# Patient Record
Sex: Female | Born: 1955 | Race: White | Hispanic: No | Marital: Married | Smoking: Never smoker
Health system: Southern US, Community
[De-identification: ages and names within clinical notes are randomized; demographics above are authoritative.]

## PROBLEM LIST (undated history)

## (undated) DIAGNOSIS — Z8619 Personal history of other infectious and parasitic diseases: Secondary | ICD-10-CM

## (undated) DIAGNOSIS — E7800 Pure hypercholesterolemia, unspecified: Secondary | ICD-10-CM

## (undated) DIAGNOSIS — C719 Malignant neoplasm of brain, unspecified: Secondary | ICD-10-CM

## (undated) DIAGNOSIS — J45909 Unspecified asthma, uncomplicated: Secondary | ICD-10-CM

## (undated) HISTORY — PX: LAPAROSCOPY: SHX197

## (undated) HISTORY — PX: FOOT SURGERY: SHX648

## (undated) HISTORY — DX: Personal history of other infectious and parasitic diseases: Z86.19

---

## 1982-02-02 HISTORY — PX: TUBAL LIGATION: SHX77

## 2005-08-20 ENCOUNTER — Ambulatory Visit: Payer: Self-pay

## 2006-09-15 ENCOUNTER — Ambulatory Visit: Payer: Self-pay

## 2007-02-03 HISTORY — PX: OTHER SURGICAL HISTORY: SHX169

## 2007-03-19 DIAGNOSIS — E7800 Pure hypercholesterolemia, unspecified: Secondary | ICD-10-CM

## 2007-03-19 DIAGNOSIS — G4700 Insomnia, unspecified: Secondary | ICD-10-CM | POA: Insufficient documentation

## 2007-03-19 DIAGNOSIS — J309 Allergic rhinitis, unspecified: Secondary | ICD-10-CM

## 2007-03-19 DIAGNOSIS — N951 Menopausal and female climacteric states: Secondary | ICD-10-CM

## 2007-03-19 DIAGNOSIS — Z803 Family history of malignant neoplasm of breast: Secondary | ICD-10-CM

## 2007-03-19 DIAGNOSIS — K219 Gastro-esophageal reflux disease without esophagitis: Secondary | ICD-10-CM

## 2007-03-23 ENCOUNTER — Ambulatory Visit: Payer: Self-pay | Admitting: Family Medicine

## 2007-11-30 ENCOUNTER — Ambulatory Visit: Payer: Self-pay | Admitting: Family Medicine

## 2008-10-15 DIAGNOSIS — E559 Vitamin D deficiency, unspecified: Secondary | ICD-10-CM

## 2008-12-11 ENCOUNTER — Ambulatory Visit: Payer: Self-pay | Admitting: Family Medicine

## 2009-04-23 LAB — HM PAP SMEAR: HM PAP: NEGATIVE

## 2009-07-31 ENCOUNTER — Ambulatory Visit: Payer: Self-pay | Admitting: Family Medicine

## 2009-08-02 ENCOUNTER — Ambulatory Visit: Payer: Self-pay

## 2010-01-14 ENCOUNTER — Ambulatory Visit: Payer: Self-pay | Admitting: Family Medicine

## 2011-03-25 ENCOUNTER — Ambulatory Visit: Payer: Self-pay | Admitting: Family Medicine

## 2012-04-05 ENCOUNTER — Ambulatory Visit: Payer: Self-pay | Admitting: Family Medicine

## 2012-08-22 ENCOUNTER — Ambulatory Visit: Payer: Self-pay | Admitting: Gastroenterology

## 2012-09-05 ENCOUNTER — Ambulatory Visit: Payer: Self-pay | Admitting: Gastroenterology

## 2012-09-05 LAB — HM COLONOSCOPY

## 2012-09-07 ENCOUNTER — Other Ambulatory Visit: Payer: Self-pay | Admitting: Gastroenterology

## 2012-09-07 LAB — PATHOLOGY REPORT

## 2013-07-27 ENCOUNTER — Ambulatory Visit: Payer: Self-pay | Admitting: Family Medicine

## 2013-07-27 LAB — HM MAMMOGRAPHY

## 2013-10-06 LAB — LIPID PANEL
Cholesterol: 201 mg/dL — AB (ref 0–200)
HDL: 67 mg/dL (ref 35–70)
LDL Cholesterol: 115 mg/dL
Triglycerides: 95 mg/dL (ref 40–160)

## 2013-10-06 LAB — HEPATIC FUNCTION PANEL
ALT: 27 U/L (ref 7–35)
AST: 24 U/L (ref 13–35)

## 2013-10-06 LAB — TSH: TSH: 1.66 u[IU]/mL (ref 0.41–5.90)

## 2014-05-12 ENCOUNTER — Emergency Department
Admit: 2014-05-12 | Disposition: A | Discharge status: Home / Self Care | Payer: Self-pay | Admitting: Physician Assistant

## 2014-06-21 ENCOUNTER — Other Ambulatory Visit: Payer: Self-pay | Admitting: Orthopedic Surgery

## 2014-06-21 DIAGNOSIS — G8929 Other chronic pain: Secondary | ICD-10-CM

## 2014-06-21 DIAGNOSIS — M2391 Unspecified internal derangement of right knee: Secondary | ICD-10-CM

## 2014-06-21 DIAGNOSIS — M25561 Pain in right knee: Principal | ICD-10-CM

## 2014-07-03 ENCOUNTER — Ambulatory Visit
Admission: RE | Admit: 2014-07-03 | Discharge: 2014-07-03 | Disposition: A | Discharge status: Home / Self Care | Payer: No Typology Code available for payment source | Source: Ambulatory Visit | Attending: Orthopedic Surgery | Admitting: Orthopedic Surgery

## 2014-07-03 DIAGNOSIS — M19071 Primary osteoarthritis, right ankle and foot: Secondary | ICD-10-CM | POA: Diagnosis not present

## 2014-07-03 DIAGNOSIS — M25561 Pain in right knee: Secondary | ICD-10-CM

## 2014-07-03 DIAGNOSIS — S83271A Complex tear of lateral meniscus, current injury, right knee, initial encounter: Secondary | ICD-10-CM | POA: Insufficient documentation

## 2014-07-03 DIAGNOSIS — M2391 Unspecified internal derangement of right knee: Secondary | ICD-10-CM

## 2014-07-03 DIAGNOSIS — M6751 Plica syndrome, right knee: Secondary | ICD-10-CM | POA: Insufficient documentation

## 2014-07-03 DIAGNOSIS — X58XXXA Exposure to other specified factors, initial encounter: Secondary | ICD-10-CM | POA: Insufficient documentation

## 2014-07-03 DIAGNOSIS — G8929 Other chronic pain: Secondary | ICD-10-CM

## 2014-07-06 ENCOUNTER — Other Ambulatory Visit: Payer: Self-pay | Admitting: *Deleted

## 2014-07-06 NOTE — Telephone Encounter (Signed)
Refill request for bupropion 300mg  Last filled by MD on- 03/09/2013 #90 x3 refills Last Appt: 03/22/2014 Next Appt: none Please advise refill? Bupropion is in pt's med list.

## 2014-07-08 MED ORDER — OMEPRAZOLE 20 MG PO CPDR: 20.0000 mg | DELAYED_RELEASE_CAPSULE | Freq: Every day | ORAL | Status: AC

## 2014-07-08 MED ORDER — ROSUVASTATIN CALCIUM 20 MG PO TABS: 20.0000 mg | ORAL_TABLET | Freq: Every day | ORAL | Status: DC

## 2014-07-08 MED ORDER — BUPROPION HCL ER (XL) 300 MG PO TB24: 300.0000 mg | ORAL_TABLET | Freq: Every day | ORAL | Status: AC

## 2014-07-25 ENCOUNTER — Telehealth: Payer: Self-pay | Admitting: Family Medicine

## 2014-07-25 NOTE — Telephone Encounter (Signed)
Recommend she change to atorvastatin (generic Lipitor) 40mg  one daily. If OK with patient can send rx for #90 with 1 refills to [facility].

## 2014-07-25 NOTE — Telephone Encounter (Signed)
Pt states [facility] is sending a refill request for Crestor 20mg .  Pt states the cost is going to $275.00 for 3 months (for name brand or generic) and she can not pay that.  Pt is requesting something different to be refilled that will cost less.  [facility] mail order.  CB#[phone]/MJ

## 2014-07-26 NOTE — Telephone Encounter (Signed)
Left message for pt to return call.

## 2014-07-27 MED ORDER — ATORVASTATIN CALCIUM 40 MG PO TABS: 40.0000 mg | ORAL_TABLET | Freq: Every day | ORAL | Status: DC

## 2014-07-27 NOTE — Telephone Encounter (Signed)
New rx sent to pharmacy

## 2014-07-27 NOTE — Telephone Encounter (Signed)
Pt stated we called her husband and she was calling back. I corrected pt's number and advised her of below and she would like the new RX sent to mail order and will call back if there is a problem. Thanks TNP

## 2014-08-02 ENCOUNTER — Other Ambulatory Visit: Payer: Self-pay | Admitting: Family Medicine

## 2014-08-02 DIAGNOSIS — J309 Allergic rhinitis, unspecified: Secondary | ICD-10-CM

## 2014-10-09 ENCOUNTER — Other Ambulatory Visit: Payer: Self-pay | Admitting: Family Medicine

## 2014-10-09 NOTE — Telephone Encounter (Signed)
Please call in alprazolam.  

## 2014-10-09 NOTE — Telephone Encounter (Signed)
Rx called in to pharmacy. 

## 2014-10-11 ENCOUNTER — Other Ambulatory Visit: Payer: Self-pay | Admitting: Family Medicine

## 2014-10-11 DIAGNOSIS — J329 Chronic sinusitis, unspecified: Secondary | ICD-10-CM

## 2014-10-12 ENCOUNTER — Other Ambulatory Visit: Payer: Self-pay | Admitting: *Deleted

## 2014-10-12 MED ORDER — BUSPIRONE HCL 5 MG PO TABS: 5.0000 mg | ORAL_TABLET | Freq: Three times a day (TID) | ORAL | Status: DC

## 2014-10-12 NOTE — Telephone Encounter (Signed)
Refill request for Buspirone 15 mg Last filled by MD on- 12/05/2013 #90 x3 Last Appt: 03/22/2014 Next Appt: none Please advise refill?

## 2014-10-19 ENCOUNTER — Other Ambulatory Visit: Payer: Self-pay | Admitting: *Deleted

## 2014-10-19 MED ORDER — BUSPIRONE HCL 5 MG PO TABS: 5.0000 mg | ORAL_TABLET | Freq: Three times a day (TID) | ORAL | Status: DC

## 2014-10-19 NOTE — Telephone Encounter (Signed)
Received fax requesting 90 day supply of buspirone HCL.

## 2014-10-29 ENCOUNTER — Telehealth: Payer: Self-pay | Admitting: Family Medicine

## 2014-10-29 NOTE — Telephone Encounter (Signed)
Pt sent e-mail request for busPIRone (BUSPAR) 5 MG tablet to be refilled at [facility]. It looks like it has been sent to 2 different pharmacy this month. Thanks TNP

## 2014-10-29 NOTE — Telephone Encounter (Signed)
Tried calling patient. No answer. Left message to call back. Looks like we sent in a 90 day supply on 10/19/2014 with an additional refill. Patient is not due for refill yet.

## 2014-10-30 NOTE — Telephone Encounter (Signed)
Pt returned call. Pt stated that she did pick up the busPIRone (BUSPAR) 5 MG tablet from [facility]. Pt stated she kept requesting it because she didn't know where it had been sent. Thanks TNP

## 2014-10-30 NOTE — Telephone Encounter (Signed)
Pt is returning a call back.  CB#[phone]/MW

## 2014-10-30 NOTE — Telephone Encounter (Signed)
Returned call, left message for pt to cb.

## 2014-12-10 ENCOUNTER — Other Ambulatory Visit: Payer: Self-pay | Admitting: Family Medicine

## 2014-12-10 NOTE — Telephone Encounter (Signed)
Please call in lorazepam.  

## 2014-12-10 NOTE — Telephone Encounter (Signed)
Rx called in to pharmacy. 

## 2015-01-16 ENCOUNTER — Other Ambulatory Visit: Payer: Self-pay | Admitting: Family Medicine

## 2015-01-17 NOTE — Telephone Encounter (Signed)
Please advise patient we sent 1 refill for atorvastatin to her mail order, but she is due for follow up o.v. And labs and needs to schedule within the next month or two.

## 2015-01-31 ENCOUNTER — Other Ambulatory Visit: Payer: Self-pay | Admitting: Family Medicine

## 2015-02-21 DIAGNOSIS — M545 Low back pain, unspecified: Secondary | ICD-10-CM | POA: Insufficient documentation

## 2015-02-21 DIAGNOSIS — F32A Depression, unspecified: Secondary | ICD-10-CM | POA: Insufficient documentation

## 2015-02-21 DIAGNOSIS — F329 Major depressive disorder, single episode, unspecified: Secondary | ICD-10-CM | POA: Insufficient documentation

## 2015-02-21 DIAGNOSIS — L219 Seborrheic dermatitis, unspecified: Secondary | ICD-10-CM | POA: Insufficient documentation

## 2015-02-21 DIAGNOSIS — F419 Anxiety disorder, unspecified: Secondary | ICD-10-CM | POA: Insufficient documentation

## 2015-02-22 ENCOUNTER — Ambulatory Visit (INDEPENDENT_AMBULATORY_CARE_PROVIDER_SITE_OTHER): Payer: BLUE CROSS/BLUE SHIELD | Admitting: Family Medicine

## 2015-02-22 ENCOUNTER — Encounter: Payer: Self-pay | Admitting: Family Medicine

## 2015-02-22 VITALS — BP 138/88 | HR 72 | Temp 98.3°F | Resp 18 | Wt 186.0 lb

## 2015-02-22 DIAGNOSIS — J0100 Acute maxillary sinusitis, unspecified: Secondary | ICD-10-CM | POA: Diagnosis not present

## 2015-02-22 MED ORDER — AMOXICILLIN-POT CLAVULANATE 875-125 MG PO TABS: 1.0000 | ORAL_TABLET | Freq: Two times a day (BID) | ORAL | Status: DC

## 2015-02-22 MED ORDER — HYDROCODONE-HOMATROPINE 5-1.5 MG/5ML PO SYRP: ORAL_SOLUTION | ORAL | Status: DC

## 2015-02-22 NOTE — Patient Instructions (Signed)
Continue Tylenol sinus for congestion.

## 2015-02-22 NOTE — Progress Notes (Signed)
Subjective:     Patient ID: Jessica [name], female   DOB: May 22, 1955, 60 y.o.   MRN: [id number]  HPI  Chief Complaint  Patient presents with  . Nasal Congestion    Patient states she has had cold symptoms for 3 weeks off and on. Symptoms include currently post nasal drainage, nasal congestion, sore throat, headache, sinus pressure, dry cough with very small productivity of phlegm, some SOB. No fever or chills. She has taking OTC medications for congestion and Tylenol sinuses.  . Cough  States congestion is locked in and little is draining.   Review of Systems     Objective:   Physical Exam  Constitutional: She appears well-developed and well-nourished. No distress.  Ears: T.M's intact without inflammation Sinuses: mild maxillary sinus tenderness Throat: no tonsillar enlargement or exudate Neck: no cervical adenopathy Lungs: clear     Assessment:    1. Acute maxillary sinusitis, recurrence not specified - HYDROcodone-homatropine (HYCODAN) 5-1.5 MG/5ML syrup; 5 ml 4-6 hours as needed for cough  Dispense: 240 mL; Refill: 0 - amoxicillin-clavulanate (AUGMENTIN) 875-125 MG tablet; Take 1 tablet by mouth 2 (two) times daily.  Dispense: 20 tablet; Refill: 0    Plan:    Continue otc decongestants.

## 2015-02-27 ENCOUNTER — Other Ambulatory Visit: Payer: Self-pay | Admitting: Family Medicine

## 2015-02-27 ENCOUNTER — Telehealth: Payer: Self-pay | Admitting: Family Medicine

## 2015-02-27 DIAGNOSIS — J0100 Acute maxillary sinusitis, unspecified: Secondary | ICD-10-CM

## 2015-02-27 MED ORDER — CEFDINIR 300 MG PO CAPS: 300.0000 mg | ORAL_CAPSULE | Freq: Two times a day (BID) | ORAL | Status: DC

## 2015-02-27 NOTE — Telephone Encounter (Signed)
Advised patient as stated below. KW

## 2015-02-27 NOTE — Telephone Encounter (Signed)
I have sent in cefdinir antibiotic and have noted the reaction to Augmentin in her chart.

## 2015-02-27 NOTE — Telephone Encounter (Signed)
Pt states she was in on Friday and rec'd there Rx amoxicillin-clavulanate (AUGMENTIN) 875-125 MG tablet.  Pt states the Rx has made her sick.  Pt states she took 1 on Friday night and Saturday morning.  Pt was having nausea Saturday morning.  Pt states she started throwing up Saturday afternoon until Monday morning.  Pt says this medication has made her feel like this before.  Pt is requesting a different Rx.  Pt is still having sinus congestion and drainage.  [facility].  [phone]

## 2015-03-01 ENCOUNTER — Encounter: Payer: Self-pay | Admitting: Family Medicine

## 2015-03-01 ENCOUNTER — Ambulatory Visit (INDEPENDENT_AMBULATORY_CARE_PROVIDER_SITE_OTHER): Payer: BLUE CROSS/BLUE SHIELD | Admitting: Family Medicine

## 2015-03-01 VITALS — BP 126/72 | HR 68 | Temp 98.0°F | Resp 18 | Ht 65.0 in | Wt 179.0 lb

## 2015-03-01 DIAGNOSIS — F419 Anxiety disorder, unspecified: Secondary | ICD-10-CM | POA: Diagnosis not present

## 2015-03-01 DIAGNOSIS — R319 Hematuria, unspecified: Secondary | ICD-10-CM | POA: Diagnosis not present

## 2015-03-01 DIAGNOSIS — E7800 Pure hypercholesterolemia, unspecified: Secondary | ICD-10-CM

## 2015-03-01 DIAGNOSIS — G4700 Insomnia, unspecified: Secondary | ICD-10-CM

## 2015-03-01 DIAGNOSIS — Z0000 Encounter for general adult medical examination without abnormal findings: Secondary | ICD-10-CM

## 2015-03-01 DIAGNOSIS — F329 Major depressive disorder, single episode, unspecified: Secondary | ICD-10-CM

## 2015-03-01 DIAGNOSIS — F32A Depression, unspecified: Secondary | ICD-10-CM

## 2015-03-01 LAB — POCT URINALYSIS DIPSTICK
BILIRUBIN UA: NEGATIVE
GLUCOSE UA: NEGATIVE
Ketones, UA: NEGATIVE
LEUKOCYTES UA: NEGATIVE
NITRITE UA: NEGATIVE
Protein, UA: NEGATIVE
Spec Grav, UA: 1.02
Urobilinogen, UA: 0.2
pH, UA: 6

## 2015-03-01 MED ORDER — BUSPIRONE HCL 5 MG PO TABS: 5.0000 mg | ORAL_TABLET | Freq: Three times a day (TID) | ORAL | Status: DC

## 2015-03-01 MED ORDER — ZOLPIDEM TARTRATE 5 MG PO TABS: 5.0000 mg | ORAL_TABLET | Freq: Every evening | ORAL | Status: DC | PRN

## 2015-03-01 MED ORDER — BUSPIRONE HCL 5 MG PO TABS: 5.0000 mg | ORAL_TABLET | Freq: Three times a day (TID) | ORAL | Status: AC

## 2015-03-01 NOTE — Progress Notes (Signed)
Patient ID: Jessica [name], female   DOB: 29-Dec-1955, 60 y.o.   MRN: [id number]       Patient: Jessica [name], Female    DOB: 1955-04-06, 60 y.o.   MRN: [id number] Visit Date: 03/01/2015  Today's Provider: Lelon Huh, MD   Chief Complaint  Patient presents with  . Annual Exam   Subjective:    Annual physical exam Jessica [name] is a 60 y.o. female who presents today for health maintenance and complete physical. She feels fairly well. She reports exercising not regularly. She reports she is sleeping fairly well. She sleeps on average 6 hours a night.    Depression, Follow-up  She  was last seen for this 1 years ago. Changes made at last visit include decreasing Buspar from 20mg  to 15mg , and take 15mg  TID instead of BID.   She reports good compliance with treatment. She is not having side effects.   She reports good tolerance of treatment. Current symptoms include: none She feels she is Unchanged since last visit.   Lipid/Cholesterol, Follow-up:   Last seen for this1 years ago.  Management changes since that visit include no changes. . Last Lipid Panel:    Component Value Date/Time   CHOL 201* 10/06/2013   TRIG 95 10/06/2013   HDL 67 10/06/2013   LDLCALC 115 10/06/2013    She reports good compliance with treatment. She is not having side effects.  Weight trend: stable Prior visit with dietician: no Current diet: well balanced Current exercise: none  Wt Readings from Last 3 Encounters:  03/01/15 179 lb (81.194 kg)  02/22/15 186 lb (84.369 kg)  03/22/14 190 lb (86.183 kg)    ------------------------------------------------------------------- Insomnia, Follow up:  On last visit (12/05/2013) Alprazolam was D/C and patient was started on Lorazepam 1mg  at bedtime. Patient reports that she thinks the medication is keeping her up at night. She has never Ambien, but is interested.   URI symptoms: Patient reports that she is currently on an abx due to having URI  symptoms. She reports that she just stared Cefdinir 2 days ago. She reports that symptoms are better, but she is not 100%. Patient is requesting something to help with the PND she is having.   Review of Systems  Constitutional: Negative.   HENT: Positive for congestion, postnasal drip and sinus pressure.   Eyes: Negative.   Respiratory: Positive for cough.   Cardiovascular: Negative.   Gastrointestinal: Negative.   Endocrine: Negative.   Genitourinary: Negative.   Musculoskeletal: Negative.   Skin: Negative.   Allergic/Immunologic: Negative.   Neurological: Negative.   Hematological: Negative.   Psychiatric/Behavioral: Positive for sleep disturbance.    Social History      She  reports that she has never smoked. She has never used smokeless tobacco. She reports that she drinks alcohol. She reports that she does not use illicit drugs.       Social History   Social History  . Marital Status: Married    Spouse Name: N/A  . Number of Children: 2  . Years of Education: N/A   Occupational History  . Manager     [employer]   Social History Main Topics  . Smoking status: Never Smoker   . Smokeless tobacco: Never Used  . Alcohol Use: 0.0 oz/week    0 Standard drinks or equivalent per week     Comment: occasional use  . Drug Use: No  . Sexual Activity: Not Asked   Other Topics Concern  .  None   Social History Narrative    Past Medical History  Diagnosis Date  . History of chicken pox   . History of measles   . History of mumps   . Hyperlipidemia   . Vitamin D deficiency   . Depression   . Anxiety   . Allergy   . GERD (gastroesophageal reflux disease)      Patient Active Problem List   Diagnosis Date Noted  . Anxiety 02/21/2015  . Depression 02/21/2015  . 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 Vitamin D deficiency 10/15/2008  . Allergic rhinitis 03/19/2007  . Esophageal reflux 03/19/2007  . Family history of breast cancer 03/19/2007  .  Hypercholesteremia 03/19/2007  . Insomnia 03/19/2007  . Menopausal symptom 03/19/2007    Past Surgical History  Procedure Laterality Date  . Tubal ligation  1984  . Laparoscopy      abdominal endometriosis  . Ct scan of head  2009    negative  . Foot surgery      Family History        Family Status  Relation Status Death Age  . Mother Alive   . Father Alive   . Sister Deceased 40    unknown cause. MVA age 71, major fractures in pelvis and legs. OA  . Brother Alive   . Sister Alive         Her family history includes Breast cancer in her mother; Depression in her mother; Diabetes in her father; Hyperlipidemia in her mother; Hypertension in her brother, father, mother, and sister; Hypothyroidism in her mother.    Allergies  Allergen Reactions  . Augmentin [Amoxicillin-Pot Clavulanate] Nausea And Vomiting  . Compazine  [Prochlorperazine]     swelling, SOB.    Previous Medications   ATORVASTATIN (LIPITOR) 40 MG TABLET    TAKE 1 TABLET DAILY   BUPROPION (WELLBUTRIN XL) 300 MG 24 HR TABLET    Take 1 tablet (300 mg total) by mouth daily.   BUSPIRONE (BUSPAR) 5 MG TABLET    Take 1 tablet (5 mg total) by mouth 3 (three) times daily.   CEFDINIR (OMNICEF) 300 MG CAPSULE    Take 1 capsule (300 mg total) by mouth 2 (two) times daily.   FLUTICASONE (FLONASE) 50 MCG/ACT NASAL SPRAY    SHAKE LIQUID AND USE 2 SPRAYS IN EACH NOSTRIL DAILY   HYDROCODONE-HOMATROPINE (HYCODAN) 5-1.5 MG/5ML SYRUP    5 ml 4-6 hours as needed for cough   LORAZEPAM (ATIVAN) 1 MG TABLET    TAKE ONE TABLET AT BEDTIME   OMEPRAZOLE (PRILOSEC) 20 MG CAPSULE    Take 1 capsule (20 mg total) by mouth daily.   VITAMIN D, CHOLECALCIFEROL, 1000 UNITS TABS    Take by mouth.    Patient Care Team: Birdie Sons, MD as PCP - General (Family Medicine) Historical Provider, MD Barrie Dunker, MD (Dermatology)     Objective:   Vitals: Ht 5\' 5"  (1.651 m)  Wt 179 lb (81.194 kg)  BMI 29.79 kg/m2   Physical  Exam  General Appearance:    Alert, cooperative, no distress, appears stated age  Head:    Normocephalic, without obvious abnormality, atraumatic  Eyes:    PERRL, conjunctiva/corneas clear, EOM's intact, fundi    benign, both eyes  Ears:    Normal TM's and external ear canals, both ears  Nose:   Nares normal, septum midline, mucosa normal, no drainage    or sinus tenderness  Throat:   Lips, mucosa, and tongue normal;  teeth and gums normal  Neck:   Supple, symmetrical, trachea midline, no adenopathy;    thyroid:  no enlargement/tenderness/nodules; no carotid   bruit or JVD  Back:     Symmetric, no curvature, ROM normal, no CVA tenderness  Lungs:     Clear to auscultation bilaterally, respirations unlabored  Chest Wall:    No tenderness or deformity   Heart:    Regular rate and rhythm, S1 and S2 normal, no murmur, rub   or gallop  Breast Exam:    normal appearance, no masses or tenderness  Abdomen:     Soft, non-tender, bowel sounds active all four quadrants,    no masses, no organomegaly  Pelvic:    deferred  Extremities:   Extremities normal, atraumatic, no cyanosis or edema  Pulses:   2+ and symmetric all extremities  Skin:   Skin color, texture, turgor normal, no rashes or lesions  Lymph nodes:   Cervical, supraclavicular, and axillary nodes normal  Neurologic:   CNII-XII intact, normal strength, sensation and reflexes    throughout      Assessment & Plan:     Routine Health Maintenance and Physical Exam  Exercise Activities and Dietary recommendations Goals    None      Immunization History  Administered Date(s) Administered  . Influenza-Unspecified 12/04/2014  . Tdap 04/10/2008    Health Maintenance  Topic Date Due  . HIV Screening  08/01/1969  . PAP SMEAR  04/23/2012  . ZOSTAVAX  08/02/2014  . MAMMOGRAM  07/28/2015  . INFLUENZA VACCINE  09/03/2015  . TETANUS/TDAP  04/11/2018  . COLONOSCOPY  09/06/2022  . Hepatitis C Screening  Completed       Discussed health benefits of physical activity, and encouraged her to engage in regular exercise appropriate for her age and condition.    --------------------------------------------------------------------  1. Annual physical exam Counseled on diet and exercise to improve overall health She was given prescription for Zostavax since we have none in stock in the office.   2. Anxiety Doing well with buspirone which is refilled today - CBC with Differential/Platelet - TSH  3. Depression Continue Wellbutrin - CBC with Differential/Platelet  4. Hypercholesteremia She is tolerating atorvastatin well with no adverse effects.   - Lipid panel - CBC with Differential/Platelet - Basic Metabolic Panel (BMET)  5. Insomnia Try change lorazepam to Ambien  - TSH - zolpidem (AMBIEN) 5 MG tablet; Take 1 tablet (5 mg total) by mouth at bedtime as needed (insomnia).  Dispense: 20 tablet; Refill: 1  6. Hematuria She complains of frequent urination and urgency, but u/a shows only RBCs. Check Culture.   - POCT urinalysis dipstick - Urine Culture

## 2015-03-02 LAB — LIPID PANEL
CHOL/HDL RATIO: 3.9 ratio (ref 0.0–4.4)
Cholesterol, Total: 178 mg/dL (ref 100–199)
HDL: 46 mg/dL (ref >39)
LDL CALC: 106 mg/dL — AB (ref 0–99)
Triglycerides: 131 mg/dL (ref 0–149)
VLDL CHOLESTEROL CAL: 26 mg/dL (ref 5–40)

## 2015-03-02 LAB — TSH: TSH: 1.11 u[IU]/mL (ref 0.450–4.500)

## 2015-03-02 LAB — CBC WITH DIFFERENTIAL/PLATELET
BASOS: 0 %
Basophils Absolute: 0 10*3/uL (ref 0.0–0.2)
EOS (ABSOLUTE): 0.1 10*3/uL (ref 0.0–0.4)
EOS: 2 %
HEMATOCRIT: 42.9 % (ref 34.0–46.6)
HEMOGLOBIN: 14.6 g/dL (ref 11.1–15.9)
Immature Grans (Abs): 0 10*3/uL (ref 0.0–0.1)
Immature Granulocytes: 0 %
LYMPHS ABS: 1.8 10*3/uL (ref 0.7–3.1)
Lymphs: 29 %
MCH: 29.9 pg (ref 26.6–33.0)
MCHC: 34 g/dL (ref 31.5–35.7)
MCV: 88 fL (ref 79–97)
MONOCYTES: 7 %
Monocytes Absolute: 0.4 10*3/uL (ref 0.1–0.9)
NEUTROS ABS: 3.8 10*3/uL (ref 1.4–7.0)
Neutrophils: 62 %
Platelets: 292 10*3/uL (ref 150–379)
RBC: 4.89 x10E6/uL (ref 3.77–5.28)
RDW: 13.5 % (ref 12.3–15.4)
WBC: 6.1 10*3/uL (ref 3.4–10.8)

## 2015-03-02 LAB — URINE CULTURE: ORGANISM ID, BACTERIA: NO GROWTH

## 2015-03-02 LAB — BASIC METABOLIC PANEL
BUN / CREAT RATIO: 7 — AB (ref 11–26)
BUN: 6 mg/dL — AB (ref 8–27)
CO2: 25 mmol/L (ref 18–29)
CREATININE: 0.81 mg/dL (ref 0.57–1.00)
Calcium: 9.8 mg/dL (ref 8.7–10.3)
Chloride: 99 mmol/L (ref 96–106)
GFR calc Af Amer: 91 mL/min/{1.73_m2} (ref >59)
GFR calc non Af Amer: 79 mL/min/{1.73_m2} (ref >59)
GLUCOSE: 96 mg/dL (ref 65–99)
Potassium: 4.3 mmol/L (ref 3.5–5.2)
Sodium: 142 mmol/L (ref 134–144)

## 2015-03-13 ENCOUNTER — Other Ambulatory Visit: Payer: Self-pay

## 2015-03-13 DIAGNOSIS — R319 Hematuria, unspecified: Secondary | ICD-10-CM

## 2015-03-14 LAB — URINALYSIS
Bilirubin, UA: NEGATIVE
GLUCOSE, UA: NEGATIVE
KETONES UA: NEGATIVE
Nitrite, UA: NEGATIVE
Protein, UA: NEGATIVE
RBC, UA: NEGATIVE
Specific Gravity, UA: 1.014 (ref 1.005–1.030)
UUROB: 0.2 mg/dL (ref 0.2–1.0)
pH, UA: 6 (ref 5.0–7.5)

## 2015-03-15 LAB — URINALYSIS, MICROSCOPIC ONLY
Bacteria, UA: NONE SEEN
Casts: NONE SEEN /lpf
WBC UA: NONE SEEN /HPF (ref 0–?)

## 2015-03-28 ENCOUNTER — Other Ambulatory Visit: Payer: Self-pay | Admitting: Family Medicine

## 2015-03-28 ENCOUNTER — Other Ambulatory Visit: Payer: Self-pay | Admitting: *Deleted

## 2015-03-28 DIAGNOSIS — G4700 Insomnia, unspecified: Secondary | ICD-10-CM

## 2015-03-28 MED ORDER — ZOLPIDEM TARTRATE 5 MG PO TABS: 5.0000 mg | ORAL_TABLET | Freq: Every evening | ORAL | Status: DC | PRN

## 2015-03-28 MED ORDER — ZOLPIDEM TARTRATE 5 MG PO TABS
5.0000 mg | ORAL_TABLET | Freq: Every evening | ORAL | Status: DC | PRN
Start: 2015-03-28 — End: 2015-04-16

## 2015-04-11 ENCOUNTER — Other Ambulatory Visit: Payer: Self-pay | Admitting: Family Medicine

## 2015-04-11 DIAGNOSIS — G4700 Insomnia, unspecified: Secondary | ICD-10-CM

## 2015-04-11 NOTE — Telephone Encounter (Signed)
Pt states the pharmacy has told her the Rx zolpidem (AMBIEN) 5 MG tablet is not covered and will need a prior auth.  The Rx is with [facility].  Phone [phone].  Pt only has 6/7 pills left.  CB#[phone]/MW

## 2015-04-11 NOTE — Telephone Encounter (Signed)
PA started

## 2015-04-12 ENCOUNTER — Ambulatory Visit (INDEPENDENT_AMBULATORY_CARE_PROVIDER_SITE_OTHER): Payer: BLUE CROSS/BLUE SHIELD | Admitting: Family Medicine

## 2015-04-12 DIAGNOSIS — Z23 Encounter for immunization: Secondary | ICD-10-CM

## 2015-04-15 NOTE — Telephone Encounter (Signed)
Patient called wanting an update on this PA. I advised her that the PA had already been started. Have you heard back from Insurance on whether PA had been approved?

## 2015-04-16 NOTE — Telephone Encounter (Signed)
Pt called to check on PA.  I spoke to [place] and she said Jessica [name] was working on the [place].  Patient said she may run out and would like to know what she should do if she runs out before it gets approved?  Please advise.  Her call back is [phone]   Thanks Con Memos

## 2015-04-16 NOTE — Telephone Encounter (Signed)
Spoke with patient. She states she will run out of medication on Friday night. Sharyn Lull, have you heard if this medication has been approved?

## 2015-04-16 NOTE — Telephone Encounter (Signed)
PA approved for zolpidem. Approval [id number]. [facility] day supply. Pt is requesting an rx also be [facility], if not a months supply, then enough to last her until [facility]. Patient will be out of medication by Friday.

## 2015-04-18 MED ORDER — ZOLPIDEM TARTRATE 5 MG PO TABS
5.0000 mg | ORAL_TABLET | Freq: Every evening | ORAL | Status: DC | PRN
Start: 2015-04-18 — End: 2015-05-17

## 2015-04-18 NOTE — Telephone Encounter (Signed)
Rx called into pharmacy. Patient was notified.   

## 2015-04-18 NOTE — Telephone Encounter (Signed)
Please call in 30 day rx to [facility]

## 2015-05-17 ENCOUNTER — Other Ambulatory Visit: Payer: Self-pay | Admitting: Family Medicine

## 2015-05-17 NOTE — Telephone Encounter (Signed)
Please call in alprazolam.  

## 2015-05-18 NOTE — Telephone Encounter (Signed)
Please call in zolpidem  

## 2015-05-20 NOTE — Telephone Encounter (Signed)
Rx called in to pharmacy. 

## 2015-05-29 ENCOUNTER — Other Ambulatory Visit: Payer: Self-pay | Admitting: Internal Medicine

## 2015-05-29 DIAGNOSIS — Z1231 Encounter for screening mammogram for malignant neoplasm of breast: Secondary | ICD-10-CM

## 2015-06-10 ENCOUNTER — Ambulatory Visit: Payer: No Typology Code available for payment source

## 2015-06-12 ENCOUNTER — Ambulatory Visit
Admission: RE | Admit: 2015-06-12 | Discharge: 2015-06-12 | Disposition: A | Discharge status: Home / Self Care | Payer: BLUE CROSS/BLUE SHIELD | Source: Ambulatory Visit | Attending: Internal Medicine | Admitting: Internal Medicine

## 2015-06-12 DIAGNOSIS — Z1231 Encounter for screening mammogram for malignant neoplasm of breast: Secondary | ICD-10-CM | POA: Diagnosis present

## 2016-06-18 ENCOUNTER — Encounter: Payer: Self-pay | Admitting: Emergency Medicine

## 2016-06-18 ENCOUNTER — Emergency Department: Payer: BLUE CROSS/BLUE SHIELD

## 2016-06-18 ENCOUNTER — Emergency Department
Admission: EM | Admit: 2016-06-18 | Discharge: 2016-06-19 | Disposition: A | Discharge status: Other Acute Inpatient Hospital | Payer: BLUE CROSS/BLUE SHIELD | Attending: Emergency Medicine | Admitting: Emergency Medicine

## 2016-06-18 DIAGNOSIS — J45909 Unspecified asthma, uncomplicated: Secondary | ICD-10-CM | POA: Insufficient documentation

## 2016-06-18 DIAGNOSIS — Z79899 Other long term (current) drug therapy: Secondary | ICD-10-CM | POA: Diagnosis not present

## 2016-06-18 DIAGNOSIS — R110 Nausea: Secondary | ICD-10-CM | POA: Insufficient documentation

## 2016-06-18 DIAGNOSIS — D432 Neoplasm of uncertain behavior of brain, unspecified: Secondary | ICD-10-CM | POA: Insufficient documentation

## 2016-06-18 DIAGNOSIS — Z7902 Long term (current) use of antithrombotics/antiplatelets: Secondary | ICD-10-CM | POA: Insufficient documentation

## 2016-06-18 DIAGNOSIS — R51 Headache: Secondary | ICD-10-CM | POA: Diagnosis present

## 2016-06-18 DIAGNOSIS — C719 Malignant neoplasm of brain, unspecified: Secondary | ICD-10-CM

## 2016-06-18 HISTORY — DX: Pure hypercholesterolemia, unspecified: E78.00

## 2016-06-18 HISTORY — DX: Unspecified asthma, uncomplicated: J45.909

## 2016-06-18 LAB — COMPREHENSIVE METABOLIC PANEL
ALBUMIN: 4.4 g/dL (ref 3.5–5.0)
ALT: 22 U/L (ref 14–54)
AST: 20 U/L (ref 15–41)
Alkaline Phosphatase: 110 U/L (ref 38–126)
Anion gap: 9 (ref 5–15)
BUN: 9 mg/dL (ref 6–20)
CO2: 29 mmol/L (ref 22–32)
Calcium: 9.3 mg/dL (ref 8.9–10.3)
Chloride: 102 mmol/L (ref 101–111)
Creatinine, Ser: 0.95 mg/dL (ref 0.44–1.00)
GFR calc Af Amer: >60 mL/min (ref >60)
GFR calc non Af Amer: >60 mL/min (ref >60)
GLUCOSE: 105 mg/dL — AB (ref 65–99)
POTASSIUM: 3.7 mmol/L (ref 3.5–5.1)
Sodium: 140 mmol/L (ref 135–145)
Total Bilirubin: 1.5 mg/dL — ABNORMAL HIGH (ref 0.3–1.2)
Total Protein: 7.4 g/dL (ref 6.5–8.1)

## 2016-06-18 LAB — CBC WITH DIFFERENTIAL/PLATELET
Basophils Absolute: 0.1 10*3/uL (ref 0–0.1)
Basophils Relative: 1 %
EOS PCT: 0 %
Eosinophils Absolute: 0 10*3/uL (ref 0–0.7)
HEMATOCRIT: 41 % (ref 35.0–47.0)
Hemoglobin: 14 g/dL (ref 12.0–16.0)
LYMPHS ABS: 1.2 10*3/uL (ref 1.0–3.6)
LYMPHS PCT: 10 %
MCH: 29.5 pg (ref 26.0–34.0)
MCHC: 34.1 g/dL (ref 32.0–36.0)
MCV: 86.5 fL (ref 80.0–100.0)
MONO ABS: 0.4 10*3/uL (ref 0.2–0.9)
Monocytes Relative: 4 %
Neutro Abs: 10.9 10*3/uL — ABNORMAL HIGH (ref 1.4–6.5)
Neutrophils Relative %: 85 %
PLATELETS: 258 10*3/uL (ref 150–440)
RBC: 4.74 MIL/uL (ref 3.80–5.20)
RDW: 13.4 % (ref 11.5–14.5)
WBC: 12.6 10*3/uL — AB (ref 3.6–11.0)

## 2016-06-18 LAB — PROTIME-INR
INR: 1.08
Prothrombin Time: 14 seconds (ref 11.4–15.2)

## 2016-06-18 MED ORDER — DEXAMETHASONE SODIUM PHOSPHATE 10 MG/ML IJ SOLN
10.0000 mg | Freq: Once | INTRAMUSCULAR | Status: AC
  Administered 2016-06-18: 10 mg via INTRAVENOUS
  Filled 2016-06-18 (×2): qty 1

## 2016-06-18 MED ORDER — DIPHENHYDRAMINE HCL 50 MG/ML IJ SOLN
50.0000 mg | Freq: Once | INTRAMUSCULAR | Status: AC
  Administered 2016-06-18: 50 mg via INTRAVENOUS
  Filled 2016-06-18: qty 1

## 2016-06-18 MED ORDER — METOCLOPRAMIDE HCL 5 MG/ML IJ SOLN
10.0000 mg | Freq: Once | INTRAMUSCULAR | Status: AC
  Administered 2016-06-18: 10 mg via INTRAVENOUS
  Filled 2016-06-18: qty 2

## 2016-06-18 MED ORDER — SODIUM CHLORIDE 0.9 % IV SOLN
1000.0000 mg | Freq: Once | INTRAVENOUS | Status: AC
  Administered 2016-06-18: 1000 mg via INTRAVENOUS
  Filled 2016-06-18: qty 10

## 2016-06-18 MED ORDER — KETOROLAC TROMETHAMINE 30 MG/ML IJ SOLN
15.0000 mg | Freq: Once | INTRAMUSCULAR | Status: AC
  Administered 2016-06-18: 15 mg via INTRAVENOUS
  Filled 2016-06-18: qty 1

## 2016-06-18 NOTE — ED Provider Notes (Signed)
[facility] Emergency Department Provider Note  ____________________________________________   First MD Initiated Contact with Patient 06/18/16 2000     (approximate)  I have reviewed the triage vital signs and the nursing notes.   HISTORY  Chief Complaint Headache and Nausea    HPI Jessica [name] is a 61 y.o. female who self presents to the emergency Department with roughly 24 hours of gradual onset not maximal onset right sided throbbing headache unlike any headache she's ever had before. She has tried Excedrin Migraine at home with no relief. She's had nausea and vomiting. She has photophobia. She's had no fevers or chills. No numbness or weakness.   Past Medical History:  Diagnosis Date  . Asthma   . Elevated cholesterol   . History of chicken pox   . History of measles   . History of mumps     Patient Active Problem List   Diagnosis Date Noted  . Anxiety 02/21/2015  . Depression 02/21/2015  . 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 Vitamin D deficiency 10/15/2008  . Allergic rhinitis 03/19/2007  . Esophageal reflux 03/19/2007  . Family history of breast cancer 03/19/2007  . Hypercholesteremia 03/19/2007  . Insomnia 03/19/2007  . Menopausal symptom 03/19/2007    Past Surgical History:  Procedure Laterality Date  . CT Scan of head  2009   negative  . FOOT SURGERY    . LAPAROSCOPY     abdominal endometriosis  . TUBAL LIGATION  1984    Prior to Admission medications   Medication Sig Start Date End Date Taking? Authorizing Provider  atorvastatin (LIPITOR) 40 MG tablet TAKE 1 TABLET DAILY 03/28/15   Birdie Sons, MD  buPROPion (WELLBUTRIN XL) 300 MG 24 hr tablet Take 1 tablet (300 mg total) by mouth daily. 07/08/14   Birdie Sons, MD  busPIRone (BUSPAR) 5 MG tablet Take 1 tablet (5 mg total) by mouth 3 (three) times daily. 03/01/15   Birdie Sons, MD  cefdinir (OMNICEF) 300 MG capsule Take 1 capsule (300 mg total) by mouth 2  (two) times daily. 02/27/15   Carmon Ginsberg, PA  fluticasone (FLONASE) 50 MCG/ACT nasal spray SHAKE LIQUID AND USE 2 SPRAYS IN [facility] NOSTRIL DAILY 01/31/15   Birdie Sons, MD  HYDROcodone-homatropine [facility]) 5-1.5 MG/5ML syrup 5 ml 4-6 hours as needed for cough 02/22/15   Carmon Ginsberg, PA  omeprazole (PRILOSEC) 20 MG capsule Take 1 capsule (20 mg total) by mouth daily. 07/08/14   Birdie Sons, MD  Vitamin D, Cholecalciferol, 1000 units TABS Take by mouth.    [provider]  zolpidem (AMBIEN) 5 MG tablet TAKE 1 TABLET BY MOUTH EVERY NIGHT AT BEDTIME AS NEEDED FOR INSOMNIA 05/18/15   Birdie Sons, MD    Allergies Augmentin [amoxicillin-pot clavulanate] and Compazine  [prochlorperazine]  Family History  Problem Relation Age of Onset  . Breast cancer Mother 53  . Depression Mother   . Hyperlipidemia Mother   . Hypothyroidism Mother   . Hypertension Mother   . Diabetes Father   . Hypertension Father   . Hypertension Brother   . Hypertension Sister     Social History Social History  Substance Use Topics  . Smoking status: Never Smoker  . Smokeless tobacco: Never Used  . Alcohol use 0.0 oz/week     Comment: occasional use    Review of Systems Constitutional: No fever/chills Eyes: No visual changes. ENT: No sore throat. Cardiovascular: Denies chest pain. Respiratory: Denies shortness of  breath. Gastrointestinal: No abdominal pain.  No nausea, no vomiting.  No diarrhea.  No constipation. Genitourinary: Negative for dysuria. Musculoskeletal: Negative for back pain. Skin: Negative for rash. Neurological: Positive for headaches   ____________________________________________   PHYSICAL EXAM:  VITAL SIGNS: ED Triage Vitals  Enc Vitals Group     BP 06/18/16 1846 131/73     Pulse Rate 06/18/16 1846 81     Resp 06/18/16 1846 18     Temp 06/18/16 1846 98.3 F (36.8 C)     Temp Source 06/18/16 1846 Oral     SpO2 06/18/16 1846 98 %     Weight 06/18/16  1847 180 lb (81.6 kg)     Height 06/18/16 1847 5\' 5"  (1.651 m)     Head Circumference --      Peak Flow --      Pain Score 06/18/16 1846 10     Pain Loc --      Pain Edu? --      Excl. in [place]? --     Constitutional: Alert and oriented x 4 Appears quite uncomfortable hiding herself under the blanket Eyes: PERRL EOMI. Head: Atraumatic. Nose: No congestion/rhinnorhea. Mouth/Throat: No trismus Neck: No stridor.   Cardiovascular: Normal rate, regular rhythm. Grossly normal heart sounds.  Good peripheral circulation. Respiratory: Normal respiratory effort.  No retractions. Lungs CTAB and moving good air Gastrointestinal: Soft nontender Musculoskeletal: No lower extremity edema   Neurologic:  Normal speech and language. No gross focal neurologic deficits are appreciated. No pronator drift 5 out of 5 grips biceps triceps hip flexion and hip extension plantar flexion dorsiflexion Skin:  Skin is warm, dry and intact. No rash noted. Psychiatric: Mood and affect are normal. Speech and behavior are normal.    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  Critical care was necessary to treat or prevent imminent or life-threatening deterioration.  Critical care was time spent personally by me on the following activities: development of treatment plan with patient and/or surrogate as well as nursing, discussions with consultants, evaluation of patient's response to treatment, examination of patient, obtaining history from patient or surrogate, ordering and performing treatments and interventions, ordering and review of laboratory studies, ordering and review of radiographic studies, pulse oximetry and re-evaluation of patient's condition.   Observation: no ____________________________________________   INITIAL IMPRESSION / ASSESSMENT AND PLAN / ED COURSE  Pertinent labs & imaging results that were available during my care of the patient were reviewed by me and considered in my medical decision making (see chart for details).  While the patient is neuro intact she is in a significant amount of discomfort within the headache. I think she warrants a CT scan of her head to evaluate for large mass versus intracerebral hemorrhage etc. Had an adverse reaction to Compazine in the past so I will treat her  with Reglan instead.     Unfortunately the patient's head CT shows a large right sided brain tumor with midline shift and vasogenic edema. The scan is concerning for glioblastoma. I discussed the case with on-call neurosurgeon Dr. Cari Caraway who recommends dexamethasone as well as Keppra and having the patient transferred to [facility] emergently tonight for further workup and evaluation. I discussed this with the patient and her husband at bedside and they both agree. She remains neuro intact at this time. ____________________________________________   FINAL CLINICAL IMPRESSION(S) / ED DIAGNOSES  Final diagnoses:  Malignant neoplasm of brain, unspecified location [facility])      NEW MEDICATIONS STARTED DURING THIS VISIT:  New  Prescriptions   No medications on file     Note:  This document was prepared using Dragon voice recognition software and may include unintentional dictation errors.     Darel Hong, MD 06/18/16 2114

## 2016-06-18 NOTE — ED Triage Notes (Signed)
Pt reports right sided headache, nausea and sensitivity to light for 24 hours. Pt denies history of migraine headache. Pt alert and oriented in triage. Facial symmetry intact, bilateral strong hand grips, no arm drift noted.

## 2016-06-18 NOTE — ED Notes (Signed)
Patient transported to CT 

## 2016-06-18 NOTE — ED Notes (Signed)
Per MD Rifenbark, let MD Dahlia Client know when trasnfer is within 1 hour of picking up pt to be trasnferred

## 2016-07-07 ENCOUNTER — Emergency Department: Payer: BLUE CROSS/BLUE SHIELD

## 2016-07-07 ENCOUNTER — Encounter: Payer: Self-pay | Admitting: Emergency Medicine

## 2016-07-07 ENCOUNTER — Emergency Department
Admission: EM | Admit: 2016-07-07 | Discharge: 2016-07-07 | Disposition: A | Discharge status: Home / Self Care | Payer: BLUE CROSS/BLUE SHIELD | Attending: Emergency Medicine | Admitting: Emergency Medicine

## 2016-07-07 DIAGNOSIS — Z9889 Other specified postprocedural states: Secondary | ICD-10-CM | POA: Diagnosis not present

## 2016-07-07 DIAGNOSIS — Z79899 Other long term (current) drug therapy: Secondary | ICD-10-CM | POA: Diagnosis not present

## 2016-07-07 DIAGNOSIS — R450 Nervousness: Secondary | ICD-10-CM | POA: Insufficient documentation

## 2016-07-07 DIAGNOSIS — R112 Nausea with vomiting, unspecified: Secondary | ICD-10-CM | POA: Insufficient documentation

## 2016-07-07 DIAGNOSIS — J45909 Unspecified asthma, uncomplicated: Secondary | ICD-10-CM | POA: Diagnosis not present

## 2016-07-07 DIAGNOSIS — D496 Neoplasm of unspecified behavior of brain: Secondary | ICD-10-CM | POA: Insufficient documentation

## 2016-07-07 DIAGNOSIS — R531 Weakness: Secondary | ICD-10-CM | POA: Insufficient documentation

## 2016-07-07 LAB — COMPREHENSIVE METABOLIC PANEL
ALBUMIN: 4.1 g/dL (ref 3.5–5.0)
ALK PHOS: 81 U/L (ref 38–126)
ALT: 19 U/L (ref 14–54)
ANION GAP: 10 (ref 5–15)
AST: 17 U/L (ref 15–41)
BUN: 19 mg/dL (ref 6–20)
CALCIUM: 9 mg/dL (ref 8.9–10.3)
CHLORIDE: 96 mmol/L — AB (ref 101–111)
CO2: 27 mmol/L (ref 22–32)
Creatinine, Ser: 0.8 mg/dL (ref 0.44–1.00)
GFR calc non Af Amer: >60 mL/min (ref >60)
GLUCOSE: 150 mg/dL — AB (ref 65–99)
Potassium: 4.1 mmol/L (ref 3.5–5.1)
SODIUM: 133 mmol/L — AB (ref 135–145)
Total Bilirubin: 2.1 mg/dL — ABNORMAL HIGH (ref 0.3–1.2)
Total Protein: 7.6 g/dL (ref 6.5–8.1)

## 2016-07-07 LAB — CBC
HCT: 45.2 % (ref 35.0–47.0)
HEMOGLOBIN: 15.9 g/dL (ref 12.0–16.0)
MCH: 30 pg (ref 26.0–34.0)
MCHC: 35.3 g/dL (ref 32.0–36.0)
MCV: 85.1 fL (ref 80.0–100.0)
Platelets: 300 10*3/uL (ref 150–440)
RBC: 5.31 MIL/uL — AB (ref 3.80–5.20)
RDW: 13.1 % (ref 11.5–14.5)
WBC: 15.4 10*3/uL — ABNORMAL HIGH (ref 3.6–11.0)

## 2016-07-07 LAB — LIPASE, BLOOD: LIPASE: 35 U/L (ref 11–51)

## 2016-07-07 MED ORDER — DIPHENHYDRAMINE HCL 50 MG/ML IJ SOLN
25.0000 mg | Freq: Once | INTRAMUSCULAR | Status: AC
  Administered 2016-07-07: 25 mg via INTRAVENOUS

## 2016-07-07 MED ORDER — DEXAMETHASONE 4 MG PO TABS: 8.0000 mg | ORAL_TABLET | Freq: Four times a day (QID) | ORAL | 0 refills | Status: AC

## 2016-07-07 MED ORDER — METOCLOPRAMIDE HCL 5 MG/ML IJ SOLN
10.0000 mg | Freq: Once | INTRAMUSCULAR | Status: AC
  Administered 2016-07-07: 10 mg via INTRAVENOUS
  Filled 2016-07-07: qty 2

## 2016-07-07 MED ORDER — DIPHENHYDRAMINE HCL 50 MG/ML IJ SOLN
INTRAMUSCULAR | Status: AC
Start: 2016-07-07 — End: 2016-07-08
  Filled 2016-07-07: qty 1

## 2016-07-07 MED ORDER — DEXAMETHASONE 4 MG PO TABS
8.0000 mg | ORAL_TABLET | Freq: Once | ORAL | Status: AC
  Administered 2016-07-07: 8 mg via ORAL
  Filled 2016-07-07: qty 2

## 2016-07-07 MED ORDER — SODIUM CHLORIDE 0.9 % IV BOLUS (SEPSIS)
1000.0000 mL | Freq: Once | INTRAVENOUS | Status: AC
  Administered 2016-07-07: 1000 mL via INTRAVENOUS

## 2016-07-07 MED ORDER — DIPHENHYDRAMINE HCL 50 MG/ML IJ SOLN
25.0000 mg | Freq: Once | INTRAMUSCULAR | Status: AC
  Administered 2016-07-07: 25 mg via INTRAVENOUS
  Filled 2016-07-07: qty 1

## 2016-07-07 NOTE — ED Notes (Signed)
Pt denying fevers or diarrhea. Pt has been taking Tylenol for her post-op pain. Pt has large healing incision on her right lateral head.

## 2016-07-07 NOTE — ED Provider Notes (Signed)
[facility] Emergency Department Provider Note  ____________________________________________   First MD Initiated Contact with Patient 07/07/16 1600     (approximate)  I have reviewed the triage vital signs and the nursing notes.   HISTORY  Chief Complaint Emesis and Post-op Problem   HPI Jessica [name] is a 61 y.o. female with a recent history of brain tumor resection at [facility] was presented to the emergency department with weakness and nausea and vomiting. She says that she was discharged this past Friday and has been taking Zofran at home but despite taking this anti-medic she is still vomiting. She has been able to drink some ginger ale and minimal amount of water. However, she says that since she has been home she is only had about 1 cracker of solid food. She says the pain is been reducing in her head and that she has only soreness over the incision but no headache at this time. Denies any focal weakness.   Past Medical History:  Diagnosis Date  . Asthma   . Elevated cholesterol   . History of chicken pox   . History of measles   . History of mumps     Patient Active Problem List   Diagnosis Date Noted  . Anxiety 02/21/2015  . Depression 02/21/2015  . 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 Vitamin D deficiency 10/15/2008  . Allergic rhinitis 03/19/2007  . Esophageal reflux 03/19/2007  . Family history of breast cancer 03/19/2007  . Hypercholesteremia 03/19/2007  . Insomnia 03/19/2007  . Menopausal symptom 03/19/2007    Past Surgical History:  Procedure Laterality Date  . CT Scan of head  2009   negative  . FOOT SURGERY    . LAPAROSCOPY     abdominal endometriosis  . TUBAL LIGATION  1984    Prior to Admission medications   Medication Sig Start Date End Date Taking? Authorizing Provider  acetaminophen (TYLENOL) 325 MG tablet Take 975 mg by mouth every 6 (six) hours as needed.   Yes [provider]  atorvastatin  (LIPITOR) 40 MG tablet TAKE 1 TABLET DAILY 03/28/15  Yes Birdie Sons, MD  bacitracin 500 UNIT/GM ointment Apply 1 application topically daily.   Yes [provider]  dexamethasone (DECADRON) 4 MG tablet Take 4 mg by mouth See admin instructions. tk 4mg  qid x 5 days, then 4mg  tid x 1 days, then 4mg  bid x 1 days, 2mg  bid x 1 day, then 2mg  once a day for 1 day, then stop.   Yes [provider]  fluticasone (FLONASE) 50 MCG/ACT nasal spray SHAKE LIQUID AND USE 2 SPRAYS IN EACH NOSTRIL DAILY 01/31/15  Yes Birdie Sons, MD  levETIRAcetam (KEPPRA) 1000 MG tablet Take 1,000 mg by mouth See admin instructions. tk 1000mg  bid x 10 days, if no seizures then decrease to 500mg  bid for 10 days, then if no seizures may stop   Yes [provider]  mometasone (NASONEX) 50 MCG/ACT nasal spray Place 2 sprays into the nose daily.   Yes [provider]  montelukast (SINGULAIR) 10 MG tablet Take 10 mg by mouth at bedtime.   Yes [provider]  omeprazole (PRILOSEC) 20 MG capsule Take 1 capsule (20 mg total) by mouth daily. 07/08/14  Yes Birdie Sons, MD  ondansetron (ZOFRAN-ODT) 8 MG disintegrating tablet Take 8 mg by mouth every 8 (eight) hours as needed for nausea or vomiting.   Yes [provider]  oxyCODONE (OXY IR/ROXICODONE) 5 MG immediate  release tablet Take 5 mg by mouth every 6 (six) hours as needed.   Yes [provider]  polyethylene glycol (MIRALAX / GLYCOLAX) packet Take 17 g by mouth daily as needed.   Yes [provider]  sennosides-docusate sodium (SENOKOT-S) 8.6-50 MG tablet Take 2 tablets by mouth 2 (two) times daily.   Yes [provider]  solifenacin (VESICARE) 10 MG tablet Take by mouth daily.   Yes [provider]  traZODone (DESYREL) 50 MG tablet Take 50-100 mg by mouth at bedtime.   Yes [provider]  buPROPion (WELLBUTRIN XL) 300 MG 24 hr tablet Take 1 tablet (300 mg total) by mouth  daily. Patient not taking: Reported on 07/07/2016 07/08/14   Birdie Sons, MD  busPIRone (BUSPAR) 5 MG tablet Take 1 tablet (5 mg total) by mouth 3 (three) times daily. Patient not taking: Reported on 07/07/2016 03/01/15   Birdie Sons, MD  HYDROcodone-homatropine [facility]) 5-1.5 MG/5ML syrup 5 ml 4-6 hours as needed for cough Patient not taking: Reported on 07/07/2016 02/22/15   Carmon Ginsberg, PA  zolpidem (AMBIEN) 5 MG tablet TAKE 1 TABLET BY MOUTH EVERY NIGHT AT BEDTIME AS NEEDED FOR INSOMNIA Patient not taking: Reported on 07/07/2016 05/18/15   Birdie Sons, MD    Allergies Augmentin [amoxicillin-pot clavulanate] and Compazine  [prochlorperazine]  Family History  Problem Relation Age of Onset  . Breast cancer Mother 92  . Depression Mother   . Hyperlipidemia Mother   . Hypothyroidism Mother   . Hypertension Mother   . Diabetes Father   . Hypertension Father   . Hypertension Brother   . Hypertension Sister     Social History Social History  Substance Use Topics  . Smoking status: Never Smoker  . Smokeless tobacco: Never Used  . Alcohol use 0.0 oz/week     Comment: occasional use    Review of Systems  Constitutional: No fever/chills Eyes: No visual changes. ENT: No sore throat. Cardiovascular: Denies chest pain. Respiratory: Denies shortness of breath. Gastrointestinal: No abdominal pain.   No diarrhea.  No constipation. Genitourinary: Negative for dysuria. Musculoskeletal: Negative for back pain. Skin: Negative for rash. Neurological: Negative for headaches, focal weakness or numbness.   ____________________________________________   PHYSICAL EXAM:  VITAL SIGNS: ED Triage Vitals  Enc Vitals Group     BP 07/07/16 1510 128/69     Pulse Rate 07/07/16 1510 88     Resp 07/07/16 1510 18     Temp 07/07/16 1510 98.7 F (37.1 C)     Temp Source 07/07/16 1510 Oral     SpO2 07/07/16 1510 97 %     Weight 07/07/16 1511 180 lb (81.6 kg)     Height 07/07/16 1511  5\' 5"  (1.651 m)     Head Circumference --      Peak Flow --      Pain Score 07/07/16 1513 6     Pain Loc --      Pain Edu? --      Excl. in [place]? --     Constitutional: Alert and oriented. Well appearing and in no acute distress. Eyes: Conjunctivae are normal.  Head:  Incision to the right side of her scalp is clean dry and intact. No surrounding erythema or tenderness palpation. No pus. Nose: No congestion/rhinnorhea. Mouth/Throat: Mucous membranes are moist.  Neck: No stridor.   Cardiovascular: Normal rate, regular rhythm. Grossly normal heart sounds.   Respiratory: Normal respiratory effort.  No retractions. Lungs CTAB. Gastrointestinal:  Soft and nontender. No distention.  Musculoskeletal: No lower extremity tenderness nor edema.  No joint effusions. Neurologic:  Normal speech and language. No gross focal neurologic deficits are appreciated. Skin:  Skin is warm, dry and intact. No rash noted. Psychiatric: Mood and affect are normal. Speech and behavior are normal.  ____________________________________________   LABS (all labs ordered are listed, but only abnormal results are displayed)  Labs Reviewed  COMPREHENSIVE METABOLIC PANEL - Abnormal; Notable for the following:       Result Value   Sodium 133 (*)    Chloride 96 (*)    Glucose, Bld 150 (*)    Total Bilirubin 2.1 (*)    All other components within normal limits  CBC - Abnormal; Notable for the following:    WBC 15.4 (*)    RBC 5.31 (*)    All other components within normal limits  LIPASE, BLOOD  URINALYSIS, COMPLETE (UACMP) WITH MICROSCOPIC   ____________________________________________  EKG   ____________________________________________  RADIOLOGY  Small amount of blood on the CT scan which appears to be a normal postoperative finding. No increased ventricular size. ____________________________________________   PROCEDURES  Procedure(s) performed:   Procedures  Critical Care performed:    ____________________________________________   INITIAL IMPRESSION / ASSESSMENT AND PLAN / ED COURSE  Pertinent labs & imaging results that were available during my care of the patient were reviewed by me and considered in my medical decision making (see chart for details).    Clinical Course as of Jul 07 2212  Tue Jul 07, 2016  1714 Patient with improvement in her nausea and has not vomited since the Reglan. However, she is feeling very anxious. She was given 25 mg of Benadryl which helped temporarily. We will add an additional 25 mg. Her husband says that the steroids also help to calm her down. I discussed the case with the Maia Plan, the patient's neurosurgeon, Dr. Tommi Rumps, recommends increasing the Decadron 8 mg every 6 hours and obtaining a CAT scan to make sure there is no new swelling. Recommendation from the neurosurgeon was that the steroids begin to deteriorate milligrams every 6 hours for the next 5 days and that they would schedule follow-up appointment so that the patient may follow-up urgently for reevaluation.  [DS]    Clinical Course User Index [DS] Orbie Pyo, MD    ----------------------------------------- 10:14 PM on 07/07/2016 -----------------------------------------  Patient now able to tolerate by mouth fluids and solids. Did have what appeared to be a mild dystonic reaction which was treated with Benadryl. Will be discharged home with the increased steroid dose. She'll be following up with her neurosurgeon's office said they will be contacting the patient for follow-up appointment. Patient and family aware of the increased dose of the Decadron. I discussed the CAT scan with Dr. Tommi Rumps and he agrees that this is a normal postoperative finding and that the patient may be discharged home. ____________________________________________   FINAL CLINICAL IMPRESSION(S) / ED DIAGNOSES  Nausea and vomiting.    NEW MEDICATIONS STARTED DURING  THIS VISIT:  New Prescriptions   No medications on file     Note:  This document was prepared using Dragon voice recognition software and may include unintentional dictation errors.     Orbie Pyo, MD 07/07/16 2215

## 2016-07-07 NOTE — ED Triage Notes (Signed)
Patient presents to the ED with nausea and vomiting since Friday and vomiting approx. 12 times today.  Patient had surgery to remove a brain tumor on Thursday.  Patient appears very tired and uncomfortable.  Patient's husband states, "she really hasn't been able to eat much of anything since she's been home."

## 2016-07-07 NOTE — ED Notes (Signed)
Pt is currently tolerating PO. Pt was able to drink ginger ale and keep it down.

## 2016-07-07 NOTE — ED Notes (Signed)
Pt stating that she is feeling "a little" better. Pt is resting with eyes closed at this time. Pt instructed to try to hold her arm a little straighter so she could receive the rest of her PIV fluids. Pt in NAD at this time and family remains at the bedside.

## 2016-07-07 NOTE — ED Notes (Signed)
FN: pt post brain surgery, not eating or drinking, n/v.

## 2016-07-07 NOTE — ED Notes (Signed)
Pt keeps stating that she "feels panicky and jittery since I took that nausea medication." Dr. Clearnce Hasten is aware and pt has now received a 2nd dose of Benadryl. Pt is stating that he nausea is better at this time.

## 2016-07-07 NOTE — ED Notes (Signed)
Pt stating that she had surgery on the 31st of May and has been experiencing nausea since then. Pt stayed in the hospital overnight. Pt has bee experiencing n/v since then. Pt stating emesis 3 times a day. Pt stating today she is feeling worse. Pt stating more nausea and increased weakness today. Pt has had decreased appetite. Pt has been able to take sips of fluids.

## 2016-07-14 ENCOUNTER — Ambulatory Visit
Admission: RE | Admit: 2016-07-14 | Discharge: 2016-07-14 | Disposition: A | Discharge status: Home / Self Care | Payer: BLUE CROSS/BLUE SHIELD | Source: Ambulatory Visit | Attending: Radiation Oncology | Admitting: Radiation Oncology

## 2016-07-14 ENCOUNTER — Encounter: Payer: Self-pay | Admitting: *Deleted

## 2016-07-14 ENCOUNTER — Other Ambulatory Visit: Payer: Self-pay | Admitting: Hematology and Oncology

## 2016-07-14 ENCOUNTER — Encounter: Payer: Self-pay | Admitting: Radiation Oncology

## 2016-07-14 VITALS — BP 127/77 | HR 72 | Temp 97.4°F | Resp 20 | Wt 176.0 lb

## 2016-07-14 DIAGNOSIS — C719 Malignant neoplasm of brain, unspecified: Secondary | ICD-10-CM | POA: Insufficient documentation

## 2016-07-14 DIAGNOSIS — E7800 Pure hypercholesterolemia, unspecified: Secondary | ICD-10-CM | POA: Diagnosis not present

## 2016-07-14 DIAGNOSIS — Z803 Family history of malignant neoplasm of breast: Secondary | ICD-10-CM | POA: Diagnosis not present

## 2016-07-14 DIAGNOSIS — J45909 Unspecified asthma, uncomplicated: Secondary | ICD-10-CM | POA: Insufficient documentation

## 2016-07-14 DIAGNOSIS — Z79899 Other long term (current) drug therapy: Secondary | ICD-10-CM | POA: Insufficient documentation

## 2016-07-14 DIAGNOSIS — Z510 Encounter for antineoplastic radiation therapy: Secondary | ICD-10-CM | POA: Diagnosis not present

## 2016-07-14 HISTORY — DX: Malignant neoplasm of brain, unspecified: C71.9

## 2016-07-14 MED ORDER — TEMOZOLOMIDE 140 MG PO CAPS: 140.0000 mg | ORAL_CAPSULE | Freq: Every day | ORAL | 0 refills | Status: DC

## 2016-07-14 MED ORDER — TEMOZOLOMIDE 5 MG PO CAPS: 5.0000 mg | ORAL_CAPSULE | Freq: Every day | ORAL | 0 refills | Status: DC

## 2016-07-14 NOTE — Consult Note (Signed)
NEW PATIENT EVALUATION  Name: Jessica [name]  MRN: [id number]  Date:   07/14/2016     DOB: 1955-08-31   This 61 y.o. female patient presents to the clinic for initial evaluation of GBM status post partial resection.  REFERRING PHYSICIAN: Birdie Sons, MD  CHIEF COMPLAINT:  Chief Complaint  Patient presents with  . 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   PREVIOUS INVESTIGATIONS:  MRI scans from [facility] requested for my review Pathology report reviewed Clinical notes reviewed  HPI: Patient is a 61 year old female who presented with fairly rapid onset of confusion and inability to do simple math. She also noticed some decreased left peripheral vision. She progressed developing migraine headaches and was seen at the emergency room and [facility] where brain imaging showed a 5 x 6.7 x 5.5 cm right parieto-occipital mass consistent with primary brain malignancy. She was transferred to [facility] where biopsy showed grade 3 anaplastic astrocytoma. Dr. Tommi Rumps at [facility] took her to surgery on May 31 and removed a GBM. Grade 4. Postop MRI scan showed substantial resection of the right cerebral hemisphere mass with residual disease seen in enhancement in the right frontal perivascular white matter and within this splenium of the corpus callosum. She has done fairly well postoperatively. 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 She's been recommended for Temodar with concurrent radiation and is now seen by [facility] for evaluation. She also has appointment with medical oncology later this week.  PLANNED TREATMENT REGIMEN: Temodar with external beam partial brain radiation  PAST MEDICAL HISTORY:  has a past medical history of Asthma; Elevated cholesterol; Glioblastoma ([place]); History of chicken pox; History of measles; and History of mumps.    PAST SURGICAL HISTORY:  Past Surgical History:   Procedure Laterality Date  . CT Scan of head  2009   negative  . FOOT SURGERY    . LAPAROSCOPY     abdominal endometriosis  . TUBAL LIGATION  1984    FAMILY HISTORY: family history includes Breast cancer (age of onset: 74) in her mother; Depression in her mother; Diabetes in her father; Hyperlipidemia in her mother; Hypertension in her brother, father, mother, and sister; Hypothyroidism in her mother.  SOCIAL HISTORY:  reports that she has never smoked. She has never used smokeless tobacco. She reports that she drinks alcohol. She reports that she does not use drugs.  ALLERGIES: Augmentin [amoxicillin-pot clavulanate]; Compazine  [prochlorperazine]; and Oxycodone  MEDICATIONS:  Current Outpatient Prescriptions  Medication Sig Dispense Refill  . acetaminophen (TYLENOL) 325 MG tablet Take 975 mg by mouth every 6 (six) hours as needed.    Marland Kitchen atorvastatin (LIPITOR) 40 MG tablet TAKE 1 TABLET DAILY 90 tablet 4  . bacitracin 500 UNIT/GM ointment Apply 1 application topically daily.    Marland Kitchen buPROPion (WELLBUTRIN XL) 300 MG 24 hr tablet Take 1 tablet (300 mg total) by mouth daily. 90 tablet 3  . dexamethasone (DECADRON) 4 MG tablet DECADRON LOW TAPER: Decadron 4mg  four times a day x 5 days; then 4mg  three times a day x 1 day; then 4mg  twice a day x 1 day; then 2mg  twice a day x 1 day; then 2mg  once a day x 1 day and STOP    . levETIRAcetam (KEPPRA) 1000 MG tablet Take 1,000 mg by mouth See admin instructions. tk 1000mg  bid x 10 days, if no seizures then decrease to  500mg  bid for 10 days, then if no seizures may stop    . mometasone (NASONEX) 50 MCG/ACT nasal spray Place 2 sprays into the nose daily.    . montelukast (SINGULAIR) 10 MG tablet Take 10 mg by mouth at bedtime.    Marland Kitchen omeprazole (PRILOSEC) 20 MG capsule Take 1 capsule (20 mg total) by mouth daily. 90 capsule 3  . ondansetron (ZOFRAN-ODT) 8 MG disintegrating tablet Take 8 mg by mouth every 8 (eight) hours as needed for nausea or vomiting.     Marland Kitchen oxyCODONE (OXY IR/ROXICODONE) 5 MG immediate release tablet Take 5 mg by mouth every 6 (six) hours as needed.    . polyethylene glycol (MIRALAX / GLYCOLAX) packet Take 17 g by mouth daily as needed.    . sennosides-docusate sodium (SENOKOT-S) 8.6-50 MG tablet Take 2 tablets by mouth 2 (two) times daily.    . sertraline (ZOLOFT) 25 MG tablet Take by mouth.    . solifenacin (VESICARE) 10 MG tablet Take by mouth daily.    . traZODone (DESYREL) 50 MG tablet Take 50-100 mg by mouth at bedtime.    . busPIRone (BUSPAR) 5 MG tablet Take 1 tablet (5 mg total) by mouth 3 (three) times daily. (Patient not taking: Reported on 07/07/2016) 270 tablet 3  . fluticasone (FLONASE) 50 MCG/ACT nasal spray SHAKE LIQUID AND USE 2 SPRAYS IN EACH NOSTRIL DAILY 16 g 5  . HYDROcodone-homatropine (HYCODAN) 5-1.5 MG/5ML syrup 5 ml 4-6 hours as needed for cough (Patient not taking: Reported on 07/07/2016) 240 mL 0  . nystatin (MYCOSTATIN) 100000 UNIT/ML suspension Swish and swallow 5 mLs 4 (four) times daily for 10 days.    Marland Kitchen temozolomide (TEMODAR) 140 MG capsule Take 1 capsule (140 mg total) by mouth daily. May take on an empty stomach or at bedtime to decrease nausea & vomiting. (Patient not taking: Reported on 07/14/2016) 42 capsule 0  . temozolomide (TEMODAR) 5 MG capsule Take 1 capsule (5 mg total) by mouth daily. May take on an empty stomach or at bedtime to decrease nausea & vomiting. (Patient not taking: Reported on 07/14/2016) 42 capsule 0  . zolpidem (AMBIEN) 5 MG tablet TAKE 1 TABLET BY MOUTH EVERY NIGHT AT BEDTIME AS NEEDED FOR INSOMNIA (Patient not taking: Reported on 07/07/2016) 30 tablet 5   No current facility-administered medications for this encounter.     ECOG PERFORMANCE STATUS:  1 - Symptomatic but completely ambulatory  REVIEW OF SYSTEMS: Neurologic signs of visual field deficit apraxia and still some mild confusion otherwise Patient denies any weight loss, fatigue, weakness, fever, chills or night sweats.  Patient denies any loss of vision, blurred vision. Patient denies any ringing  of the ears or hearing loss. No irregular heartbeat. Patient denies heart murmur or history of fainting. Patient denies any chest pain or pain radiating to her upper extremities. Patient denies any shortness of breath, difficulty breathing at night, cough or hemoptysis. Patient denies any swelling in the lower legs. Patient denies any nausea vomiting, vomiting of blood, or coffee ground material in the vomitus. Patient denies any stomach pain. Patient states has had normal bowel movements no significant constipation or diarrhea. Patient denies any dysuria, hematuria or significant nocturia. Patient denies any problems walking, swelling in the joints or loss of balance. Patient denies any skin changes, loss of hair or loss of weight. Patient denies any excessive worrying or anxiety or significant depression. Patient denies any problems with insomnia. Patient denies excessive thirst, polyuria, polydipsia. Patient denies any swollen  glands, patient denies easy bruising or easy bleeding. Patient denies any recent infections, allergies or URI. Patient "s visual fields have not changed significantly in recent time.    PHYSICAL EXAM: BP 127/77   Pulse 72   Temp 97.4 F (36.3 C)   Resp 20   Wt 176 lb (79.8 kg)   BMI 29.29 kg/m  Cranial nerves II through XII are grossly intact motor sensory and DTR levels are equal and symmetric in the upper and lower extremities. She has some subtle left visual field deficits. Proprioception is intact. Craniotomy scar is healing well. Well-developed well-nourished patient in NAD. HEENT reveals PERLA, EOMI, discs not visualized.  Oral cavity is clear. No oral mucosal lesions are identified. Neck is clear without evidence of cervical or supraclavicular adenopathy. Lungs are clear to A&P. Cardiac examination is essentially unremarkable with regular rate and rhythm without murmur rub or thrill. Abdomen  is benign with no organomegaly or masses noted. Motor sensory and DTR levels are equal and symmetric in the upper and lower extremities. Cranial nerves II through XII are grossly intact. Proprioception is intact. No peripheral adenopathy or edema is identified. No motor or sensory levels are noted. Crude visual fields are within normal range.  LABORATORY DATA: Pathology reports reviewed    RADIOLOGY RESULTS: MRI scan reports reviewed I have asked for those images to be sent to [place] for my personal review and for treatment planning purposes   IMPRESSION: Grade 4 GBM status post partial resection in 61 year old female  PLAN: At this time I like to go ahead with concurrent chemotherapy with oral Temodar and external beam radiation therapy. Would plan on delivering 6000 cGy over 6 weeks using I MRT treatment planning and delivery. I will use her MRI scans for treatment planning purposes. Risks and benefits of treatment including hair loss, fatigue, headaches, possible cognitive decline, skin reaction, alteration of blood counts all were described in detail.There will be extra effort by both professional staff as well as technical staff to coordinate and manage concurrent chemoradiation and ensuing side effects during her treatments. I first set up and ordered CT simulation the week of June 25 to allow some more healing of her craniotomy scar. She will see medical oncologists week and make arrangements for her oral Temodar treatment. Patient family seem to comprehend my treatment plan well.  I would like to take this opportunity to thank you for allowing me to participate in the care of your patient.Armstead Peaks., MD

## 2016-07-14 NOTE — Progress Notes (Signed)
START OFF PATHWAY REGIMEN - [Other Dx]   [id number]:Temozolomide 75 mg/m2 PO D1-42 + RT:   One cycle, daily for 42 days concurrent with RT:     Temozolomide   **Always confirm dose/schedule in your pharmacy ordering system**    Patient Characteristics: Intent of Therapy: Non-Curative / Palliative Intent, Discussed with Patient

## 2016-07-14 NOTE — Progress Notes (Signed)
Per Dr. Mike Gip - sent rx to [facility] for new start temodar. Pt will see Dr. Mike Gip as new pt on Friday and further consent pt for oral chemo.

## 2016-07-15 ENCOUNTER — Telehealth: Payer: Self-pay | Admitting: *Deleted

## 2016-07-15 ENCOUNTER — Other Ambulatory Visit: Payer: Self-pay | Admitting: Radiation Oncology

## 2016-07-15 ENCOUNTER — Ambulatory Visit
Admission: RE | Admit: 2016-07-15 | Discharge: 2016-07-15 | Disposition: A | Discharge status: Home / Self Care | Payer: Self-pay | Source: Ambulatory Visit | Attending: Radiation Oncology | Admitting: Radiation Oncology

## 2016-07-15 DIAGNOSIS — R52 Pain, unspecified: Secondary | ICD-10-CM

## 2016-07-15 NOTE — Telephone Encounter (Signed)
  I was wondering if we should make any changes in her pill sizes in case  she needs a dose reduction in the 6 weeks she is on therapy.  M

## 2016-07-15 NOTE — Telephone Encounter (Signed)
Shannon from [facility] contacted cancer center at [phone].  Per [facility], patient's insurance requires [facility] to dispense her Temodar. I sent the prescription yesterday to [facility] and provided Rodena Piety, RN the faxed info/copy of the original rx. [facility] will forward the prescription to [facility].

## 2016-07-15 NOTE — Telephone Encounter (Signed)
Thanks will make a note of it.

## 2016-07-16 ENCOUNTER — Encounter: Payer: Self-pay | Admitting: Anatomic Pathology & Clinical Pathology

## 2016-07-17 ENCOUNTER — Inpatient Hospital Stay: Payer: BLUE CROSS/BLUE SHIELD | Attending: Hematology and Oncology | Admitting: Hematology and Oncology

## 2016-07-17 ENCOUNTER — Telehealth: Payer: Self-pay | Admitting: *Deleted

## 2016-07-17 VITALS — BP 121/84 | HR 80 | Temp 96.8°F | Resp 18 | Wt 160.0 lb

## 2016-07-17 DIAGNOSIS — J45909 Unspecified asthma, uncomplicated: Secondary | ICD-10-CM | POA: Insufficient documentation

## 2016-07-17 DIAGNOSIS — R630 Anorexia: Secondary | ICD-10-CM | POA: Diagnosis not present

## 2016-07-17 DIAGNOSIS — C718 Malignant neoplasm of overlapping sites of brain: Secondary | ICD-10-CM | POA: Insufficient documentation

## 2016-07-17 DIAGNOSIS — R51 Headache: Secondary | ICD-10-CM

## 2016-07-17 DIAGNOSIS — H53462 Homonymous bilateral field defects, left side: Secondary | ICD-10-CM

## 2016-07-17 DIAGNOSIS — L8990 Pressure ulcer of unspecified site, unspecified stage: Secondary | ICD-10-CM | POA: Insufficient documentation

## 2016-07-17 DIAGNOSIS — R110 Nausea: Secondary | ICD-10-CM | POA: Diagnosis not present

## 2016-07-17 DIAGNOSIS — Z803 Family history of malignant neoplasm of breast: Secondary | ICD-10-CM | POA: Diagnosis not present

## 2016-07-17 DIAGNOSIS — C719 Malignant neoplasm of brain, unspecified: Secondary | ICD-10-CM

## 2016-07-17 DIAGNOSIS — E876 Hypokalemia: Secondary | ICD-10-CM | POA: Insufficient documentation

## 2016-07-17 DIAGNOSIS — R5383 Other fatigue: Secondary | ICD-10-CM | POA: Insufficient documentation

## 2016-07-17 DIAGNOSIS — Z79899 Other long term (current) drug therapy: Secondary | ICD-10-CM

## 2016-07-17 DIAGNOSIS — E7800 Pure hypercholesterolemia, unspecified: Secondary | ICD-10-CM | POA: Diagnosis not present

## 2016-07-17 NOTE — Progress Notes (Signed)
[address] Clinic day:  07/17/2016  Chief Complaint: Jessica [name] is a 61 y.o. female with a glioblastoma who is referred by Dr. Gaston Islam for new patient assessment and management.  HPI:  The patient was in her usual state of good health until 05/2016 when she experienced symptoms of confusion and difficulty with simple math at work up. She is a Scientist, water quality. She was unable to find her car in the parking lot at [employer]. She drove into a parked car due to poor left peripheral vision.  She presented to [facility] on 06/18/2016 with acute migraine headaches with associated nausea and vomiting.  Head CT without contrast revealed a 4.9 x 6.7 x 5.5 cm right parietal occipital mass consistent with primary brain neoplasm. There was a 7 mm right to left midline shift with a right temporal horn entrapment. She was transferred to [facility]. 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  She was referred to Dr. Derrill Memo at [facility].  Head MRI at [facility] on 07/01/2016 revealed a 5.7 x 4.5 cm heterogeneously enhancing T2 hyperintense lesion in the right is posterior medial temporal lobe with extension into the adjacent right parietal and occipital lobes.  There was a separate enhancing component along the ependymal surface at the body of the right lateral ventricle extending into the centrum semiovale. There was marked mass effect on the atrium and occipital horn of the right lateral ventricle with slight increase in dilation of the temporal horn of the right lateral ventricle, consistent with at least partial entrapment. There was stable dilation of the left lateral ventricle.  There was no hemorrhage or large acute cortical infarction.   She underwent BrainLab-guided right temporal occipital craniotomy and resection of the mass by Dr. Derrill Memo and Dr. Deetta Perla on 07/02/2016.  Pathology revealed glioblastoma, WHO grade IV.  She  tolerated surgery well. She was discharged home on Keppra and a Decadron taper. She denied any history of seizures.  Head MRI at [facility] on 07/02/2016 revealed substantial resection of the mass in the right cerebral hemisphere with residual disease disease. There was T2/FLAIR signal hyperintensity along the margins of the resection cavity. There was focal masslike T2/FLAIR signal hyperintensity and enhancement in the right frontal periventricular white matter, and within the splenium of the corpus callosum compatible with residual disease. There was enhancement along the margins of the resection cavity, which appeared nodular at the posterolateral aspect.  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  She presented to [facility] ER on 07/07/2016 with nausea and vomiting associated with her Decadron taper. At that time she was on Decadron 4 mg TID x 1 day. She states she was also dehydrated. Steroid dosing was increased to 8 mg TID.  She was started on a new Decadron taper.  The patient was seen at the [facility] on 07/09/2016. At that time she was fatigued. She had persistent left homonymous hemianopsia. She was sleeping 16 hours a day. She was taking Tylenol 3 times a day for headache.  She denied any neurologic symptoms.  Recommendation was for concurrent Temodar and radiation.  The patient was seen by Dr.Chrystal on 07/14/2016.  Plan is for 6000 cGy over 6 weeks using I MRT treatment planning and delivery. CT simulation is scheduled for the week of 07/27/2016 to allow additional time for healing of her craniotomy scar.  Symptomatically, she notes achiness in her legs. She has  a slight headache. She takes Tylenol.  Pain medications cause nausea. She has trouble with her vision on the left side. She notes good balance and coordination. She spends the day going from her bedroom to the recliner and to the bathroom. She needs help with her shower. She has trouble getting off  of the commode.    Past Medical History:  Diagnosis Date  . Asthma   . Elevated cholesterol   . Glioblastoma ([facility])   . History of chicken pox   . History of measles   . History of mumps     Past Surgical History:  Procedure Laterality Date  . CT Scan of head  2009   negative  . FOOT SURGERY    . LAPAROSCOPY     abdominal endometriosis  . TUBAL LIGATION  1984    Family History  Problem Relation Age of Onset  . Breast cancer Mother 83  . Depression Mother   . Hyperlipidemia Mother   . Hypothyroidism Mother   . Hypertension Mother   . Diabetes Father   . Hypertension Father   . Hypertension Brother   . Hypertension Sister     Social History:  reports that she has never smoked. She has never used smokeless tobacco. She reports that she drinks alcohol. She reports that she does not use drugs.  She has 2 children (age 45 and 92).  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  She lives in [place].  The patient is accompanied by her husband, Yvone Neu, today.  Allergies:  Allergies  Allergen Reactions  . Augmentin [Amoxicillin-Pot Clavulanate] Nausea And Vomiting  . Compazine  [Prochlorperazine]     swelling, SOB.  Marland Kitchen Oxycodone Nausea Only    Current Medications: Current Outpatient Prescriptions  Medication Sig Dispense Refill  . acetaminophen (TYLENOL) 325 MG tablet Take 975 mg by mouth every 6 (six) hours as needed.    Marland Kitchen atorvastatin (LIPITOR) 40 MG tablet TAKE 1 TABLET DAILY 90 tablet 4  . bacitracin 500 UNIT/GM ointment Apply 1 application topically daily.    Marland Kitchen buPROPion (WELLBUTRIN XL) 300 MG 24 hr tablet Take 1 tablet (300 mg total) by mouth daily. 90 tablet 3  . dexamethasone (DECADRON) 4 MG tablet DECADRON LOW TAPER: Decadron 68m four times a day x 5 days; then 468mthree times a day x 1 day; then 31m78mwice a day x 1 day; then 2mg16mice a day x 1 day; then 2mg 65me a day x 1 day and STOP    . fluticasone (FLONASE) 50 MCG/ACT nasal spray SHAKE LIQUID AND  USE 2 SPRAYS IN EACH NOSTRIL DAILY 16 g 5  . levETIRAcetam (KEPPRA) 1000 MG tablet Take 1,000 mg by mouth See admin instructions. tk 1000mg 62mx 10 days, if no seizures then decrease to 500mg b13mor 10 days, then if no seizures may stop    . mometasone (NASONEX) 50 MCG/ACT nasal spray Place 2 sprays into the nose daily.    . montelukast (SINGULAIR) 10 MG tablet Take 10 mg by mouth at bedtime.    . nystaMarland Kitchenin (MYCOSTATIN) 100000 UNIT/ML suspension Swish and swallow 5 mLs 4 (four) times daily for 10 days.    . omeprMarland Kitchenzole (PRILOSEC) 20 MG capsule Take 1 capsule (20 mg total) by mouth daily. 90 capsule 3  . ondansetron (ZOFRAN-ODT) 8 MG disintegrating tablet Take 8 mg by mouth every 8 (eight) hours as needed for nausea or vomiting.    . oxyCOMarland KitchenONE (OXY IR/ROXICODONE)  5 MG immediate release tablet Take 5 mg by mouth every 6 (six) hours as needed.    . polyethylene glycol (MIRALAX / GLYCOLAX) packet Take 17 g by mouth daily as needed.    . sennosides-docusate sodium (SENOKOT-S) 8.6-50 MG tablet Take 2 tablets by mouth 2 (two) times daily.    . sertraline (ZOLOFT) 25 MG tablet Take by mouth.    . solifenacin (VESICARE) 10 MG tablet Take by mouth daily.    . traZODone (DESYREL) 50 MG tablet Take 50-100 mg by mouth at bedtime.    . busPIRone (BUSPAR) 5 MG tablet Take 1 tablet (5 mg total) by mouth 3 (three) times daily. (Patient not taking: Reported on 07/07/2016) 270 tablet 3  . HYDROcodone-homatropine (HYCODAN) 5-1.5 MG/5ML syrup 5 ml 4-6 hours as needed for cough (Patient not taking: Reported on 07/07/2016) 240 mL 0  . temozolomide (TEMODAR) 140 MG capsule Take 1 capsule (140 mg total) by mouth daily. May take on an empty stomach or at bedtime to decrease nausea & vomiting. (Patient not taking: Reported on 07/14/2016) 42 capsule 0  . temozolomide (TEMODAR) 5 MG capsule Take 1 capsule (5 mg total) by mouth daily. May take on an empty stomach or at bedtime to decrease nausea & vomiting. (Patient not taking:  Reported on 07/14/2016) 42 capsule 0  . zolpidem (AMBIEN) 5 MG tablet TAKE 1 TABLET BY MOUTH EVERY NIGHT AT BEDTIME AS NEEDED FOR INSOMNIA (Patient not taking: Reported on 07/07/2016) 30 tablet 5   No current facility-administered medications for this visit.     Review of Systems:  GENERAL:  Fatigue.  No fevers or sweats.  Weight loss prior to diagnosis.  Previously weighed 170-180 pounds. PERFORMANCE STATUS (ECOG):  2-3 HEENT:  Left sided vision loss.  Thrush.  No runny nose, sore throat, mouth sores or tenderness. Lungs: No shortness of breath or cough.  No hemoptysis. Cardiac:  No chest pain, palpitations, orthopnea, or PND. GI:  No nausea, vomiting, diarrhea, constipation, melena or hematochezia.  Pain pills cause emesis. GU:  No urgency, frequency, dysuria, or hematuria. Musculoskeletal:  Legs achy.  No back pain.  No joint pain.  No muscle tenderness. Extremities:  Legs weak.  No pain or swelling. Skin:  No rashes or skin changes. Neuro:  Headache, mild.  Left homonymous hemianopsia.  No focal numbness, balance or coordination issues. Endocrine:  No diabetes, thyroid issues, hot flashes or night sweats. Psych:  No mood changes, depression or anxiety. Pain:  No focal pain. Review of systems:  All other systems reviewed and found to be negative.  Physical Exam: Blood pressure 121/84, pulse 80, temperature (!) 96.8 F (36 C), temperature source Tympanic, resp. rate 18, weight 160 lb (72.6 kg). GENERAL:  Well developed, well nourished, sitting comfortably in a wheelchair in the exam room in no acute distress. MENTAL STATUS:  Alert and oriented to person, place and time. HEAD:  Brown slightly disheveled hair.  Large incision with running suture.  Normocephalic, atraumatic, face symmetric, no Cushingoid features. EYES:  Glasses.  Blue eyes.  Pupils equal round and reactive to light and accomodation.  No conjunctivitis or scleral icterus. ENT:  Thrush.  Tongue normal. Mucous membranes  moist.  RESPIRATORY:  Clear to auscultation without rales, wheezes or rhonchi. CARDIOVASCULAR:  Regular rate and rhythm without murmur, rub or gallop. ABDOMEN:  Soft, non-tender, with active bowel sounds, and no hepatosplenomegaly.  No masses. SKIN:  No rashes, ulcers or lesions. EXTREMITIES: No edema, no skin discoloration or tenderness.  No palpable cords. LYMPH NODES: No palpable cervical, supraclavicular, axillary or inguinal adenopathy  NEUROLOGICAL: Alert & oriented.  Able to remember 3 of 3 words immediately and 1 of 3 words at 5 minutes with prompting.  Know current President only and last President with prompting.  Able to count backwards by 1 not 3s.  Cranial nerves II-XII intact except for vision (left homonymous hemianopsia).  Motor strength 5/5 throughout; sensation intact; finger to nose difficult to perform secondary to visual difficulties.  RAM normal.  No clonus or Babinski.  PSYCH:  Appropriate.   No visits with results within 3 Day(s) from this visit.  Latest known visit with results is:  Admission on 07/07/2016, Discharged on 07/07/2016  Component Date Value Ref Range Status  . Lipase 07/07/2016 35  11 - 51 U/L Final  . Sodium 07/07/2016 133* 135 - 145 mmol/L Final  . Potassium 07/07/2016 4.1  3.5 - 5.1 mmol/L Final  . Chloride 07/07/2016 96* 101 - 111 mmol/L Final  . CO2 07/07/2016 27  22 - 32 mmol/L Final  . Glucose, Bld 07/07/2016 150* 65 - 99 mg/dL Final  . BUN 07/07/2016 19  6 - 20 mg/dL Final  . Creatinine, Ser 07/07/2016 0.80  0.44 - 1.00 mg/dL Final  . Calcium 07/07/2016 9.0  8.9 - 10.3 mg/dL Final  . Total Protein 07/07/2016 7.6  6.5 - 8.1 g/dL Final  . Albumin 07/07/2016 4.1  3.5 - 5.0 g/dL Final  . AST 07/07/2016 17  15 - 41 U/L Final  . ALT 07/07/2016 19  14 - 54 U/L Final  . Alkaline Phosphatase 07/07/2016 81  38 - 126 U/L Final  . Total Bilirubin 07/07/2016 2.1* 0.3 - 1.2 mg/dL Final  . GFR calc non Af Amer 07/07/2016 >60  >60 mL/min Final  . GFR calc  Af Amer 07/07/2016 >60  >60 mL/min Final   Comment: (NOTE) The eGFR has been calculated using the CKD EPI equation. This calculation has not been validated in all clinical situations. eGFR's persistently <60 mL/min signify possible Chronic Kidney Disease.   . Anion gap 07/07/2016 10  5 - 15 Final  . WBC 07/07/2016 15.4* 3.6 - 11.0 K/uL Final  . RBC 07/07/2016 5.31* 3.80 - 5.20 MIL/uL Final  . Hemoglobin 07/07/2016 15.9  12.0 - 16.0 g/dL Final  . HCT 07/07/2016 45.2  35.0 - 47.0 % Final  . MCV 07/07/2016 85.1  80.0 - 100.0 fL Final  . MCH 07/07/2016 30.0  26.0 - 34.0 pg Final  . MCHC 07/07/2016 35.3  32.0 - 36.0 g/dL Final  . RDW 07/07/2016 13.1  11.5 - 14.5 % Final  . Platelets 07/07/2016 300  150 - 440 K/uL Final    Assessment:  MINOLA GUIN is a 61 y.o. 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    Head MRI at [facility] on 07/01/2016 revealed a 5.7 x 4.5 cm heterogeneously enhancing T2 hyperintense lesion in the right is posterior medial temporal lobe with extension into the adjacent right parietal and occipital lobes.  There was a separate enhancing component along the ependymal surface at the body of the right lateral ventricle extending into the centrum semiovale. There was marked mass effect on the atrium and occipital horn of the right lateral ventricle with slight increase in dilation of the temporal horn of the right lateral ventricle, consistent with at least partial entrapment. There was stable dilation of the left lateral ventricle.  There was no  hemorrhage or large acute cortical infarction.   Head MRI at [facility] on 07/02/2016 revealed substantial resection of the mass in the right cerebral hemisphere with residual disease disease. There was T2/FLAIR signal hyperintensity along the margins of the resection cavity. There was focal masslike T2/FLAIR signal hyperintensity and enhancement in the right frontal periventricular white  matter, and within the splenium of the corpus callosum compatible with residual disease. There was enhancement along the margins of the resection cavity, which appeared nodular at the posterolateral aspect.  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  Symptomatically, she is fatigued.  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  She needs assistance with ADLs.  Plan: 1.  Discuss diagnosis of glioblastoma.  Discuss plan for treatment with 6 weeks of concurrent chemotherapy (temozolomide) with radiation.  Patient will receive 42 consecutive days of Temodar 75 mg/m2.  Side effects reviewed.  Discussed PCP prophylaxis with Septra DS on MWF.  Patient has received Septra previously.  Temodar to be shipped to her house and not to be started until instructed by MD.  Treatment starts concurrently with radiation  Discussed plan for weekly visits while receiving concurrent chemotherapy and radiation.  Anticipate follow-up imaging at [place] 1 month s/p completion of radiation and chemotherapy.  Anticipate Temodar (150 mg/m2) 5 days every 28 days for 6 cycles post completion of concurrent chemotherapy and radiation.  2.  Continue planned Decadron taper:  07/16/16-07/18/16: Decadron 71m in the morning, 87mat lunch, 23m25mt dinner (64m23mily total) 07/19/16-07/21/16: Decadron 8mg 52mthe morning and 8mg a31m:00 in the afternoon (16mg d64m total) 07/24/16-07/26/16: Decadron 8mg in 56m morning and 23mg in t723mafternoon (12mg dail57mtal) 07/27/16-07/28/16: Decadron 23mg in the48mrning and 23mg at 3:006m the afternoon (8mg daily to41m) 07/29/16-07/31/16: Decadron 23mg in the mo63mng and 2mg at 3:00 in66me afternoon (6mg daily total423m6/30/18-07/02/18: Decadron 23mg in the morni70m(23mg daily total) 19m03/18-08/06/16: Decadron 3mg in the morning723mmg daily total) 0763m/18 until further instructed: Decadron 2mg in the morning  323m  Discuss management of  thrush.  Thrush is secondary tRitta Slotdron.  Patient to pick up swish and spit Nystatin or Magic Mouthwash (prescribed previously). 4.  Suture removal today. 5.  Rx: Temodar 75 mg/m2 po q day on day 1-42.  Day 1 is 1st day of radiation. 6.  Rx:  Septra DS 1 tablet every Monday, Wednesday, Friday. 6.  Coordinate care with Dr. Chrystal for concurreBaruch Goutytion and Temodar.  Anticipate start date 07/27/2016. 7.  Physical therapy consult. 8.  RTC weekly x 6 for MD assessment and labs (CBC with diff, CMP).   Melissa C Corcoran, MLequita Asal PM

## 2016-07-17 NOTE — Telephone Encounter (Signed)
Faxed Order received from surgeon to remove sutures, Dr. Mike Gip examined patient. Whip stitches removed by Honor Loh NP and Mallie Snooks RN, incision well approximated, no s/s of infection or bleeding noted, pt tolerated well.

## 2016-07-17 NOTE — Progress Notes (Signed)
Patient here today regarding glioblastoma.  Referred by Dr. Gaston Islam @ [facility].  Patient saw Dr. Donella Stade last week.

## 2016-07-18 ENCOUNTER — Encounter: Payer: Self-pay | Admitting: Hematology and Oncology

## 2016-07-20 ENCOUNTER — Other Ambulatory Visit: Payer: Self-pay | Admitting: *Deleted

## 2016-07-20 DIAGNOSIS — C719 Malignant neoplasm of brain, unspecified: Secondary | ICD-10-CM

## 2016-07-21 ENCOUNTER — Other Ambulatory Visit: Payer: Self-pay | Admitting: Hematology and Oncology

## 2016-07-21 MED ORDER — TEMOZOLOMIDE 100 MG PO CAPS: ORAL_CAPSULE | ORAL | 0 refills | Status: DC

## 2016-07-21 MED ORDER — TEMOZOLOMIDE 20 MG PO CAPS: 40.0000 mg | ORAL_CAPSULE | Freq: Every day | ORAL | 0 refills | Status: DC

## 2016-07-24 ENCOUNTER — Other Ambulatory Visit: Payer: Self-pay | Admitting: Hematology and Oncology

## 2016-07-24 ENCOUNTER — Telehealth: Payer: Self-pay | Admitting: *Deleted

## 2016-07-24 ENCOUNTER — Other Ambulatory Visit: Payer: Self-pay | Admitting: *Deleted

## 2016-07-24 DIAGNOSIS — C719 Malignant neoplasm of brain, unspecified: Secondary | ICD-10-CM

## 2016-07-24 MED ORDER — SULFAMETHOXAZOLE-TRIMETHOPRIM 800-160 MG PO TABS: 1.0000 | ORAL_TABLET | ORAL | 0 refills | Status: DC

## 2016-07-24 NOTE — Telephone Encounter (Signed)
Patient notified that the approval through insurance for the temador was approved and to NOT start the medication until she saw the MD again and was instructed to. Patient instructed to bring medication with her to the next appointment. Voiced understanding.

## 2016-07-27 ENCOUNTER — Encounter: Payer: Self-pay | Admitting: Oncology

## 2016-07-27 ENCOUNTER — Inpatient Hospital Stay (HOSPITAL_BASED_OUTPATIENT_CLINIC_OR_DEPARTMENT_OTHER): Payer: BLUE CROSS/BLUE SHIELD | Admitting: Oncology

## 2016-07-27 ENCOUNTER — Inpatient Hospital Stay: Payer: BLUE CROSS/BLUE SHIELD

## 2016-07-27 ENCOUNTER — Other Ambulatory Visit: Payer: Self-pay | Admitting: *Deleted

## 2016-07-27 VITALS — BP 127/84 | HR 94 | Temp 98.3°F | Resp 18

## 2016-07-27 DIAGNOSIS — C718 Malignant neoplasm of overlapping sites of brain: Secondary | ICD-10-CM | POA: Diagnosis not present

## 2016-07-27 DIAGNOSIS — R51 Headache: Secondary | ICD-10-CM | POA: Diagnosis not present

## 2016-07-27 DIAGNOSIS — R630 Anorexia: Secondary | ICD-10-CM | POA: Diagnosis not present

## 2016-07-27 DIAGNOSIS — E876 Hypokalemia: Secondary | ICD-10-CM

## 2016-07-27 DIAGNOSIS — E7800 Pure hypercholesterolemia, unspecified: Secondary | ICD-10-CM | POA: Diagnosis not present

## 2016-07-27 DIAGNOSIS — C719 Malignant neoplasm of brain, unspecified: Secondary | ICD-10-CM

## 2016-07-27 DIAGNOSIS — J45909 Unspecified asthma, uncomplicated: Secondary | ICD-10-CM | POA: Diagnosis not present

## 2016-07-27 DIAGNOSIS — R5383 Other fatigue: Secondary | ICD-10-CM

## 2016-07-27 DIAGNOSIS — R110 Nausea: Secondary | ICD-10-CM

## 2016-07-27 DIAGNOSIS — H53462 Homonymous bilateral field defects, left side: Secondary | ICD-10-CM

## 2016-07-27 DIAGNOSIS — Z803 Family history of malignant neoplasm of breast: Secondary | ICD-10-CM | POA: Diagnosis not present

## 2016-07-27 DIAGNOSIS — L8990 Pressure ulcer of unspecified site, unspecified stage: Secondary | ICD-10-CM | POA: Diagnosis not present

## 2016-07-27 DIAGNOSIS — Z79899 Other long term (current) drug therapy: Secondary | ICD-10-CM

## 2016-07-27 LAB — COMPREHENSIVE METABOLIC PANEL
ALT: 29 U/L (ref 14–54)
AST: 17 U/L (ref 15–41)
Albumin: 3.6 g/dL (ref 3.5–5.0)
Alkaline Phosphatase: 78 U/L (ref 38–126)
Anion gap: 11 (ref 5–15)
BUN: 18 mg/dL (ref 6–20)
CO2: 27 mmol/L (ref 22–32)
Calcium: 8.6 mg/dL — ABNORMAL LOW (ref 8.9–10.3)
Chloride: 95 mmol/L — ABNORMAL LOW (ref 101–111)
Creatinine, Ser: 0.71 mg/dL (ref 0.44–1.00)
GFR calc Af Amer: >60 mL/min (ref >60)
GFR calc non Af Amer: >60 mL/min (ref >60)
Glucose, Bld: 153 mg/dL — ABNORMAL HIGH (ref 65–99)
Potassium: 3.3 mmol/L — ABNORMAL LOW (ref 3.5–5.1)
Sodium: 133 mmol/L — ABNORMAL LOW (ref 135–145)
Total Bilirubin: 2.3 mg/dL — ABNORMAL HIGH (ref 0.3–1.2)
Total Protein: 6.4 g/dL — ABNORMAL LOW (ref 6.5–8.1)

## 2016-07-27 LAB — CBC WITH DIFFERENTIAL/PLATELET
Basophils Absolute: 0 10*3/uL (ref 0–0.1)
Basophils Relative: 0 %
Eosinophils Absolute: 0 10*3/uL (ref 0–0.7)
Eosinophils Relative: 0 %
HCT: 44.1 % (ref 35.0–47.0)
Hemoglobin: 15 g/dL (ref 12.0–16.0)
Lymphocytes Relative: 4 %
Lymphs Abs: 0.6 10*3/uL — ABNORMAL LOW (ref 1.0–3.6)
MCH: 29.5 pg (ref 26.0–34.0)
MCHC: 34.1 g/dL (ref 32.0–36.0)
MCV: 86.5 fL (ref 80.0–100.0)
Monocytes Absolute: 0.7 10*3/uL (ref 0.2–0.9)
Monocytes Relative: 5 %
Neutro Abs: 12.4 10*3/uL — ABNORMAL HIGH (ref 1.4–6.5)
Neutrophils Relative %: 91 %
Platelets: 161 10*3/uL (ref 150–440)
RBC: 5.09 MIL/uL (ref 3.80–5.20)
RDW: 13.9 % (ref 11.5–14.5)
WBC: 13.8 10*3/uL — ABNORMAL HIGH (ref 3.6–11.0)

## 2016-07-27 MED ORDER — ONDANSETRON 8 MG PO TBDP: 8.0000 mg | ORAL_TABLET | Freq: Three times a day (TID) | ORAL | 1 refills | Status: DC | PRN

## 2016-07-27 MED ORDER — POTASSIUM CHLORIDE CRYS ER 20 MEQ PO TBCR: 20.0000 meq | EXTENDED_RELEASE_TABLET | Freq: Every day | ORAL | 0 refills | Status: AC

## 2016-07-27 NOTE — Progress Notes (Signed)
Hematology/Oncology Consult note [facility]  Telephone:([phone] [phone]  Patient Care Team: Kirk Ruths, MD as PCP - General (Internal Medicine) [provider] Barrie Dunker, MD (Dermatology)   Name of the patient: Jessica [name]  [id number]  September 17, 1955   Date of visit: 07/27/16  Diagnosis- Grade IV GBM s/p resection  Chief complaint/ Reason for visit- weekly assessment on temodar  Heme/Onc history: The patient was in her usual state of good health until 05/2016 when she experienced symptoms of confusion and difficulty with simple math at work up. She is a Scientist, water quality. She was unable to find her car in the parking lot at [employer]. She drove into a parked car due to poor left peripheral vision.  She presented to [facility] on 06/18/2016 with acute migraine headaches with associated nausea and vomiting.  Head CT without contrast revealed a 4.9 x 6.7 x 5.5 cm right parietal occipital mass consistent with primary brain neoplasm. There was a 7 mm right to left midline shift with a right temporal horn entrapment. She was transferred to [facility]. 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  She was referred to Dr. Derrill Memo at [facility].  Head MRI at [facility] on 07/01/2016 revealed a 5.7 x 4.5 cm heterogeneously enhancing T2 hyperintense lesion in the right is posterior medial temporal lobe with extension into the adjacent right parietal and occipital lobes.  There was a separate enhancing component along the ependymal surface at the body of the right lateral ventricle extending into the centrum semiovale. There was marked mass effect on the atrium and occipital horn of the right lateral ventricle with slight increase in dilation of the temporal horn of the right lateral ventricle, consistent with at least partial entrapment. There was stable dilation of the left lateral ventricle.  There was no  hemorrhage or large acute cortical infarction.   She underwent BrainLab-guided right temporal occipital craniotomy and resection of the mass by Dr. Derrill Memo and Dr. Deetta Perla on 07/02/2016.  Pathology revealed glioblastoma, WHO grade IV.  She tolerated surgery well. She was discharged home on Keppra and a Decadron taper. She denied any history of seizures.  Head MRI at [facility] on 07/02/2016 revealed substantial resection of the mass in the right cerebral hemisphere with residual disease disease. There was T2/FLAIR signal hyperintensity along the margins of the resection cavity. There was focal masslike T2/FLAIR signal hyperintensity and enhancement in the right frontal periventricular white matter, and within the splenium of the corpus callosum compatible with residual disease. There was enhancement along the margins of the resection cavity, which appeared nodular at the posterolateral aspect.  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  She presented to [facility] ER on 07/07/2016 with nausea and vomiting associated with her Decadron taper. At that time she was on Decadron 4 mg TID x 1 day. She states she was also dehydrated. Steroid dosing was increased to 8 mg TID.  She was started on a new Decadron taper.  The patient was seen at the [facility] on 07/09/2016. At that time she was fatigued. She had persistent left homonymous hemianopsia. She was sleeping 16 hours a day. She was taking Tylenol 3 times a day for headache.  She denied any neurologic symptoms.  Recommendation was for concurrent Temodar and radiation.  The patient was seen by Dr.Chrystal on 07/14/2016.  Plan is for 6000 cGy over 6 weeks using I MRT  treatment planning and delivery. CT simulation is scheduled for the week of 07/27/2016 to allow additional time for healing of her craniotomy scar.  Interval history- her appetite is poor and she is quite fatigued. She will get ct simulation  tomorrow. Also reports pain in her buttocks. Continues to have memory issues  ECOG PS- 3 Pain scale- 0 Opioid associated constipation- no  Review of systems- Review of Systems  Constitutional: Positive for malaise/fatigue. Negative for chills, fever and weight loss.  HENT: Negative for congestion, ear discharge and nosebleeds.   Eyes: Negative for blurred vision.  Respiratory: Negative for cough, hemoptysis, sputum production, shortness of breath and wheezing.   Cardiovascular: Negative for chest pain, palpitations, orthopnea and claudication.  Gastrointestinal: Negative for abdominal pain, blood in stool, constipation, diarrhea, heartburn, melena, nausea and vomiting.  Genitourinary: Negative for dysuria, flank pain, frequency, hematuria and urgency.  Musculoskeletal: Negative for back pain, joint pain and myalgias.  Skin: Negative for rash.  Neurological: Positive for weakness. Negative for dizziness, tingling, focal weakness, seizures and headaches.  Endo/Heme/Allergies: Does not bruise/bleed easily.  Psychiatric/Behavioral: Negative for depression and suicidal ideas. The patient does not have insomnia.        Allergies  Allergen Reactions  . Augmentin [Amoxicillin-Pot Clavulanate] Nausea And Vomiting  . Compazine  [Prochlorperazine]     swelling, SOB.  Marland Kitchen Oxycodone Nausea Only     Past Medical History:  Diagnosis Date  . Asthma   . Elevated cholesterol   . Glioblastoma ([place])   . History of chicken pox   . History of measles   . History of mumps      Past Surgical History:  Procedure Laterality Date  . CT Scan of head  2009   negative  . FOOT SURGERY    . LAPAROSCOPY     abdominal endometriosis  . TUBAL LIGATION  1984    Social History   Social History  . Marital status: Married    Spouse name: N/A  . Number of children: 2  . Years of education: N/A   Occupational History  . Manager     [employer]   Social History Main Topics  .  Smoking status: Never Smoker  . Smokeless tobacco: Never Used  . Alcohol use 0.0 oz/week     Comment: occasional use  . Drug use: No  . Sexual activity: Not on file   Other Topics Concern  . Not on file   Social History Narrative  . No narrative on file    Family History  Problem Relation Age of Onset  . Breast cancer Mother 57  . Depression Mother   . Hyperlipidemia Mother   . Hypothyroidism Mother   . Hypertension Mother   . Diabetes Father   . Hypertension Father   . Hypertension Brother   . Hypertension Sister      Current Outpatient Prescriptions:  .  acetaminophen (TYLENOL) 325 MG tablet, Take 975 mg by mouth every 6 (six) hours as needed., Disp: , Rfl:  .  atorvastatin (LIPITOR) 40 MG tablet, TAKE 1 TABLET DAILY, Disp: 90 tablet, Rfl: 4 .  bacitracin 500 UNIT/GM ointment, Apply 1 application topically daily., Disp: , Rfl:  .  buPROPion (WELLBUTRIN XL) 300 MG 24 hr tablet, Take 1 tablet (300 mg total) by mouth daily., Disp: 90 tablet, Rfl: 3 .  busPIRone (BUSPAR) 5 MG tablet, Take 1 tablet (5 mg total) by mouth 3 (three) times daily. (Patient not taking: Reported on 07/07/2016), Disp:  270 tablet, Rfl: 3 .  dexamethasone (DECADRON) 4 MG tablet, DECADRON LOW TAPER: Decadron 4mg  four times a day x 5 days; then 4mg  three times a day x 1 day; then 4mg  twice a day x 1 day; then 2mg  twice a day x 1 day; then 2mg  once a day x 1 day and STOP, Disp: , Rfl:  .  fluticasone (FLONASE) 50 MCG/ACT nasal spray, SHAKE LIQUID AND USE 2 SPRAYS IN EACH NOSTRIL DAILY, Disp: 16 g, Rfl: 5 .  HYDROcodone-homatropine (HYCODAN) 5-1.5 MG/5ML syrup, 5 ml 4-6 hours as needed for cough (Patient not taking: Reported on 07/07/2016), Disp: 240 mL, Rfl: 0 .  levETIRAcetam (KEPPRA) 1000 MG tablet, Take 1,000 mg by mouth See admin instructions. tk 1000mg  bid x 10 days, if no seizures then decrease to 500mg  bid for 10 days, then if no seizures may stop, Disp: , Rfl:  .  mometasone (NASONEX) 50 MCG/ACT nasal  spray, Place 2 sprays into the nose daily., Disp: , Rfl:  .  montelukast (SINGULAIR) 10 MG tablet, Take 10 mg by mouth at bedtime., Disp: , Rfl:  .  omeprazole (PRILOSEC) 20 MG capsule, Take 1 capsule (20 mg total) by mouth daily., Disp: 90 capsule, Rfl: 3 .  ondansetron (ZOFRAN-ODT) 8 MG disintegrating tablet, Take 8 mg by mouth every 8 (eight) hours as needed for nausea or vomiting., Disp: , Rfl:  .  oxyCODONE (OXY IR/ROXICODONE) 5 MG immediate release tablet, Take 5 mg by mouth every 6 (six) hours as needed., Disp: , Rfl:  .  polyethylene glycol (MIRALAX / GLYCOLAX) packet, Take 17 g by mouth daily as needed., Disp: , Rfl:  .  sennosides-docusate sodium (SENOKOT-S) 8.6-50 MG tablet, Take 2 tablets by mouth 2 (two) times daily., Disp: , Rfl:  .  sertraline (ZOLOFT) 25 MG tablet, Take by mouth., Disp: , Rfl:  .  solifenacin (VESICARE) 10 MG tablet, Take by mouth daily., Disp: , Rfl:  .  sulfamethoxazole-trimethoprim (BACTRIM DS,SEPTRA DS) 800-160 MG tablet, Take 1 tablet by mouth 3 (three) times a week., Disp: 30 tablet, Rfl: 0 .  temozolomide (TEMODAR) 100 MG capsule, May take on an empty stomach or at bedtime to decrease nausea & vomiting., Disp: 42 capsule, Rfl: 0 .  temozolomide (TEMODAR) 20 MG capsule, Take 2 capsules (40 mg total) by mouth daily. May take on an empty stomach or at bedtime to decrease nausea & vomiting., Disp: 84 capsule, Rfl: 0 .  traZODone (DESYREL) 50 MG tablet, Take 50-100 mg by mouth at bedtime., Disp: , Rfl:  .  zolpidem (AMBIEN) 5 MG tablet, TAKE 1 TABLET BY MOUTH EVERY NIGHT AT BEDTIME AS NEEDED FOR INSOMNIA (Patient not taking: Reported on 07/07/2016), Disp: 30 tablet, Rfl: 5  Physical exam:  Vitals:   07/27/16 1206  BP: 127/84  Pulse: 94  Resp: 18  Temp: 98.3 F (36.8 C)  TempSrc: Tympanic   Physical Exam  Constitutional: She is oriented to person, place, and time.  Appears fatigued. Sitting in a wheechair  HENT:  Head: Normocephalic and atraumatic.  Eyes:  EOM are normal. Pupils are equal, round, and reactive to light.  Neck: Normal range of motion.  Cardiovascular: Normal rate, regular rhythm and normal heart sounds.   Pulmonary/Chest: Effort normal and breath sounds normal.  Abdominal: Soft. Bowel sounds are normal.  Neurological: She is alert and oriented to person, place, and time.  Skin: Skin is warm and dry.  There is grade 1 decubitus ulcer noted along the natal cleft.  There is erythema and tiny ulcerations noted along the cleft     CMP Latest Ref Rng & Units 07/07/2016  Glucose 65 - 99 mg/dL 150(H)  BUN 6 - 20 mg/dL 19  Creatinine 0.44 - 1.00 mg/dL 0.80  Sodium 135 - 145 mmol/L 133(L)  Potassium 3.5 - 5.1 mmol/L 4.1  Chloride 101 - 111 mmol/L 96(L)  CO2 22 - 32 mmol/L 27  Calcium 8.9 - 10.3 mg/dL 9.0  Total Protein 6.5 - 8.1 g/dL 7.6  Total Bilirubin 0.3 - 1.2 mg/dL 2.1(H)  Alkaline Phos 38 - 126 U/L 81  AST 15 - 41 U/L 17  ALT 14 - 54 U/L 19   CBC Latest Ref Rng & Units 07/07/2016  WBC 3.6 - 11.0 K/uL 15.4(H)  Hemoglobin 12.0 - 16.0 g/dL 15.9  Hematocrit 35.0 - 47.0 % 45.2  Platelets 150 - 440 K/uL 300    No images are attached to the encounter.  Ct Head Wo Contrast  Result Date: 07/07/2016 CLINICAL DATA:  Vomiting and nausea EXAM: CT HEAD WITHOUT CONTRAST TECHNIQUE: Contiguous axial images were obtained from the base of the skull through the vertex without intravenous contrast. COMPARISON:  06/18/2016, 08/02/2009 FINDINGS: Brain: Since the prior CT, patient has undergone right temp oral parietal craniotomy and resection of a mass from the right parietal and occipital lobes. Small amount of hemorrhage at the periphery of the surgical cavity. New low-density within the white matter of the right parietal and occipital lobes consistent with edema. New intraventricular air and small amount of hemorrhage in the right lateral ventricle felt secondary to recent surgery. Persistent 7 mm midline shift to the left. The ventricles do not  appear enlarged since the prior study. Vascular: No hyperdense vessels.  No unexpected calcifications. Skull: New right craniotomy changes.  No fracture. Sinuses/Orbits: No acute abnormality Other: Gas and fluid within the right scalp soft tissues overlying the craniotomy site consistent with recent postsurgical status. IMPRESSION: 1. Interval right craniotomy changes with for section of the previously noted large mass in the right parietal and occipital lobes. Small amount of hemorrhage at the periphery of the surgical cavity and within the right lateral ventricle, felt postoperative. Persistent 7 mm midline shift to the left, overall stable ventricle size since the prior study 2. Scattered foci of intracranial air and intraventricular air, also consistent with recent surgery. New edema within the right parietal and occipital lobes. 3. Fluid and gas bubbles within the right scalp soft tissues also fell consistent with recent surgery. Electronically Signed   By: Donavan Foil M.D.   On: 07/07/2016 18:43     Assessment and plan- Patient is a 61 y.o. female with newly diagnosed Grade IV GBM s/p resection  1. Patient will receive her temodar in 2 days and I again emphasized to her husband she needs to take it on the 1st day of RT and take it daily. She will also start bactrim prophyaxis thrice a week along with temodar. She will continue to be on steroid taper as outlined in Dr. Kem Parkinson prior note  2. Refill anti nausea meds today  3. Hypokalemia- start 20 meq oral potassium daily for 1 week. Repeat bmp next week  4. Wound care consult for decubitus ulcer  rtc in 1 week to see Dr. Grayland Ormond and check cbc, bmp as planned    Visit Diagnosis 1. Glioblastoma ([place])      Dr. Randa Evens, MD, MPH [facility] Pager- [phone] 07/27/2016 11:37 AM

## 2016-07-27 NOTE — Progress Notes (Signed)
Patient states she is having some indigestion and nausea at times.  She is eating okay. Patient states she is almost out of antiemetic, asking for refill.  States she has a sore area on her tailbone from sitting, but no skin breakdown.

## 2016-07-28 ENCOUNTER — Ambulatory Visit
Admission: RE | Admit: 2016-07-28 | Discharge: 2016-07-28 | Disposition: A | Discharge status: Home / Self Care | Payer: BLUE CROSS/BLUE SHIELD | Source: Ambulatory Visit | Attending: Radiation Oncology | Admitting: Radiation Oncology

## 2016-07-28 ENCOUNTER — Other Ambulatory Visit: Payer: Self-pay | Admitting: *Deleted

## 2016-07-28 ENCOUNTER — Inpatient Hospital Stay: Payer: BLUE CROSS/BLUE SHIELD

## 2016-07-28 DIAGNOSIS — C719 Malignant neoplasm of brain, unspecified: Secondary | ICD-10-CM

## 2016-07-28 DIAGNOSIS — S31000A Unspecified open wound of lower back and pelvis without penetration into retroperitoneum, initial encounter: Secondary | ICD-10-CM

## 2016-07-30 DIAGNOSIS — C719 Malignant neoplasm of brain, unspecified: Secondary | ICD-10-CM | POA: Diagnosis not present

## 2016-08-03 ENCOUNTER — Other Ambulatory Visit: Payer: BLUE CROSS/BLUE SHIELD

## 2016-08-03 ENCOUNTER — Ambulatory Visit: Payer: BLUE CROSS/BLUE SHIELD | Admitting: Oncology

## 2016-08-04 ENCOUNTER — Ambulatory Visit
Admission: RE | Admit: 2016-08-04 | Discharge: 2016-08-04 | Disposition: A | Discharge status: Home / Self Care | Payer: BLUE CROSS/BLUE SHIELD | Source: Ambulatory Visit | Attending: Radiation Oncology | Admitting: Radiation Oncology

## 2016-08-06 ENCOUNTER — Ambulatory Visit
Admission: RE | Admit: 2016-08-06 | Discharge: 2016-08-06 | Disposition: A | Discharge status: Home / Self Care | Payer: BLUE CROSS/BLUE SHIELD | Source: Ambulatory Visit | Attending: Radiation Oncology | Admitting: Radiation Oncology

## 2016-08-06 DIAGNOSIS — C719 Malignant neoplasm of brain, unspecified: Secondary | ICD-10-CM | POA: Diagnosis not present

## 2016-08-07 ENCOUNTER — Ambulatory Visit: Admission: RE | Admit: 2016-08-07 | Payer: BLUE CROSS/BLUE SHIELD | Source: Ambulatory Visit

## 2016-08-07 ENCOUNTER — Ambulatory Visit: Payer: BLUE CROSS/BLUE SHIELD

## 2016-08-07 ENCOUNTER — Other Ambulatory Visit: Payer: Self-pay | Admitting: *Deleted

## 2016-08-07 MED ORDER — ALPRAZOLAM 1 MG PO TABS: 1.0000 mg | ORAL_TABLET | Freq: Every day | ORAL | 0 refills | Status: DC | PRN

## 2016-08-10 ENCOUNTER — Inpatient Hospital Stay: Payer: BLUE CROSS/BLUE SHIELD | Attending: Hematology and Oncology

## 2016-08-10 ENCOUNTER — Inpatient Hospital Stay (HOSPITAL_BASED_OUTPATIENT_CLINIC_OR_DEPARTMENT_OTHER): Payer: BLUE CROSS/BLUE SHIELD | Admitting: Hematology and Oncology

## 2016-08-10 ENCOUNTER — Other Ambulatory Visit: Payer: Self-pay | Admitting: *Deleted

## 2016-08-10 ENCOUNTER — Encounter: Payer: Self-pay | Admitting: Hematology and Oncology

## 2016-08-10 ENCOUNTER — Ambulatory Visit
Admission: RE | Admit: 2016-08-10 | Discharge: 2016-08-10 | Disposition: A | Discharge status: Home / Self Care | Payer: BLUE CROSS/BLUE SHIELD | Source: Ambulatory Visit | Attending: Radiation Oncology | Admitting: Radiation Oncology

## 2016-08-10 VITALS — BP 110/75 | HR 105 | Temp 98.3°F | Resp 18

## 2016-08-10 DIAGNOSIS — C719 Malignant neoplasm of brain, unspecified: Secondary | ICD-10-CM | POA: Diagnosis not present

## 2016-08-10 DIAGNOSIS — Z792 Long term (current) use of antibiotics: Secondary | ICD-10-CM | POA: Insufficient documentation

## 2016-08-10 DIAGNOSIS — L8991 Pressure ulcer of unspecified site, stage 1: Secondary | ICD-10-CM | POA: Insufficient documentation

## 2016-08-10 DIAGNOSIS — Z803 Family history of malignant neoplasm of breast: Secondary | ICD-10-CM | POA: Insufficient documentation

## 2016-08-10 DIAGNOSIS — R5383 Other fatigue: Secondary | ICD-10-CM

## 2016-08-10 DIAGNOSIS — Z79899 Other long term (current) drug therapy: Secondary | ICD-10-CM

## 2016-08-10 DIAGNOSIS — L8941 Pressure ulcer of contiguous site of back, buttock and hip, stage 1: Secondary | ICD-10-CM

## 2016-08-10 DIAGNOSIS — H5462 Unqualified visual loss, left eye, normal vision right eye: Secondary | ICD-10-CM

## 2016-08-10 DIAGNOSIS — C718 Malignant neoplasm of overlapping sites of brain: Secondary | ICD-10-CM

## 2016-08-10 DIAGNOSIS — J45909 Unspecified asthma, uncomplicated: Secondary | ICD-10-CM | POA: Insufficient documentation

## 2016-08-10 DIAGNOSIS — E7800 Pure hypercholesterolemia, unspecified: Secondary | ICD-10-CM | POA: Diagnosis not present

## 2016-08-10 DIAGNOSIS — L89891 Pressure ulcer of other site, stage 1: Secondary | ICD-10-CM | POA: Diagnosis not present

## 2016-08-10 DIAGNOSIS — Z880 Allergy status to penicillin: Secondary | ICD-10-CM | POA: Insufficient documentation

## 2016-08-10 DIAGNOSIS — Z8619 Personal history of other infectious and parasitic diseases: Secondary | ICD-10-CM | POA: Diagnosis not present

## 2016-08-10 DIAGNOSIS — H53462 Homonymous bilateral field defects, left side: Secondary | ICD-10-CM | POA: Diagnosis not present

## 2016-08-10 LAB — COMPREHENSIVE METABOLIC PANEL
ALT: 38 U/L (ref 14–54)
AST: 18 U/L (ref 15–41)
Albumin: 3.4 g/dL — ABNORMAL LOW (ref 3.5–5.0)
Alkaline Phosphatase: 95 U/L (ref 38–126)
Anion gap: 9 (ref 5–15)
BUN: 8 mg/dL (ref 6–20)
CO2: 28 mmol/L (ref 22–32)
Calcium: 8.8 mg/dL — ABNORMAL LOW (ref 8.9–10.3)
Chloride: 99 mmol/L — ABNORMAL LOW (ref 101–111)
Creatinine, Ser: 0.65 mg/dL (ref 0.44–1.00)
GFR calc Af Amer: >60 mL/min (ref >60)
GFR calc non Af Amer: >60 mL/min (ref >60)
Glucose, Bld: 103 mg/dL — ABNORMAL HIGH (ref 65–99)
Potassium: 4.1 mmol/L (ref 3.5–5.1)
Sodium: 136 mmol/L (ref 135–145)
Total Bilirubin: 1.3 mg/dL — ABNORMAL HIGH (ref 0.3–1.2)
Total Protein: 6.5 g/dL (ref 6.5–8.1)

## 2016-08-10 LAB — CBC WITH DIFFERENTIAL/PLATELET
Basophils Absolute: 0 10*3/uL (ref 0–0.1)
Basophils Relative: 0 %
Eosinophils Absolute: 0 10*3/uL (ref 0–0.7)
Eosinophils Relative: 0 %
HCT: 39.6 % (ref 35.0–47.0)
Hemoglobin: 14.1 g/dL (ref 12.0–16.0)
Lymphocytes Relative: 15 %
Lymphs Abs: 1.2 10*3/uL (ref 1.0–3.6)
MCH: 31.4 pg (ref 26.0–34.0)
MCHC: 35.6 g/dL (ref 32.0–36.0)
MCV: 88.4 fL (ref 80.0–100.0)
Monocytes Absolute: 0.7 10*3/uL (ref 0.2–0.9)
Monocytes Relative: 8 %
Neutro Abs: 6.5 10*3/uL (ref 1.4–6.5)
Neutrophils Relative %: 77 %
Platelets: 293 10*3/uL (ref 150–440)
RBC: 4.49 MIL/uL (ref 3.80–5.20)
RDW: 14.6 % — ABNORMAL HIGH (ref 11.5–14.5)
WBC: 8.5 10*3/uL (ref 3.6–11.0)

## 2016-08-10 MED ORDER — DEXAMETHASONE 4 MG PO TABS: 4.0000 mg | ORAL_TABLET | Freq: Every day | ORAL | 0 refills | Status: DC

## 2016-08-10 NOTE — Progress Notes (Signed)
[facility] day:  08/10/2016  Chief Complaint: Jessica [name] is a 61 y.o. female with a glioblastoma who is seen for assessment on concurrent Temodar and radiation.  HPI:  The patient was last seen in the medical oncology clinic on 07/17/2016.  At that time, she was seen for new patient assessment by me.  Symptomatically, she was fatigued.  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  She needed assistance with ADLs.  She had thrush secondary to Decadron.  She was encouraged to pick up her Nystatin or Magic Mouthwash.  Sutures were removed.  We discussed the plan for Temodar and radiation.  A physical therapy consult was requested.  She saw Dr Janese Banks in my absence on 07/27/2016.  At that time, she had a grade I decubitus ulcer.  Wound care was consulted, and an appointment is scheduled for 08/13/16.  She has received her Temodar, however she has not started the medication, or the prescribed Septra, due to misunderstanding of dosing instruction.   She began cranial radiation on 08/06/2016. Patient did not have radiation treatment on Friday (08/07/2016) due to issues with her mask fit.  Adjustments were made and patient was prescribed Xanax. Patient took the prescribed anxiolytic prior to arrival today. Plans are for her to get treatment today (08/10/2016).  Symptomatically, patient reports that she is doing well since starting her radiation treatments. She reports that her energy level has not improved. She reports that she is making a conscious effort to "move around more". Patient requires assistance from her husband with her ADLs.  Her thrush has improved.  The patient's husband stopped her Decadron yesterday.    Past Medical History:  Diagnosis Date  . Asthma   . Elevated cholesterol   . Glioblastoma ([place])   . History of chicken pox   . History of measles   . History of mumps     Past  Surgical History:  Procedure Laterality Date  . CT Scan of head  2009   negative  . FOOT SURGERY    . LAPAROSCOPY     abdominal endometriosis  . TUBAL LIGATION  1984    Family History  Problem Relation Age of Onset  . Breast cancer Mother 48  . Depression Mother   . Hyperlipidemia Mother   . Hypothyroidism Mother   . Hypertension Mother   . Diabetes Father   . Hypertension Father   . Hypertension Brother   . Hypertension Sister     Social History:  reports that she has never smoked. She has never used smokeless tobacco. She reports that she drinks alcohol. She reports that she does not use drugs.  She has 2 children (age 65 and 71).  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  She lives in [place].  The patient is accompanied by her husband, Yvone Neu, today.  Allergies:  Allergies  Allergen Reactions  . Augmentin [Amoxicillin-Pot Clavulanate] Nausea And Vomiting  . Compazine  [Prochlorperazine]     swelling, SOB.  Marland Kitchen Oxycodone Nausea Only    Current Medications: Current Outpatient Prescriptions  Medication Sig Dispense Refill  . acetaminophen (TYLENOL) 325 MG tablet Take 975 mg by mouth every 6 (six) hours as needed.    . ALPRAZolam (XANAX) 1 MG tablet Take 1 tablet (1 mg total) by mouth daily as needed for anxiety. 30 tablet 0  . atorvastatin (LIPITOR) 40 MG tablet TAKE  1 TABLET DAILY 90 tablet 4  . bacitracin 500 UNIT/GM ointment Apply 1 application topically daily.    . fluticasone (FLONASE) 50 MCG/ACT nasal spray SHAKE LIQUID AND USE 2 SPRAYS IN EACH NOSTRIL DAILY 16 g 5  . levETIRAcetam (KEPPRA) 1000 MG tablet Take 1,000 mg by mouth See admin instructions. tk 1021m bid x 10 days, if no seizures then decrease to 5020mbid for 10 days, then if no seizures may stop    . mometasone (NASONEX) 50 MCG/ACT nasal spray Place 2 sprays into the nose daily.    . montelukast (SINGULAIR) 10 MG tablet Take 10 mg by mouth at bedtime.    . Marland Kitchenmeprazole (PRILOSEC) 20 MG  capsule Take 1 capsule (20 mg total) by mouth daily. 90 capsule 3  . ondansetron (ZOFRAN-ODT) 8 MG disintegrating tablet Take 1 tablet (8 mg total) by mouth every 8 (eight) hours as needed for nausea or vomiting. 30 tablet 1  . oxyCODONE (OXY IR/ROXICODONE) 5 MG immediate release tablet Take 5 mg by mouth every 6 (six) hours as needed.    . polyethylene glycol (MIRALAX / GLYCOLAX) packet Take 17 g by mouth daily as needed.    . potassium chloride SA (K-DUR,KLOR-CON) 20 MEQ tablet Take 1 tablet (20 mEq total) by mouth daily. 7 tablet 0  . sennosides-docusate sodium (SENOKOT-S) 8.6-50 MG tablet Take 2 tablets by mouth 2 (two) times daily.    . sertraline (ZOLOFT) 25 MG tablet Take by mouth.    . solifenacin (VESICARE) 10 MG tablet Take by mouth daily.    . traZODone (DESYREL) 50 MG tablet Take 50-100 mg by mouth at bedtime.    . Marland KitchenuPROPion (WELLBUTRIN XL) 300 MG 24 hr tablet Take 1 tablet (300 mg total) by mouth daily. (Patient not taking: Reported on 07/27/2016) 90 tablet 3  . busPIRone (BUSPAR) 5 MG tablet Take 1 tablet (5 mg total) by mouth 3 (three) times daily. (Patient not taking: Reported on 07/07/2016) 270 tablet 3  . dexamethasone (DECADRON) 4 MG tablet Take 1 tablet (4 mg total) by mouth daily. 50 tablet 0  . HYDROcodone-homatropine (HYCODAN) 5-1.5 MG/5ML syrup 5 ml 4-6 hours as needed for cough (Patient not taking: Reported on 07/07/2016) 240 mL 0  . sulfamethoxazole-trimethoprim (BACTRIM DS,SEPTRA DS) 800-160 MG tablet Take 1 tablet by mouth 3 (three) times a week. (Patient not taking: Reported on 08/10/2016) 30 tablet 0  . temozolomide (TEMODAR) 100 MG capsule May take on an empty stomach or at bedtime to decrease nausea & vomiting. (Patient not taking: Reported on 07/27/2016) 42 capsule 0  . temozolomide (TEMODAR) 20 MG capsule Take 2 capsules (40 mg total) by mouth daily. May take on an empty stomach or at bedtime to decrease nausea & vomiting. (Patient not taking: Reported on 07/27/2016) 84  capsule 0  . zolpidem (AMBIEN) 5 MG tablet TAKE 1 TABLET BY MOUTH EVERY NIGHT AT BEDTIME AS NEEDED FOR INSOMNIA (Patient not taking: Reported on 07/07/2016) 30 tablet 5   No current facility-administered medications for this visit.     Review of Systems:  GENERAL:  Fatigue.  No fevers or sweats.  Weight loss prior to diagnosis.  Previously weighed 170-180 pounds. PERFORMANCE STATUS (ECOG):  2-3 HEENT:  Left sided vision loss.  Thrush.  No runny nose, sore throat, mouth sores or tenderness. Lungs: No shortness of breath or cough.  No hemoptysis. Cardiac:  No chest pain, palpitations, orthopnea, or PND. GI:  No nausea, vomiting, diarrhea, constipation, melena or hematochezia.  Pain  pills cause emesis. GU:  No urgency, frequency, dysuria, or hematuria. Musculoskeletal:  Legs achy.  No back pain.  No joint pain.  No muscle tenderness. Extremities:  Legs weak.  No pain or swelling. Skin:  No rashes or skin changes. Neuro:  Headache, mild.  Left homonymous hemianopsia.  No focal numbness, balance or coordination issues. Endocrine:  No diabetes, thyroid issues, hot flashes or night sweats. Psych:  No mood changes, depression or anxiety. Pain:  No focal pain. Review of systems:  All other systems reviewed and found to be negative.  Physical Exam: Blood pressure 110/75, pulse (!) 105, temperature 98.3 F (36.8 C), temperature source Tympanic, resp. rate 18. GENERAL:  Well developed, well nourished, woman sitting comfortably in a wheelchair in the exam room in no acute distress.  2-3 person assist onto exam table. MENTAL STATUS:  Alert and oriented to person, place and time. HEAD:  Brown slightly disheveled hair.  Large incision, well healed.  Normocephalic, atraumatic, face symmetric, no Cushingoid features. EYES:  Glasses.  Blue eyes.  Pupils equal round and reactive to light and accomodation.  No conjunctivitis or scleral icterus. ENT:  No thrush.  Tongue normal. Mucous membranes moist.   RESPIRATORY:  Clear to auscultation without rales, wheezes or rhonchi. CARDIOVASCULAR:  Regular rate and rhythm without murmur, rub or gallop. ABDOMEN:  Soft, non-tender, with active bowel sounds, and no hepatosplenomegaly.  No masses. SKIN:  Grade I bilateral gluteal fold decubitus ulcer (5 open areas). EXTREMITIES: No edema, no skin discoloration or tenderness.  No palpable cords. LYMPH NODES: No palpable cervical, supraclavicular, axillary or inguinal adenopathy  NEUROLOGICAL: Stable. PSYCH:  Appropriate.   Appointment on 08/10/2016  Component Date Value Ref Range Status  . Sodium 08/10/2016 136  135 - 145 mmol/L Final  . Potassium 08/10/2016 4.1  3.5 - 5.1 mmol/L Final  . Chloride 08/10/2016 99* 101 - 111 mmol/L Final  . CO2 08/10/2016 28  22 - 32 mmol/L Final  . Glucose, Bld 08/10/2016 103* 65 - 99 mg/dL Final  . BUN 08/10/2016 8  6 - 20 mg/dL Final  . Creatinine, Ser 08/10/2016 0.65  0.44 - 1.00 mg/dL Final  . Calcium 08/10/2016 8.8* 8.9 - 10.3 mg/dL Final  . Total Protein 08/10/2016 6.5  6.5 - 8.1 g/dL Final  . Albumin 08/10/2016 3.4* 3.5 - 5.0 g/dL Final  . AST 08/10/2016 18  15 - 41 U/L Final  . ALT 08/10/2016 38  14 - 54 U/L Final  . Alkaline Phosphatase 08/10/2016 95  38 - 126 U/L Final  . Total Bilirubin 08/10/2016 1.3* 0.3 - 1.2 mg/dL Final  . GFR calc non Af Amer 08/10/2016 >60  >60 mL/min Final  . GFR calc Af Amer 08/10/2016 >60  >60 mL/min Final   Comment: (NOTE) The eGFR has been calculated using the CKD EPI equation. This calculation has not been validated in all clinical situations. eGFR's persistently <60 mL/min signify possible Chronic Kidney Disease.   . Anion gap 08/10/2016 9  5 - 15 Final  . WBC 08/10/2016 8.5  3.6 - 11.0 K/uL Final  . RBC 08/10/2016 4.49  3.80 - 5.20 MIL/uL Final  . Hemoglobin 08/10/2016 14.1  12.0 - 16.0 g/dL Final  . HCT 08/10/2016 39.6  35.0 - 47.0 % Final  . MCV 08/10/2016 88.4  80.0 - 100.0 fL Final  . MCH 08/10/2016 31.4  26.0  - 34.0 pg Final  . MCHC 08/10/2016 35.6  32.0 - 36.0 g/dL Final  . RDW 08/10/2016  14.6* 11.5 - 14.5 % Final  . Platelets 08/10/2016 293  150 - 440 K/uL Final  . Neutrophils Relative % 08/10/2016 77  % Final  . Neutro Abs 08/10/2016 6.5  1.4 - 6.5 K/uL Final  . Lymphocytes Relative 08/10/2016 15  % Final  . Lymphs Abs 08/10/2016 1.2  1.0 - 3.6 K/uL Final  . Monocytes Relative 08/10/2016 8  % Final  . Monocytes Absolute 08/10/2016 0.7  0.2 - 0.9 K/uL Final  . Eosinophils Relative 08/10/2016 0  % Final  . Eosinophils Absolute 08/10/2016 0.0  0 - 0.7 K/uL Final  . Basophils Relative 08/10/2016 0  % Final  . Basophils Absolute 08/10/2016 0.0  0 - 0.1 K/uL Final    Assessment:  AMESHA BAILEY is a 61 y.o. 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    Head MRI at [facility] on 07/01/2016 revealed a 5.7 x 4.5 cm heterogeneously enhancing T2 hyperintense lesion in the right is posterior medial temporal lobe with extension into the adjacent right parietal and occipital lobes.  There was a separate enhancing component along the ependymal surface at the body of the right lateral ventricle extending into the centrum semiovale. There was marked mass effect on the atrium and occipital horn of the right lateral ventricle with slight increase in dilation of the temporal horn of the right lateral ventricle, consistent with at least partial entrapment. There was stable dilation of the left lateral ventricle.  There was no hemorrhage or large acute cortical infarction.   Head MRI at [facility] on 07/02/2016 revealed substantial resection of the mass in the right cerebral hemisphere with residual disease disease. There was T2/FLAIR signal hyperintensity along the margins of the resection cavity. There was focal masslike T2/FLAIR signal hyperintensity and enhancement in the right frontal periventricular white matter, and within the splenium of the corpus callosum compatible  with residual disease. There was enhancement along the margins of the resection cavity, which appeared nodular at the posterolateral aspect.  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  She began cranial radiation on 08/06/2016.  She has not started Temodar.  She stopped her Decadron abruptly yesterday.  She had been on a Decadron taper.  Symptomatically, she is fatigued.  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  She has a gluteal fold decubitus ulcer (increased).  She needs assistance with ADLs.  Plan: 1.  Labs today:  CBC with diff, CMP. 2.  Restart Decadron 79m daily, with further tapering to be done by Dr. CBaruch Gouty 3.  Begin Temodar 75 mg/m2 po q day on day 1-42.   4.  Begin Septra DS 1 tablet every Monday, Wednesday, Friday for PCP prophylaxis. 5.  Coordinate care with Dr. CBaruch Goutyfor concurrent radiation and Temodar.  Radiation treatment scheduled for today (08/10/2016).  Today will be the second treatment 6.  Wound care consult on 08/13/2016. 7.  Resubmit physical therapy consult; patient reports she never heard from them. 8.  RTC weekly x 6 for MD assessment and labs (CBC with diff, CMP).   MLequita Asal MD  08/10/2016

## 2016-08-10 NOTE — Progress Notes (Signed)
Patient accompanied by her husband today.  She states she is having a little bit of nausea.  She has not started her Temodar but has received it.  Offers no other complaints today.

## 2016-08-11 ENCOUNTER — Ambulatory Visit
Admission: RE | Admit: 2016-08-11 | Discharge: 2016-08-11 | Disposition: A | Discharge status: Home / Self Care | Payer: BLUE CROSS/BLUE SHIELD | Source: Ambulatory Visit | Attending: Radiation Oncology | Admitting: Radiation Oncology

## 2016-08-11 DIAGNOSIS — C719 Malignant neoplasm of brain, unspecified: Secondary | ICD-10-CM | POA: Diagnosis not present

## 2016-08-12 ENCOUNTER — Ambulatory Visit
Admission: RE | Admit: 2016-08-12 | Discharge: 2016-08-12 | Disposition: A | Discharge status: Home / Self Care | Payer: BLUE CROSS/BLUE SHIELD | Source: Ambulatory Visit | Attending: Radiation Oncology | Admitting: Radiation Oncology

## 2016-08-12 DIAGNOSIS — C719 Malignant neoplasm of brain, unspecified: Secondary | ICD-10-CM | POA: Diagnosis not present

## 2016-08-13 ENCOUNTER — Ambulatory Visit
Admission: RE | Admit: 2016-08-13 | Discharge: 2016-08-13 | Disposition: A | Discharge status: Home / Self Care | Payer: BLUE CROSS/BLUE SHIELD | Source: Ambulatory Visit | Attending: Radiation Oncology | Admitting: Radiation Oncology

## 2016-08-13 ENCOUNTER — Ambulatory Visit: Payer: BLUE CROSS/BLUE SHIELD | Admitting: Physician Assistant

## 2016-08-13 DIAGNOSIS — C719 Malignant neoplasm of brain, unspecified: Secondary | ICD-10-CM | POA: Diagnosis not present

## 2016-08-14 ENCOUNTER — Ambulatory Visit
Admission: RE | Admit: 2016-08-14 | Discharge: 2016-08-14 | Disposition: A | Discharge status: Home / Self Care | Payer: BLUE CROSS/BLUE SHIELD | Source: Ambulatory Visit | Attending: Radiation Oncology | Admitting: Radiation Oncology

## 2016-08-14 DIAGNOSIS — C719 Malignant neoplasm of brain, unspecified: Secondary | ICD-10-CM | POA: Diagnosis not present

## 2016-08-16 DIAGNOSIS — L8990 Pressure ulcer of unspecified site, unspecified stage: Secondary | ICD-10-CM | POA: Insufficient documentation

## 2016-08-17 ENCOUNTER — Inpatient Hospital Stay (HOSPITAL_BASED_OUTPATIENT_CLINIC_OR_DEPARTMENT_OTHER): Payer: BLUE CROSS/BLUE SHIELD | Admitting: Hematology and Oncology

## 2016-08-17 ENCOUNTER — Encounter: Payer: Self-pay | Admitting: Hematology and Oncology

## 2016-08-17 ENCOUNTER — Inpatient Hospital Stay: Payer: BLUE CROSS/BLUE SHIELD

## 2016-08-17 ENCOUNTER — Ambulatory Visit
Admission: RE | Admit: 2016-08-17 | Discharge: 2016-08-17 | Disposition: A | Discharge status: Home / Self Care | Payer: BLUE CROSS/BLUE SHIELD | Source: Ambulatory Visit | Attending: Radiation Oncology | Admitting: Radiation Oncology

## 2016-08-17 VITALS — BP 109/76 | HR 109 | Temp 98.7°F | Resp 18

## 2016-08-17 DIAGNOSIS — R5383 Other fatigue: Secondary | ICD-10-CM

## 2016-08-17 DIAGNOSIS — J45909 Unspecified asthma, uncomplicated: Secondary | ICD-10-CM

## 2016-08-17 DIAGNOSIS — C718 Malignant neoplasm of overlapping sites of brain: Secondary | ICD-10-CM | POA: Diagnosis not present

## 2016-08-17 DIAGNOSIS — H53462 Homonymous bilateral field defects, left side: Secondary | ICD-10-CM | POA: Diagnosis not present

## 2016-08-17 DIAGNOSIS — Z880 Allergy status to penicillin: Secondary | ICD-10-CM

## 2016-08-17 DIAGNOSIS — Z8619 Personal history of other infectious and parasitic diseases: Secondary | ICD-10-CM

## 2016-08-17 DIAGNOSIS — E7800 Pure hypercholesterolemia, unspecified: Secondary | ICD-10-CM

## 2016-08-17 DIAGNOSIS — Z803 Family history of malignant neoplasm of breast: Secondary | ICD-10-CM

## 2016-08-17 DIAGNOSIS — C719 Malignant neoplasm of brain, unspecified: Secondary | ICD-10-CM | POA: Diagnosis not present

## 2016-08-17 DIAGNOSIS — Z792 Long term (current) use of antibiotics: Secondary | ICD-10-CM

## 2016-08-17 DIAGNOSIS — L89891 Pressure ulcer of other site, stage 1: Secondary | ICD-10-CM | POA: Diagnosis not present

## 2016-08-17 DIAGNOSIS — Z79899 Other long term (current) drug therapy: Secondary | ICD-10-CM

## 2016-08-17 DIAGNOSIS — L8941 Pressure ulcer of contiguous site of back, buttock and hip, stage 1: Secondary | ICD-10-CM

## 2016-08-17 LAB — CBC WITH DIFFERENTIAL/PLATELET
Basophils Absolute: 0 10*3/uL (ref 0–0.1)
Basophils Relative: 0 %
Eosinophils Absolute: 0 10*3/uL (ref 0–0.7)
Eosinophils Relative: 0 %
HCT: 38.9 % (ref 35.0–47.0)
Hemoglobin: 13.5 g/dL (ref 12.0–16.0)
Lymphocytes Relative: 6 %
Lymphs Abs: 0.8 10*3/uL — ABNORMAL LOW (ref 1.0–3.6)
MCH: 31.2 pg (ref 26.0–34.0)
MCHC: 34.8 g/dL (ref 32.0–36.0)
MCV: 89.6 fL (ref 80.0–100.0)
Monocytes Absolute: 0.5 10*3/uL (ref 0.2–0.9)
Monocytes Relative: 4 %
Neutro Abs: 11.9 10*3/uL — ABNORMAL HIGH (ref 1.4–6.5)
Neutrophils Relative %: 90 %
Platelets: 344 10*3/uL (ref 150–440)
RBC: 4.34 MIL/uL (ref 3.80–5.20)
RDW: 16.1 % — ABNORMAL HIGH (ref 11.5–14.5)
WBC: 13.2 10*3/uL — ABNORMAL HIGH (ref 3.6–11.0)

## 2016-08-17 LAB — COMPREHENSIVE METABOLIC PANEL
ALT: 58 U/L — ABNORMAL HIGH (ref 14–54)
AST: 34 U/L (ref 15–41)
Albumin: 3.7 g/dL (ref 3.5–5.0)
Alkaline Phosphatase: 86 U/L (ref 38–126)
Anion gap: 10 (ref 5–15)
BUN: 11 mg/dL (ref 6–20)
CO2: 25 mmol/L (ref 22–32)
Calcium: 9.4 mg/dL (ref 8.9–10.3)
Chloride: 99 mmol/L — ABNORMAL LOW (ref 101–111)
Creatinine, Ser: 0.78 mg/dL (ref 0.44–1.00)
GFR calc Af Amer: >60 mL/min (ref >60)
GFR calc non Af Amer: >60 mL/min (ref >60)
Glucose, Bld: 174 mg/dL — ABNORMAL HIGH (ref 65–99)
Potassium: 4.2 mmol/L (ref 3.5–5.1)
Sodium: 134 mmol/L — ABNORMAL LOW (ref 135–145)
Total Bilirubin: 0.8 mg/dL (ref 0.3–1.2)
Total Protein: 6.9 g/dL (ref 6.5–8.1)

## 2016-08-17 NOTE — Progress Notes (Addendum)
[facility] day:  08/17/2016   Chief Complaint: Jessica [name] is a 61 y.o. female with a glioblastoma who is seen for assessment on concurrent Temodar and radiation.  HPI:  The patient was last seen in the medical oncology clinic on 08/10/2016.  At that time, she had not started Temodar.  She began cranial radiation on 08/06/2016.  Her husband had stopped her Decadron abruptly.  Decadron was restarted.  She had a progressive decubitus ulcer.  Wound care and physical therapy were consulted.  She was instructed to begin Temodar and prophylactic Septra.  During the interim, she started her Temodar on 08/10/2016. She forgot to take her Temodar on Saturday, 08/15/2016, as she fell asleep.  She states that for the first few nights after starting treatment, she "tossed and turned". She has had no nausea. She is taking Bactrim every Monday, Wednesday, and Friday. She is taking Decadron 4 mg a day.  She has not seen wound care. She begins physical therapy next week. She has been better at home. She has been up and down in the recliner. She is using the bathroom independently. She is eating well.   Past Medical History:  Diagnosis Date  . Asthma   . Elevated cholesterol   . Glioblastoma ([place])   . History of chicken pox   . History of measles   . History of mumps     Past Surgical History:  Procedure Laterality Date  . CT Scan of head  2009   negative  . FOOT SURGERY    . LAPAROSCOPY     abdominal endometriosis  . TUBAL LIGATION  1984    Family History  Problem Relation Age of Onset  . Breast cancer Mother 32  . Depression Mother   . Hyperlipidemia Mother   . Hypothyroidism Mother   . Hypertension Mother   . Diabetes Father   . Hypertension Father   . Hypertension Brother   . Hypertension Sister     Social History:  reports that she has never smoked. She has never used smokeless tobacco. She reports that she drinks alcohol. She reports  that she does not use drugs.  She has 2 children (age 31 and 1).  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  She lives in [place].  The patient is accompanied by her husband, Jessica [name], and her mother, Jessica [name], today.  Allergies:  Allergies  Allergen Reactions  . Augmentin [Amoxicillin-Pot Clavulanate] Nausea And Vomiting  . Compazine  [Prochlorperazine]     swelling, SOB.  Marland Kitchen Oxycodone Nausea Only    Current Medications: Current Outpatient Prescriptions  Medication Sig Dispense Refill  . acetaminophen (TYLENOL) 325 MG tablet Take 975 mg by mouth every 6 (six) hours as needed.    . ALPRAZolam (XANAX) 1 MG tablet Take 1 tablet (1 mg total) by mouth daily as needed for anxiety. 30 tablet 0  . atorvastatin (LIPITOR) 40 MG tablet TAKE 1 TABLET DAILY 90 tablet 4  . bacitracin 500 UNIT/GM ointment Apply 1 application topically daily.    Marland Kitchen dexamethasone (DECADRON) 4 MG tablet Take 1 tablet (4 mg total) by mouth daily. 50 tablet 0  . fluticasone (FLONASE) 50 MCG/ACT nasal spray SHAKE LIQUID AND USE 2 SPRAYS IN EACH NOSTRIL DAILY 16 g 5  . levETIRAcetam (KEPPRA) 1000 MG tablet Take 1,000 mg by mouth See admin instructions. tk 1084m bid x 10 days, if no seizures then decrease to 5031mbid  for 10 days, then if no seizures may stop    . mometasone (NASONEX) 50 MCG/ACT nasal spray Place 2 sprays into the nose daily.    . montelukast (SINGULAIR) 10 MG tablet Take 10 mg by mouth at bedtime.    Marland Kitchen omeprazole (PRILOSEC) 20 MG capsule Take 1 capsule (20 mg total) by mouth daily. 90 capsule 3  . ondansetron (ZOFRAN-ODT) 8 MG disintegrating tablet Take 1 tablet (8 mg total) by mouth every 8 (eight) hours as needed for nausea or vomiting. 30 tablet 1  . oxyCODONE (OXY IR/ROXICODONE) 5 MG immediate release tablet Take 5 mg by mouth every 6 (six) hours as needed.    . polyethylene glycol (MIRALAX / GLYCOLAX) packet Take 17 g by mouth daily as needed.    . potassium chloride SA (K-DUR,KLOR-CON) 20 MEQ  tablet Take 1 tablet (20 mEq total) by mouth daily. 7 tablet 0  . sennosides-docusate sodium (SENOKOT-S) 8.6-50 MG tablet Take 2 tablets by mouth 2 (two) times daily.    . sertraline (ZOLOFT) 25 MG tablet Take by mouth.    . solifenacin (VESICARE) 10 MG tablet Take by mouth daily.    . traZODone (DESYREL) 50 MG tablet Take 50-100 mg by mouth at bedtime.    Marland Kitchen buPROPion (WELLBUTRIN XL) 300 MG 24 hr tablet Take 1 tablet (300 mg total) by mouth daily. (Patient not taking: Reported on 07/27/2016) 90 tablet 3  . busPIRone (BUSPAR) 5 MG tablet Take 1 tablet (5 mg total) by mouth 3 (three) times daily. (Patient not taking: Reported on 07/07/2016) 270 tablet 3  . HYDROcodone-homatropine (HYCODAN) 5-1.5 MG/5ML syrup 5 ml 4-6 hours as needed for cough (Patient not taking: Reported on 07/07/2016) 240 mL 0  . sulfamethoxazole-trimethoprim (BACTRIM DS,SEPTRA DS) 800-160 MG tablet Take 1 tablet by mouth 3 (three) times a week. (Patient not taking: Reported on 08/10/2016) 30 tablet 0  . temozolomide (TEMODAR) 100 MG capsule May take on an empty stomach or at bedtime to decrease nausea & vomiting. (Patient not taking: Reported on 07/27/2016) 42 capsule 0  . temozolomide (TEMODAR) 20 MG capsule Take 2 capsules (40 mg total) by mouth daily. May take on an empty stomach or at bedtime to decrease nausea & vomiting. (Patient not taking: Reported on 07/27/2016) 84 capsule 0  . zolpidem (AMBIEN) 5 MG tablet TAKE 1 TABLET BY MOUTH EVERY NIGHT AT BEDTIME AS NEEDED FOR INSOMNIA (Patient not taking: Reported on 07/07/2016) 30 tablet 5   No current facility-administered medications for this visit.     Review of Systems:  GENERAL:  Fatigue.  No fevers or sweats.  Weight loss prior to diagnosis.  Previously weighed 170-180 pounds.  No new weight. PERFORMANCE STATUS (ECOG):  2-3 HEENT:  Left sided vision loss.  No runny nose, sore throat, mouth sores or tenderness. Lungs: No shortness of breath or cough.  No hemoptysis. Cardiac:  No  chest pain, palpitations, orthopnea, or PND. GI:  No nausea, vomiting, diarrhea, constipation, melena or hematochezia.  Pain pills cause emesis. GU:  No urgency, frequency, dysuria, or hematuria. Musculoskeletal:  No back pain.  No joint pain.  No muscle tenderness. Extremities:  Legs weak.  No pain or swelling. Skin:  No rashes or skin changes. Neuro:  Left homonymous hemianopsia.  No headache, focal numbness, balance or coordination issues. Endocrine:  No diabetes, thyroid issues, hot flashes or night sweats. Psych:  No mood changes, depression or anxiety. Pain:  No focal pain. Review of systems:  All other systems reviewed  and found to be negative.  Physical Exam: Blood pressure 109/76, pulse (!) 109, temperature 98.7 F (37.1 C), temperature source Tympanic, resp. rate 18. GENERAL:  Well developed, well nourished, woman sitting comfortably in a wheelchair in the exam room in no acute distress.  She requires a 2 person assist onto the exam table. MENTAL STATUS:  Alert and oriented to person, place and time. HEAD:  Brown hair pulled back.  Large incision, well healed.  Normocephalic, atraumatic, face symmetric, no Cushingoid features. EYES:  Glasses.  Blue eyes.  Pupils equal round and reactive to light and accomodation.  No conjunctivitis or scleral icterus. ENT:  No thrush.  Tongue normal. Mucous membranes moist.  RESPIRATORY:  Clear to auscultation without rales, wheezes or rhonchi. CARDIOVASCULAR:  Regular rate and rhythm without murmur, rub or gallop. ABDOMEN:  Soft, non-tender, with active bowel sounds, and no hepatosplenomegaly.  No masses. SKIN:  Grade I bilateral gluteal fold decubitus ulcer (progressive with small open areas- 13).  Right forearm bruise.  Left knee scrape. EXTREMITIES: No edema, no skin discoloration or tenderness.  No palpable cords. LYMPH NODES: No palpable cervical, supraclavicular, axillary or inguinal adenopathy  NEUROLOGICAL: Stable.  No improvement in  visual symptoms (left homonymous hemianopsia). PSYCH:  Appropriate.   Appointment on 08/17/2016  Component Date Value Ref Range Status  . WBC 08/17/2016 13.2* 3.6 - 11.0 K/uL Final  . RBC 08/17/2016 4.34  3.80 - 5.20 MIL/uL Final  . Hemoglobin 08/17/2016 13.5  12.0 - 16.0 g/dL Final  . HCT 08/17/2016 38.9  35.0 - 47.0 % Final  . MCV 08/17/2016 89.6  80.0 - 100.0 fL Final  . MCH 08/17/2016 31.2  26.0 - 34.0 pg Final  . MCHC 08/17/2016 34.8  32.0 - 36.0 g/dL Final  . RDW 08/17/2016 16.1* 11.5 - 14.5 % Final  . Platelets 08/17/2016 344  150 - 440 K/uL Final  . Neutrophils Relative % 08/17/2016 90  % Final  . Neutro Abs 08/17/2016 11.9* 1.4 - 6.5 K/uL Final  . Lymphocytes Relative 08/17/2016 6  % Final  . Lymphs Abs 08/17/2016 0.8* 1.0 - 3.6 K/uL Final  . Monocytes Relative 08/17/2016 4  % Final  . Monocytes Absolute 08/17/2016 0.5  0.2 - 0.9 K/uL Final  . Eosinophils Relative 08/17/2016 0  % Final  . Eosinophils Absolute 08/17/2016 0.0  0 - 0.7 K/uL Final  . Basophils Relative 08/17/2016 0  % Final  . Basophils Absolute 08/17/2016 0.0  0 - 0.1 K/uL Final  . Sodium 08/17/2016 134* 135 - 145 mmol/L Final  . Potassium 08/17/2016 4.2  3.5 - 5.1 mmol/L Final  . Chloride 08/17/2016 99* 101 - 111 mmol/L Final  . CO2 08/17/2016 25  22 - 32 mmol/L Final  . Glucose, Bld 08/17/2016 174* 65 - 99 mg/dL Final  . BUN 08/17/2016 11  6 - 20 mg/dL Final  . Creatinine, Ser 08/17/2016 0.78  0.44 - 1.00 mg/dL Final  . Calcium 08/17/2016 9.4  8.9 - 10.3 mg/dL Final  . Total Protein 08/17/2016 6.9  6.5 - 8.1 g/dL Final  . Albumin 08/17/2016 3.7  3.5 - 5.0 g/dL Final  . AST 08/17/2016 34  15 - 41 U/L Final  . ALT 08/17/2016 58* 14 - 54 U/L Final  . Alkaline Phosphatase 08/17/2016 86  38 - 126 U/L Final  . Total Bilirubin 08/17/2016 0.8  0.3 - 1.2 mg/dL Final  . GFR calc non Af Amer 08/17/2016 >60  >60 mL/min Final  . GFR calc  Af Amer 08/17/2016 >60  >60 mL/min Final   Comment: (NOTE) The eGFR has been  calculated using the CKD EPI equation. This calculation has not been validated in all clinical situations. eGFR's persistently <60 mL/min signify possible Chronic Kidney Disease.   . Anion gap 08/17/2016 10  5 - 15 Final    Assessment:  Jessica [name] is a 60 y.o. 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    Head MRI at [facility] on 07/01/2016 revealed a 5.7 x 4.5 cm heterogeneously enhancing T2 hyperintense lesion in the right is posterior medial temporal lobe with extension into the adjacent right parietal and occipital lobes.  There was a separate enhancing component along the ependymal surface at the body of the right lateral ventricle extending into the centrum semiovale. There was marked mass effect on the atrium and occipital horn of the right lateral ventricle with slight increase in dilation of the temporal horn of the right lateral ventricle, consistent with at least partial entrapment. There was stable dilation of the left lateral ventricle.  There was no hemorrhage or large acute cortical infarction.   Head MRI at [facility] on 07/02/2016 revealed substantial resection of the mass in the right cerebral hemisphere with residual disease disease. There was T2/FLAIR signal hyperintensity along the margins of the resection cavity. There was focal masslike T2/FLAIR signal hyperintensity and enhancement in the right frontal periventricular white matter, and within the splenium of the corpus callosum compatible with residual disease. There was enhancement along the margins of the resection cavity, which appeared nodular at the posterolateral aspect.  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  She began cranial radiation on 08/06/2016.  She began Temodar on 08/10/2016.  She missed 1 dose.  She is on Decadron 4 mg a day.  Symptomatically, she feels a little better.  She has stable visual loss (left homonymous hemianopsia) secondary to right  occipital lobe tumor and infarct.  She has a gluteal fold decubitus ulcer (increased).  She needs assistance with ADLs.  Plan: 1.  Labs today:  CBC with diff, CMP. 2.  Continue Temodar 140 mg daily (day 1-42). 3.  Continue Decadron 4 mg a day. 4.  Continue radiation every Monday - Friday. 5.  Continue Septra DS 1 tablet every Monday, Wednesday, Friday for PCP prophylaxis. 6.  Reschedule wound care consult. 7.  RTC weekly x 5 for MD assessment and labs (CBC with diff, CMP).   Lequita Asal, MD  08/10/2016.td, 2:43 PM

## 2016-08-17 NOTE — Progress Notes (Signed)
Patient states she had a right sided headache yesterday.  No pain today.  Patient is accompanied by her husband and mother today.

## 2016-08-18 ENCOUNTER — Telehealth: Payer: Self-pay | Admitting: *Deleted

## 2016-08-18 ENCOUNTER — Ambulatory Visit
Admission: RE | Admit: 2016-08-18 | Discharge: 2016-08-18 | Disposition: A | Discharge status: Home / Self Care | Payer: BLUE CROSS/BLUE SHIELD | Source: Ambulatory Visit | Attending: Radiation Oncology | Admitting: Radiation Oncology

## 2016-08-18 DIAGNOSIS — C719 Malignant neoplasm of brain, unspecified: Secondary | ICD-10-CM | POA: Diagnosis not present

## 2016-08-18 NOTE — Telephone Encounter (Signed)
Luke to reschedule patient.  Spoke with Adela Lank, who states she spoke with patient's husband today and has been rescheduled for July 30th.

## 2016-08-19 ENCOUNTER — Ambulatory Visit: Admission: RE | Admit: 2016-08-19 | Payer: BLUE CROSS/BLUE SHIELD | Source: Ambulatory Visit

## 2016-08-19 ENCOUNTER — Ambulatory Visit: Payer: BLUE CROSS/BLUE SHIELD

## 2016-08-20 ENCOUNTER — Ambulatory Visit
Admission: RE | Admit: 2016-08-20 | Discharge: 2016-08-20 | Disposition: A | Discharge status: Home / Self Care | Payer: BLUE CROSS/BLUE SHIELD | Source: Ambulatory Visit | Attending: Radiation Oncology | Admitting: Radiation Oncology

## 2016-08-20 DIAGNOSIS — C719 Malignant neoplasm of brain, unspecified: Secondary | ICD-10-CM | POA: Diagnosis not present

## 2016-08-21 ENCOUNTER — Ambulatory Visit
Admission: RE | Admit: 2016-08-21 | Discharge: 2016-08-21 | Disposition: A | Discharge status: Home / Self Care | Payer: BLUE CROSS/BLUE SHIELD | Source: Ambulatory Visit | Attending: Radiation Oncology | Admitting: Radiation Oncology

## 2016-08-21 DIAGNOSIS — C719 Malignant neoplasm of brain, unspecified: Secondary | ICD-10-CM | POA: Diagnosis not present

## 2016-08-24 ENCOUNTER — Inpatient Hospital Stay (HOSPITAL_BASED_OUTPATIENT_CLINIC_OR_DEPARTMENT_OTHER): Payer: BLUE CROSS/BLUE SHIELD | Admitting: Hematology and Oncology

## 2016-08-24 ENCOUNTER — Inpatient Hospital Stay: Payer: BLUE CROSS/BLUE SHIELD

## 2016-08-24 ENCOUNTER — Ambulatory Visit: Payer: BLUE CROSS/BLUE SHIELD | Admitting: Hematology and Oncology

## 2016-08-24 ENCOUNTER — Encounter: Payer: Self-pay | Admitting: Neurosurgery

## 2016-08-24 ENCOUNTER — Inpatient Hospital Stay: Payer: BLUE CROSS/BLUE SHIELD | Admitting: Hematology and Oncology

## 2016-08-24 ENCOUNTER — Other Ambulatory Visit: Payer: BLUE CROSS/BLUE SHIELD

## 2016-08-24 ENCOUNTER — Encounter: Payer: Self-pay | Admitting: Hematology and Oncology

## 2016-08-24 ENCOUNTER — Ambulatory Visit
Admission: RE | Admit: 2016-08-24 | Discharge: 2016-08-24 | Disposition: A | Discharge status: Home / Self Care | Payer: BLUE CROSS/BLUE SHIELD | Source: Ambulatory Visit | Attending: Radiation Oncology | Admitting: Radiation Oncology

## 2016-08-24 VITALS — BP 122/77 | HR 111 | Temp 98.9°F | Resp 18 | Wt 165.4 lb

## 2016-08-24 DIAGNOSIS — L89891 Pressure ulcer of other site, stage 1: Secondary | ICD-10-CM | POA: Diagnosis not present

## 2016-08-24 DIAGNOSIS — Z792 Long term (current) use of antibiotics: Secondary | ICD-10-CM | POA: Diagnosis not present

## 2016-08-24 DIAGNOSIS — Z79899 Other long term (current) drug therapy: Secondary | ICD-10-CM | POA: Diagnosis not present

## 2016-08-24 DIAGNOSIS — Z880 Allergy status to penicillin: Secondary | ICD-10-CM | POA: Diagnosis not present

## 2016-08-24 DIAGNOSIS — H462 Nutritional optic neuropathy: Secondary | ICD-10-CM | POA: Diagnosis not present

## 2016-08-24 DIAGNOSIS — Z803 Family history of malignant neoplasm of breast: Secondary | ICD-10-CM | POA: Diagnosis not present

## 2016-08-24 DIAGNOSIS — J45909 Unspecified asthma, uncomplicated: Secondary | ICD-10-CM

## 2016-08-24 DIAGNOSIS — R5383 Other fatigue: Secondary | ICD-10-CM | POA: Diagnosis not present

## 2016-08-24 DIAGNOSIS — E7800 Pure hypercholesterolemia, unspecified: Secondary | ICD-10-CM | POA: Diagnosis not present

## 2016-08-24 DIAGNOSIS — C719 Malignant neoplasm of brain, unspecified: Secondary | ICD-10-CM

## 2016-08-24 DIAGNOSIS — L8941 Pressure ulcer of contiguous site of back, buttock and hip, stage 1: Secondary | ICD-10-CM

## 2016-08-24 DIAGNOSIS — Z8619 Personal history of other infectious and parasitic diseases: Secondary | ICD-10-CM

## 2016-08-24 DIAGNOSIS — H53462 Homonymous bilateral field defects, left side: Secondary | ICD-10-CM | POA: Diagnosis not present

## 2016-08-24 DIAGNOSIS — C718 Malignant neoplasm of overlapping sites of brain: Secondary | ICD-10-CM

## 2016-08-24 LAB — CBC WITH DIFFERENTIAL/PLATELET
Basophils Absolute: 0 10*3/uL (ref 0–0.1)
Basophils Relative: 0 %
Eosinophils Absolute: 0 10*3/uL (ref 0–0.7)
Eosinophils Relative: 0 %
HCT: 40.6 % (ref 35.0–47.0)
Hemoglobin: 13.9 g/dL (ref 12.0–16.0)
Lymphocytes Relative: 4 %
Lymphs Abs: 0.5 10*3/uL — ABNORMAL LOW (ref 1.0–3.6)
MCH: 31.5 pg (ref 26.0–34.0)
MCHC: 34.1 g/dL (ref 32.0–36.0)
MCV: 92.3 fL (ref 80.0–100.0)
Monocytes Absolute: 0.3 10*3/uL (ref 0.2–0.9)
Monocytes Relative: 3 %
Neutro Abs: 10.9 10*3/uL — ABNORMAL HIGH (ref 1.4–6.5)
Neutrophils Relative %: 93 %
Platelets: 343 10*3/uL (ref 150–440)
RBC: 4.4 MIL/uL (ref 3.80–5.20)
RDW: 17.4 % — ABNORMAL HIGH (ref 11.5–14.5)
WBC: 11.8 10*3/uL — ABNORMAL HIGH (ref 3.6–11.0)

## 2016-08-24 LAB — COMPREHENSIVE METABOLIC PANEL
ALT: 65 U/L — ABNORMAL HIGH (ref 14–54)
AST: 31 U/L (ref 15–41)
Albumin: 3.8 g/dL (ref 3.5–5.0)
Alkaline Phosphatase: 81 U/L (ref 38–126)
Anion gap: 9 (ref 5–15)
BUN: 10 mg/dL (ref 6–20)
CO2: 23 mmol/L (ref 22–32)
Calcium: 8.9 mg/dL (ref 8.9–10.3)
Chloride: 100 mmol/L — ABNORMAL LOW (ref 101–111)
Creatinine, Ser: 0.83 mg/dL (ref 0.44–1.00)
GFR calc Af Amer: >60 mL/min (ref >60)
GFR calc non Af Amer: >60 mL/min (ref >60)
Glucose, Bld: 243 mg/dL — ABNORMAL HIGH (ref 65–99)
Potassium: 4.4 mmol/L (ref 3.5–5.1)
Sodium: 132 mmol/L — ABNORMAL LOW (ref 135–145)
Total Bilirubin: 1.1 mg/dL (ref 0.3–1.2)
Total Protein: 6.8 g/dL (ref 6.5–8.1)

## 2016-08-24 NOTE — Progress Notes (Signed)
Patient had to take her nausea medicine one night this past week.  Otherwise, offers no complaints.

## 2016-08-24 NOTE — Progress Notes (Signed)
[facility] day:  08/24/2016   Chief Complaint: Jessica [name] is a 61 y.o. female with a glioblastoma who is seen for assessment on week #3 concurrent Temodar and radiation.  HPI:  The patient was last seen in the medical oncology clinic on 08/17/2016.  At that time, she was feeling a little better at home.  She was taking her Temodar.  She was on Decadron 4 mg a day.  She continued radiation.  Wound care consult was re-requested to manage her decubitus ulcer.  During the interim, she notes "things are good at home".  She states that it has "been awhile since stuck on the floor".  She has missed no further doses of Temodar.  She continues radiation.  She missed 1 day of radiation as there was "trouble with the machine".  She remains on Decadron 4 mg a day.  She is taking Bactrim every Monday, Wednesday, and Friday.   Blood sugar is up after ice cream.  She goes to wound care on 08/31/2016.   Past Medical History:  Diagnosis Date  . Asthma   . Elevated cholesterol   . Glioblastoma ([place])   . History of chicken pox   . History of measles   . History of mumps     Past Surgical History:  Procedure Laterality Date  . CT Scan of head  2009   negative  . FOOT SURGERY    . LAPAROSCOPY     abdominal endometriosis  . TUBAL LIGATION  1984    Family History  Problem Relation Age of Onset  . Breast cancer Mother 17  . Depression Mother   . Hyperlipidemia Mother   . Hypothyroidism Mother   . Hypertension Mother   . Diabetes Father   . Hypertension Father   . Hypertension Brother   . Hypertension Sister     Social History:  reports that she has never smoked. She has never used smokeless tobacco. She reports that she drinks alcohol. She reports that she does not use drugs.  She has 2 children (age 37 and 40).  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  She lives in [place].  The patient is accompanied by her husband, Yvone Neu,  today.  Allergies:  Allergies  Allergen Reactions  . Augmentin [Amoxicillin-Pot Clavulanate] Nausea And Vomiting  . Compazine  [Prochlorperazine]     swelling, SOB.  Marland Kitchen Oxycodone Nausea Only    Current Medications: Current Outpatient Prescriptions  Medication Sig Dispense Refill  . acetaminophen (TYLENOL) 325 MG tablet Take 975 mg by mouth every 6 (six) hours as needed.    . ALPRAZolam (XANAX) 1 MG tablet Take 1 tablet (1 mg total) by mouth daily as needed for anxiety. 30 tablet 0  . atorvastatin (LIPITOR) 40 MG tablet TAKE 1 TABLET DAILY 90 tablet 4  . bacitracin 500 UNIT/GM ointment Apply 1 application topically daily.    Marland Kitchen dexamethasone (DECADRON) 4 MG tablet Take 1 tablet (4 mg total) by mouth daily. 50 tablet 0  . fluticasone (FLONASE) 50 MCG/ACT nasal spray SHAKE LIQUID AND USE 2 SPRAYS IN EACH NOSTRIL DAILY 16 g 5  . levETIRAcetam (KEPPRA) 1000 MG tablet Take 1,000 mg by mouth See admin instructions. tk 1079m bid x 10 days, if no seizures then decrease to 5042mbid for 10 days, then if no seizures may stop    . mometasone (NASONEX) 50 MCG/ACT nasal spray Place 2 sprays into the nose  daily.    . montelukast (SINGULAIR) 10 MG tablet Take 10 mg by mouth at bedtime.    Marland Kitchen omeprazole (PRILOSEC) 20 MG capsule Take 1 capsule (20 mg total) by mouth daily. 90 capsule 3  . ondansetron (ZOFRAN-ODT) 8 MG disintegrating tablet Take 1 tablet (8 mg total) by mouth every 8 (eight) hours as needed for nausea or vomiting. 30 tablet 1  . oxyCODONE (OXY IR/ROXICODONE) 5 MG immediate release tablet Take 5 mg by mouth every 6 (six) hours as needed.    . polyethylene glycol (MIRALAX / GLYCOLAX) packet Take 17 g by mouth daily as needed.    . potassium chloride SA (K-DUR,KLOR-CON) 20 MEQ tablet Take 1 tablet (20 mEq total) by mouth daily. 7 tablet 0  . sennosides-docusate sodium (SENOKOT-S) 8.6-50 MG tablet Take 2 tablets by mouth 2 (two) times daily.    . sertraline (ZOLOFT) 25 MG tablet Take by mouth.     . solifenacin (VESICARE) 10 MG tablet Take by mouth daily.    . traZODone (DESYREL) 50 MG tablet Take 50-100 mg by mouth at bedtime.    Marland Kitchen buPROPion (WELLBUTRIN XL) 300 MG 24 hr tablet Take 1 tablet (300 mg total) by mouth daily. (Patient not taking: Reported on 07/27/2016) 90 tablet 3  . busPIRone (BUSPAR) 5 MG tablet Take 1 tablet (5 mg total) by mouth 3 (three) times daily. (Patient not taking: Reported on 07/07/2016) 270 tablet 3  . HYDROcodone-homatropine (HYCODAN) 5-1.5 MG/5ML syrup 5 ml 4-6 hours as needed for cough (Patient not taking: Reported on 07/07/2016) 240 mL 0  . sulfamethoxazole-trimethoprim (BACTRIM DS,SEPTRA DS) 800-160 MG tablet Take 1 tablet by mouth 3 (three) times a week. (Patient not taking: Reported on 08/10/2016) 30 tablet 0  . temozolomide (TEMODAR) 100 MG capsule May take on an empty stomach or at bedtime to decrease nausea & vomiting. (Patient not taking: Reported on 07/27/2016) 42 capsule 0  . temozolomide (TEMODAR) 20 MG capsule Take 2 capsules (40 mg total) by mouth daily. May take on an empty stomach or at bedtime to decrease nausea & vomiting. (Patient not taking: Reported on 07/27/2016) 84 capsule 0  . zolpidem (AMBIEN) 5 MG tablet TAKE 1 TABLET BY MOUTH EVERY NIGHT AT BEDTIME AS NEEDED FOR INSOMNIA (Patient not taking: Reported on 07/07/2016) 30 tablet 5   No current facility-administered medications for this visit.     Review of Systems:  GENERAL:  Feeling better.  No fevers or sweats.  Weight loss prior to diagnosis.  Previously weighed 170-180 pounds. PERFORMANCE STATUS (ECOG):  2-3 HEENT:  Left sided vision loss (no change).  No runny nose, sore throat, mouth sores or tenderness. Lungs: No shortness of breath or cough.  No hemoptysis. Cardiac:  No chest pain, palpitations, orthopnea, or PND. GI:  No nausea, vomiting, diarrhea, constipation, melena or hematochezia.  Pain pills cause emesis. GU:  No urgency, frequency, dysuria, or hematuria. Musculoskeletal:  No back  pain.  No joint pain.  No muscle tenderness. Extremities:  Legs weak.  No pain or swelling. Skin:  No rashes or skin changes. Neuro:  Left homonymous hemianopsia.  No headache, focal numbness, balance or coordination issues. Endocrine:  No diabetes, thyroid issues, hot flashes or night sweats. Psych:  No mood changes, depression or anxiety. Pain:  No focal pain. Review of systems:  All other systems reviewed and found to be negative.  Physical Exam: Blood pressure 122/77, pulse (!) 111, temperature 98.9 F (37.2 C), temperature source Tympanic, resp. rate 18, weight  165 lb 6 oz (75 kg). GENERAL:  Well developed, well nourished, woman sitting comfortably in a wheelchair in the exam room in no acute distress.  She requires a 2 person assist onto the exam table. MENTAL STATUS:  Alert and oriented to person, place and time. HEAD:  Shoulder length light brown hair.  Normocephalic, atraumatic, face symmetric, no Cushingoid features. EYES:  Glasses.  Blue eyes.  Pupils equal round and reactive to light and accomodation.  No conjunctivitis or scleral icterus. ENT:  No thrush.  Tongue normal. Mucous membranes moist.  RESPIRATORY:  Clear to auscultation without rales, wheezes or rhonchi. CARDIOVASCULAR:  Regular rate and rhythm without murmur, rub or gallop. ABDOMEN:  Soft, non-tender, with active bowel sounds, and no hepatosplenomegaly.  No masses. SKIN:  Grade I bilateral gluteal fold decubitus ulcer (progressive with small open areas- 5).  Right forearm bruise, faded.  EXTREMITIES: No edema, no skin discoloration or tenderness.  No palpable cords. LYMPH NODES: No palpable cervical, supraclavicular, axillary or inguinal adenopathy  NEUROLOGICAL: Stable.  No improvement in visual symptoms (left homonymous hemianopsia). PSYCH:  Appropriate.   Appointment on 08/24/2016  Component Date Value Ref Range Status  . WBC 08/24/2016 11.8* 3.6 - 11.0 K/uL Final  . RBC 08/24/2016 4.40  3.80 - 5.20 MIL/uL  Final  . Hemoglobin 08/24/2016 13.9  12.0 - 16.0 g/dL Final  . HCT 08/24/2016 40.6  35.0 - 47.0 % Final  . MCV 08/24/2016 92.3  80.0 - 100.0 fL Final  . MCH 08/24/2016 31.5  26.0 - 34.0 pg Final  . MCHC 08/24/2016 34.1  32.0 - 36.0 g/dL Final  . RDW 08/24/2016 17.4* 11.5 - 14.5 % Final  . Platelets 08/24/2016 343  150 - 440 K/uL Final  . Neutrophils Relative % 08/24/2016 93  % Final  . Neutro Abs 08/24/2016 10.9* 1.4 - 6.5 K/uL Final  . Lymphocytes Relative 08/24/2016 4  % Final  . Lymphs Abs 08/24/2016 0.5* 1.0 - 3.6 K/uL Final  . Monocytes Relative 08/24/2016 3  % Final  . Monocytes Absolute 08/24/2016 0.3  0.2 - 0.9 K/uL Final  . Eosinophils Relative 08/24/2016 0  % Final  . Eosinophils Absolute 08/24/2016 0.0  0 - 0.7 K/uL Final  . Basophils Relative 08/24/2016 0  % Final  . Basophils Absolute 08/24/2016 0.0  0 - 0.1 K/uL Final  . Sodium 08/24/2016 132* 135 - 145 mmol/L Final  . Potassium 08/24/2016 4.4  3.5 - 5.1 mmol/L Final  . Chloride 08/24/2016 100* 101 - 111 mmol/L Final  . CO2 08/24/2016 23  22 - 32 mmol/L Final  . Glucose, Bld 08/24/2016 243* 65 - 99 mg/dL Final  . BUN 08/24/2016 10  6 - 20 mg/dL Final  . Creatinine, Ser 08/24/2016 0.83  0.44 - 1.00 mg/dL Final  . Calcium 08/24/2016 8.9  8.9 - 10.3 mg/dL Final  . Total Protein 08/24/2016 6.8  6.5 - 8.1 g/dL Final  . Albumin 08/24/2016 3.8  3.5 - 5.0 g/dL Final  . AST 08/24/2016 31  15 - 41 U/L Final  . ALT 08/24/2016 65* 14 - 54 U/L Final  . Alkaline Phosphatase 08/24/2016 81  38 - 126 U/L Final  . Total Bilirubin 08/24/2016 1.1  0.3 - 1.2 mg/dL Final  . GFR calc non Af Amer 08/24/2016 >60  >60 mL/min Final  . GFR calc Af Amer 08/24/2016 >60  >60 mL/min Final   Comment: (NOTE) The eGFR has been calculated using the CKD EPI equation. This calculation has  not been validated in all clinical situations. eGFR's persistently <60 mL/min signify possible Chronic Kidney Disease.   . Anion gap 08/24/2016 9  5 - 15 Final     Assessment:  Jessica [name] is a 61 y.o. 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    Head MRI at [facility] on 07/01/2016 revealed a 5.7 x 4.5 cm heterogeneously enhancing T2 hyperintense lesion in the right is posterior medial temporal lobe with extension into the adjacent right parietal and occipital lobes.  There was a separate enhancing component along the ependymal surface at the body of the right lateral ventricle extending into the centrum semiovale. There was marked mass effect on the atrium and occipital horn of the right lateral ventricle with slight increase in dilation of the temporal horn of the right lateral ventricle, consistent with at least partial entrapment. There was stable dilation of the left lateral ventricle.  There was no hemorrhage or large acute cortical infarction.   Head MRI at [facility] on 07/02/2016 revealed substantial resection of the mass in the right cerebral hemisphere with residual disease disease. There was T2/FLAIR signal hyperintensity along the margins of the resection cavity. There was focal masslike T2/FLAIR signal hyperintensity and enhancement in the right frontal periventricular white matter, and within the splenium of the corpus callosum compatible with residual disease. There was enhancement along the margins of the resection cavity, which appeared nodular at the posterolateral aspect.  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  She began cranial radiation on 08/06/2016.  She is currently week #3 Temodar (began on 08/10/2016).  She missed 1 dose.  She is on Bactrim prophylaxis.  She is on Decadron 4 mg a day.  Symptomatically, she feels better.  She has stable visual loss (left homonymous hemianopsia) secondary to right occipital lobe tumor and infarct.  She has a gluteal fold decubitus ulcer (improved).  She needs assistance with ADLs.  Plan: 1.  Labs today:  CBC with diff, CMP. 2.  Continue  Temodar 140 mg daily. 3.  Continue Decadron 4 mg a day. 4.  Continue radiation every Monday - Friday. 5.  Continue Septra DS 1 tablet every Monday, Wednesday, Friday for PCP prophylaxis. 6.  Wound care consult on 08/31/2016. 7.  RTC in 1 week for MD assessment and labs (CBC with diff, CMP).   Lequita Asal, MD  08/10/2016.td, 2:26 PM

## 2016-08-25 ENCOUNTER — Ambulatory Visit: Payer: BLUE CROSS/BLUE SHIELD | Admitting: Physical Therapy

## 2016-08-25 ENCOUNTER — Ambulatory Visit
Admission: RE | Admit: 2016-08-25 | Discharge: 2016-08-25 | Disposition: A | Discharge status: Home / Self Care | Payer: BLUE CROSS/BLUE SHIELD | Source: Ambulatory Visit | Attending: Radiation Oncology | Admitting: Radiation Oncology

## 2016-08-25 DIAGNOSIS — C719 Malignant neoplasm of brain, unspecified: Secondary | ICD-10-CM | POA: Diagnosis not present

## 2016-08-26 ENCOUNTER — Ambulatory Visit: Payer: BLUE CROSS/BLUE SHIELD

## 2016-08-26 ENCOUNTER — Ambulatory Visit: Admission: RE | Admit: 2016-08-26 | Payer: BLUE CROSS/BLUE SHIELD | Source: Ambulatory Visit

## 2016-08-27 ENCOUNTER — Ambulatory Visit
Admission: RE | Admit: 2016-08-27 | Discharge: 2016-08-27 | Disposition: A | Discharge status: Home / Self Care | Payer: BLUE CROSS/BLUE SHIELD | Source: Ambulatory Visit | Attending: Radiation Oncology | Admitting: Radiation Oncology

## 2016-08-27 DIAGNOSIS — C719 Malignant neoplasm of brain, unspecified: Secondary | ICD-10-CM | POA: Diagnosis not present

## 2016-08-28 ENCOUNTER — Ambulatory Visit
Admission: RE | Admit: 2016-08-28 | Discharge: 2016-08-28 | Disposition: A | Discharge status: Home / Self Care | Payer: BLUE CROSS/BLUE SHIELD | Source: Ambulatory Visit | Attending: Radiation Oncology | Admitting: Radiation Oncology

## 2016-08-28 DIAGNOSIS — C719 Malignant neoplasm of brain, unspecified: Secondary | ICD-10-CM | POA: Diagnosis not present

## 2016-08-30 DIAGNOSIS — H53462 Homonymous bilateral field defects, left side: Secondary | ICD-10-CM | POA: Insufficient documentation

## 2016-08-30 NOTE — Progress Notes (Signed)
[facility] day:  08/31/2016   Chief Complaint: Jessica [name] is a 61 y.o. female with a glioblastoma who is seen for assessment on week #4 concurrent Temodar and radiation.  HPI:  The patient was last seen in the medical oncology clinic on 08/24/2016.  At that time, she was doing a little better.  She was walking a little more.  She still had left visual loss.  She was taking her Temodar.  Radiation continued.  She was on Decadron 4 mg 1 day.    During the interim, she feels things are "going good". She is getting around more. She is eating more. She is making herself sandwiches. She needs some help in the shower.  Left sided vision has not returned.  She can dress well without assistance except for putting on her socks.   She attended the wound care clinic today.  She was told to stay off her bottom. Cream and pads were provided. She is scheduled to follow-up in one week.   Past Medical History:  Diagnosis Date  . Asthma   . Elevated cholesterol   . Glioblastoma ([place])   . History of chicken pox   . History of measles   . History of mumps     Past Surgical History:  Procedure Laterality Date  . CT Scan of head  2009   negative  . FOOT SURGERY    . LAPAROSCOPY     abdominal endometriosis  . TUBAL LIGATION  1984    Family History  Problem Relation Age of Onset  . Breast cancer Mother 76  . Depression Mother   . Hyperlipidemia Mother   . Hypothyroidism Mother   . Hypertension Mother   . Diabetes Father   . Hypertension Father   . Hypertension Brother   . Hypertension Sister     Social History:  reports that she has never smoked. She has never used smokeless tobacco. She reports that she drinks alcohol. She reports that she does not use drugs.  She has 2 children (age 57 and 23).  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  The patient is accompanied by her husband, Yvone Neu,  today.  Allergies:  Allergies  Allergen Reactions  . Augmentin [Amoxicillin-Pot Clavulanate] Nausea And Vomiting  . Compazine  [Prochlorperazine]     swelling, SOB.  Marland Kitchen Oxycodone Nausea Only    Current Medications: Current Outpatient Prescriptions  Medication Sig Dispense Refill  . acetaminophen (TYLENOL) 325 MG tablet Take 975 mg by mouth every 6 (six) hours as needed.    . ALPRAZolam (XANAX) 1 MG tablet Take 1 tablet (1 mg total) by mouth daily as needed for anxiety. 30 tablet 0  . atorvastatin (LIPITOR) 40 MG tablet TAKE 1 TABLET DAILY 90 tablet 4  . bacitracin 500 UNIT/GM ointment Apply 1 application topically daily.    . busPIRone (BUSPAR) 5 MG tablet Take 1 tablet (5 mg total) by mouth 3 (three) times daily. 270 tablet 3  . dexamethasone (DECADRON) 4 MG tablet Take 1 tablet (4 mg total) by mouth daily. 50 tablet 0  . fluticasone (FLONASE) 50 MCG/ACT nasal spray SHAKE LIQUID AND USE 2 SPRAYS IN EACH NOSTRIL DAILY 16 g 5  . levETIRAcetam (KEPPRA) 1000 MG tablet Take 1,000 mg by mouth See admin instructions. tk 1060m bid x 10 days, if no seizures then decrease to 5064mbid for 10 days, then if no  seizures may stop    . mometasone (NASONEX) 50 MCG/ACT nasal spray Place 2 sprays into the nose daily.    . montelukast (SINGULAIR) 10 MG tablet Take 10 mg by mouth at bedtime.    Marland Kitchen omeprazole (PRILOSEC) 20 MG capsule Take 1 capsule (20 mg total) by mouth daily. 90 capsule 3  . ondansetron (ZOFRAN-ODT) 8 MG disintegrating tablet Take 1 tablet (8 mg total) by mouth every 8 (eight) hours as needed for nausea or vomiting. 30 tablet 1  . oxyCODONE (OXY IR/ROXICODONE) 5 MG immediate release tablet Take 5 mg by mouth every 6 (six) hours as needed.    . polyethylene glycol (MIRALAX / GLYCOLAX) packet Take 17 g by mouth daily as needed.    . potassium chloride SA (K-DUR,KLOR-CON) 20 MEQ tablet Take 1 tablet (20 mEq total) by mouth daily. 7 tablet 0  . sennosides-docusate sodium (SENOKOT-S) 8.6-50 MG  tablet Take 2 tablets by mouth 2 (two) times daily.    . sertraline (ZOLOFT) 25 MG tablet Take by mouth.    . solifenacin (VESICARE) 10 MG tablet Take by mouth daily.    . traZODone (DESYREL) 50 MG tablet Take 50-100 mg by mouth at bedtime.    Marland Kitchen buPROPion (WELLBUTRIN XL) 300 MG 24 hr tablet Take 1 tablet (300 mg total) by mouth daily. (Patient not taking: Reported on 07/27/2016) 90 tablet 3  . HYDROcodone-homatropine (HYCODAN) 5-1.5 MG/5ML syrup 5 ml 4-6 hours as needed for cough (Patient not taking: Reported on 07/07/2016) 240 mL 0  . sulfamethoxazole-trimethoprim (BACTRIM DS,SEPTRA DS) 800-160 MG tablet Take 1 tablet by mouth 3 (three) times a week. (Patient not taking: Reported on 08/10/2016) 30 tablet 0  . temozolomide (TEMODAR) 100 MG capsule May take on an empty stomach or at bedtime to decrease nausea & vomiting. (Patient not taking: Reported on 07/27/2016) 42 capsule 0  . temozolomide (TEMODAR) 20 MG capsule Take 2 capsules (40 mg total) by mouth daily. May take on an empty stomach or at bedtime to decrease nausea & vomiting. (Patient not taking: Reported on 07/27/2016) 84 capsule 0  . zolpidem (AMBIEN) 5 MG tablet TAKE 1 TABLET BY MOUTH EVERY NIGHT AT BEDTIME AS NEEDED FOR INSOMNIA (Patient not taking: Reported on 07/07/2016) 30 tablet 5   No current facility-administered medications for this visit.     Review of Systems:  GENERAL:  Feeling better.  No fevers or sweats.  Weight stable.  Previously weighed 170-180 pounds. PERFORMANCE STATUS (ECOG):  2-3 HEENT:  Left sided vision loss (no change).  No runny nose, sore throat, mouth sores or tenderness. Lungs: No shortness of breath or cough.  No hemoptysis. Cardiac:  No chest pain, palpitations, orthopnea, or PND. GI:  No nausea, vomiting, diarrhea, constipation, melena or hematochezia.  Pain pills cause emesis. GU:  No urgency, frequency, dysuria, or hematuria. Musculoskeletal:  No back pain.  No joint pain.  No muscle tenderness. Extremities:   Legs weak.  No pain or swelling. Skin:  Decubitus ulcer (see HPI).  No other rashes or skin changes. Neuro:  Left homonymous hemianopsia.  No headache, focal numbness, balance or coordination issues. Endocrine:  No diabetes, thyroid issues, hot flashes or night sweats. Psych:  No mood changes, depression or anxiety. Pain:  No focal pain. Review of systems:  All other systems reviewed and found to be negative.  Physical Exam: Blood pressure 122/86, pulse (!) 101, temperature 97.7 F (36.5 C), temperature source Tympanic, resp. rate 20, weight 165 lb 9 oz (75.1 kg).  GENERAL:  Well developed, well nourished, woman sitting comfortably in the exam room in no acute distress.  She requires a 2 person assist onto the exam table.   MENTAL STATUS:  Alert and oriented to person, place and time. HEAD:  Wearing a hat.  Shoulder length light brown hair.  Normocephalic, atraumatic, face full and symmetric, no Cushingoid features. EYES:  Glasses.  Blue eyes.  Pupils equal round and reactive to light and accomodation.  No conjunctivitis or scleral icterus. ENT:  No thrush.  Tongue normal. Mucous membranes moist.  RESPIRATORY:  Clear to auscultation without rales, wheezes or rhonchi. CARDIOVASCULAR:  Regular rate and rhythm without murmur, rub or gallop. ABDOMEN:  Soft, non-tender, with active bowel sounds, and no hepatosplenomegaly.  No masses. SKIN:  Bilateral gluteal fold decubitus ulcer not visualized (dressed after wound care clinic appointment).  Right forearm bruise, faded.  EXTREMITIES: No edema, no skin discoloration or tenderness.  No palpable cords. LYMPH NODES: No palpable cervical, supraclavicular, axillary or inguinal adenopathy  NEUROLOGICAL: Stable.  No improvement in visual symptoms (left homonymous hemianopsia). PSYCH:  Appropriate.   Appointment on 08/31/2016  Component Date Value Ref Range Status  . WBC 08/31/2016 10.1  3.6 - 11.0 K/uL Final  . RBC 08/31/2016 4.42  3.80 - 5.20 MIL/uL  Final  . Hemoglobin 08/31/2016 14.3  12.0 - 16.0 g/dL Final  . HCT 08/31/2016 40.9  35.0 - 47.0 % Final  . MCV 08/31/2016 92.7  80.0 - 100.0 fL Final  . MCH 08/31/2016 32.3  26.0 - 34.0 pg Final  . MCHC 08/31/2016 34.9  32.0 - 36.0 g/dL Final  . RDW 08/31/2016 18.0* 11.5 - 14.5 % Final  . Platelets 08/31/2016 294  150 - 440 K/uL Final  . Neutrophils Relative % 08/31/2016 92  % Final  . Neutro Abs 08/31/2016 9.3* 1.4 - 6.5 K/uL Final  . Lymphocytes Relative 08/31/2016 5  % Final  . Lymphs Abs 08/31/2016 0.5* 1.0 - 3.6 K/uL Final  . Monocytes Relative 08/31/2016 3  % Final  . Monocytes Absolute 08/31/2016 0.3  0.2 - 0.9 K/uL Final  . Eosinophils Relative 08/31/2016 0  % Final  . Eosinophils Absolute 08/31/2016 0.0  0 - 0.7 K/uL Final  . Basophils Relative 08/31/2016 0  % Final  . Basophils Absolute 08/31/2016 0.0  0 - 0.1 K/uL Final  . Sodium 08/31/2016 135  135 - 145 mmol/L Final  . Potassium 08/31/2016 4.4  3.5 - 5.1 mmol/L Final  . Chloride 08/31/2016 100* 101 - 111 mmol/L Final  . CO2 08/31/2016 25  22 - 32 mmol/L Final  . Glucose, Bld 08/31/2016 147* 65 - 99 mg/dL Final  . BUN 08/31/2016 11  6 - 20 mg/dL Final  . Creatinine, Ser 08/31/2016 0.99  0.44 - 1.00 mg/dL Final  . Calcium 08/31/2016 9.1  8.9 - 10.3 mg/dL Final  . Total Protein 08/31/2016 6.6  6.5 - 8.1 g/dL Final  . Albumin 08/31/2016 3.9  3.5 - 5.0 g/dL Final  . AST 08/31/2016 20  15 - 41 U/L Final  . ALT 08/31/2016 41  14 - 54 U/L Final  . Alkaline Phosphatase 08/31/2016 67  38 - 126 U/L Final  . Total Bilirubin 08/31/2016 1.1  0.3 - 1.2 mg/dL Final  . GFR calc non Af Amer 08/31/2016 60* >60 mL/min Final  . GFR calc Af Amer 08/31/2016 >60  >60 mL/min Final   Comment: (NOTE) The eGFR has been calculated using the CKD EPI equation. This calculation  has not been validated in all clinical situations. eGFR's persistently <60 mL/min signify possible Chronic Kidney Disease.   . Anion gap 08/31/2016 10  5 - 15 Final     Assessment:  Jessica [name] is a 61 y.o. 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    Head MRI at [facility] on 07/01/2016 revealed a 5.7 x 4.5 cm heterogeneously enhancing T2 hyperintense lesion in the right is posterior medial temporal lobe with extension into the adjacent right parietal and occipital lobes.  There was a separate enhancing component along the ependymal surface at the body of the right lateral ventricle extending into the centrum semiovale. There was marked mass effect on the atrium and occipital horn of the right lateral ventricle with slight increase in dilation of the temporal horn of the right lateral ventricle, consistent with at least partial entrapment. There was stable dilation of the left lateral ventricle.  There was no hemorrhage or large acute cortical infarction.   Head MRI at [facility] on 07/02/2016 revealed substantial resection of the mass in the right cerebral hemisphere with residual disease disease. There was T2/FLAIR signal hyperintensity along the margins of the resection cavity. There was focal masslike T2/FLAIR signal hyperintensity and enhancement in the right frontal periventricular white matter, and within the splenium of the corpus callosum compatible with residual disease. There was enhancement along the margins of the resection cavity, which appeared nodular at the posterolateral aspect.  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  She began cranial radiation on 08/06/2016.  She is currently week #4 Temodar (began on 08/10/2016).  She missed 1 dose.  She is on Bactrim prophylaxis.  She is on Decadron 4 mg a day.  Symptomatically, she continues to feel stronger.  She has stable visual loss (left homonymous hemianopsia) secondary to right occipital lobe tumor and infarct.  She has a gluteal fold decubitus ulcer.  She is attending the wound care clinic.  She needs assistance with some of her  ADLs.  Plan: 1.  Labs today:  CBC with diff, CMP. 2.  Continue Temodar 140 mg daily. 3.  Continue Decadron 4 mg a day. 4.  Continue radiation every Monday - Friday. 5.  Continue Septra DS 1 tablet every Monday, Wednesday, Friday for PCP prophylaxis. 6.  RTC in 1 week for MD assessment and labs (CBC with diff, CMP).   Lequita Asal, MD  08/10/2016.td, 2:33 PM

## 2016-08-31 ENCOUNTER — Inpatient Hospital Stay: Payer: BLUE CROSS/BLUE SHIELD

## 2016-08-31 ENCOUNTER — Encounter: Payer: Self-pay | Admitting: Hematology and Oncology

## 2016-08-31 ENCOUNTER — Other Ambulatory Visit: Payer: BLUE CROSS/BLUE SHIELD

## 2016-08-31 ENCOUNTER — Encounter: Payer: BLUE CROSS/BLUE SHIELD | Attending: Surgery | Admitting: Surgery

## 2016-08-31 ENCOUNTER — Ambulatory Visit
Admission: RE | Admit: 2016-08-31 | Discharge: 2016-08-31 | Disposition: A | Discharge status: Home / Self Care | Payer: BLUE CROSS/BLUE SHIELD | Source: Ambulatory Visit | Attending: Radiation Oncology | Admitting: Radiation Oncology

## 2016-08-31 ENCOUNTER — Inpatient Hospital Stay: Payer: BLUE CROSS/BLUE SHIELD | Admitting: Hematology and Oncology

## 2016-08-31 ENCOUNTER — Ambulatory Visit: Payer: BLUE CROSS/BLUE SHIELD | Admitting: Hematology and Oncology

## 2016-08-31 ENCOUNTER — Ambulatory Visit: Payer: BLUE CROSS/BLUE SHIELD | Admitting: Physical Therapy

## 2016-08-31 ENCOUNTER — Inpatient Hospital Stay (HOSPITAL_BASED_OUTPATIENT_CLINIC_OR_DEPARTMENT_OTHER): Payer: BLUE CROSS/BLUE SHIELD | Admitting: Hematology and Oncology

## 2016-08-31 VITALS — BP 122/86 | HR 101 | Temp 97.7°F | Resp 20 | Wt 165.6 lb

## 2016-08-31 DIAGNOSIS — Z792 Long term (current) use of antibiotics: Secondary | ICD-10-CM

## 2016-08-31 DIAGNOSIS — Z79899 Other long term (current) drug therapy: Secondary | ICD-10-CM | POA: Diagnosis not present

## 2016-08-31 DIAGNOSIS — F329 Major depressive disorder, single episode, unspecified: Secondary | ICD-10-CM | POA: Diagnosis not present

## 2016-08-31 DIAGNOSIS — Z9221 Personal history of antineoplastic chemotherapy: Secondary | ICD-10-CM | POA: Insufficient documentation

## 2016-08-31 DIAGNOSIS — J45909 Unspecified asthma, uncomplicated: Secondary | ICD-10-CM | POA: Diagnosis not present

## 2016-08-31 DIAGNOSIS — L89311 Pressure ulcer of right buttock, stage 1: Secondary | ICD-10-CM | POA: Diagnosis not present

## 2016-08-31 DIAGNOSIS — C718 Malignant neoplasm of overlapping sites of brain: Secondary | ICD-10-CM | POA: Diagnosis not present

## 2016-08-31 DIAGNOSIS — L89321 Pressure ulcer of left buttock, stage 1: Secondary | ICD-10-CM | POA: Diagnosis present

## 2016-08-31 DIAGNOSIS — C719 Malignant neoplasm of brain, unspecified: Secondary | ICD-10-CM

## 2016-08-31 DIAGNOSIS — E7800 Pure hypercholesterolemia, unspecified: Secondary | ICD-10-CM | POA: Insufficient documentation

## 2016-08-31 DIAGNOSIS — Z923 Personal history of irradiation: Secondary | ICD-10-CM | POA: Insufficient documentation

## 2016-08-31 DIAGNOSIS — L89891 Pressure ulcer of other site, stage 1: Secondary | ICD-10-CM

## 2016-08-31 DIAGNOSIS — H53462 Homonymous bilateral field defects, left side: Secondary | ICD-10-CM | POA: Diagnosis not present

## 2016-08-31 DIAGNOSIS — Z85841 Personal history of malignant neoplasm of brain: Secondary | ICD-10-CM | POA: Insufficient documentation

## 2016-08-31 DIAGNOSIS — H5462 Unqualified visual loss, left eye, normal vision right eye: Secondary | ICD-10-CM

## 2016-08-31 DIAGNOSIS — Z803 Family history of malignant neoplasm of breast: Secondary | ICD-10-CM

## 2016-08-31 DIAGNOSIS — R54 Age-related physical debility: Secondary | ICD-10-CM | POA: Insufficient documentation

## 2016-08-31 DIAGNOSIS — F419 Anxiety disorder, unspecified: Secondary | ICD-10-CM | POA: Insufficient documentation

## 2016-08-31 DIAGNOSIS — R5383 Other fatigue: Secondary | ICD-10-CM

## 2016-08-31 DIAGNOSIS — Z880 Allergy status to penicillin: Secondary | ICD-10-CM | POA: Diagnosis not present

## 2016-08-31 DIAGNOSIS — Z8619 Personal history of other infectious and parasitic diseases: Secondary | ICD-10-CM | POA: Diagnosis not present

## 2016-08-31 LAB — CBC WITH DIFFERENTIAL/PLATELET
Basophils Absolute: 0 10*3/uL (ref 0–0.1)
Basophils Relative: 0 %
Eosinophils Absolute: 0 10*3/uL (ref 0–0.7)
Eosinophils Relative: 0 %
HCT: 40.9 % (ref 35.0–47.0)
Hemoglobin: 14.3 g/dL (ref 12.0–16.0)
Lymphocytes Relative: 5 %
Lymphs Abs: 0.5 10*3/uL — ABNORMAL LOW (ref 1.0–3.6)
MCH: 32.3 pg (ref 26.0–34.0)
MCHC: 34.9 g/dL (ref 32.0–36.0)
MCV: 92.7 fL (ref 80.0–100.0)
Monocytes Absolute: 0.3 10*3/uL (ref 0.2–0.9)
Monocytes Relative: 3 %
Neutro Abs: 9.3 10*3/uL — ABNORMAL HIGH (ref 1.4–6.5)
Neutrophils Relative %: 92 %
Platelets: 294 10*3/uL (ref 150–440)
RBC: 4.42 MIL/uL (ref 3.80–5.20)
RDW: 18 % — ABNORMAL HIGH (ref 11.5–14.5)
WBC: 10.1 10*3/uL (ref 3.6–11.0)

## 2016-08-31 LAB — COMPREHENSIVE METABOLIC PANEL
ALT: 41 U/L (ref 14–54)
AST: 20 U/L (ref 15–41)
Albumin: 3.9 g/dL (ref 3.5–5.0)
Alkaline Phosphatase: 67 U/L (ref 38–126)
Anion gap: 10 (ref 5–15)
BUN: 11 mg/dL (ref 6–20)
CO2: 25 mmol/L (ref 22–32)
Calcium: 9.1 mg/dL (ref 8.9–10.3)
Chloride: 100 mmol/L — ABNORMAL LOW (ref 101–111)
Creatinine, Ser: 0.99 mg/dL (ref 0.44–1.00)
GFR calc Af Amer: >60 mL/min (ref >60)
GFR calc non Af Amer: 60 mL/min — ABNORMAL LOW (ref >60)
Glucose, Bld: 147 mg/dL — ABNORMAL HIGH (ref 65–99)
Potassium: 4.4 mmol/L (ref 3.5–5.1)
Sodium: 135 mmol/L (ref 135–145)
Total Bilirubin: 1.1 mg/dL (ref 0.3–1.2)
Total Protein: 6.6 g/dL (ref 6.5–8.1)

## 2016-08-31 NOTE — Progress Notes (Signed)
Patient states she was at the wound center prior to her appointment here today.  She is having some pain related to her wound.  Otherwise, offers no complaints.

## 2016-09-01 ENCOUNTER — Encounter: Payer: Self-pay | Admitting: Physical Therapy

## 2016-09-01 ENCOUNTER — Ambulatory Visit
Admission: RE | Admit: 2016-09-01 | Discharge: 2016-09-01 | Disposition: A | Discharge status: Home / Self Care | Payer: BLUE CROSS/BLUE SHIELD | Source: Ambulatory Visit | Attending: Radiation Oncology | Admitting: Radiation Oncology

## 2016-09-01 ENCOUNTER — Ambulatory Visit: Payer: BLUE CROSS/BLUE SHIELD | Attending: Hematology and Oncology | Admitting: Physical Therapy

## 2016-09-01 DIAGNOSIS — M6281 Muscle weakness (generalized): Secondary | ICD-10-CM | POA: Insufficient documentation

## 2016-09-01 DIAGNOSIS — C719 Malignant neoplasm of brain, unspecified: Secondary | ICD-10-CM | POA: Diagnosis not present

## 2016-09-01 DIAGNOSIS — R2681 Unsteadiness on feet: Secondary | ICD-10-CM | POA: Insufficient documentation

## 2016-09-01 NOTE — Patient Instructions (Signed)
Knee Extension: Resisted (Sitting)   With band looped around right ankle and under other foot, straighten leg with ankle loop. Keep other leg bent to increase resistance. Repeat _10___ times per set. Do __2__ sets per session. Do _2___ sessions per day.  http://orth.exer.us/691   Copyright  VHI. All rights reserved.  ABDUCTION: Sitting - Exercise Ball: Resistance Band (Active)   Sit with feet flat. With band tied around both legs, Lift right leg slightly and, against resistance band, draw it out to side. Complete __2_ sets of __10_ repetitions. Perform _2__ sessions per day.  Copyright  VHI. All rights reserved.  FLEXION: Sitting - Resistance Band (Active)   Sit, both feet flat. Have band tied around both legs above knees, lift right knee toward ceiling.Repeat with other knee Complete _2__ sets of _10__ repetitions. Perform _2__ sessions per day.  http://gtsc.exer.us/21   Copyright  VHI. All rights reserved.  HIP / KNEE: Extension - Sit to Stand   Sitting, lean chest forward, raise hips up from surface. Straighten hips and knees. Weight bear equally on left and right sides. Backs of legs should not push off surface. __10_ reps per set, __2_ sets per day, _5__ days per week Use assistive device as needed.  Copyright  VHI. All rights reserved.  FLEXION: Sitting - Resistance Band (Active)   Sit with right foot flat. Have band tied around both feet, bend ankle, bringing toes toward head. Complete __2_ sets of __10_ repetitions. Perform _2__ sessions per day.  Copyright  VHI. All rights reserved.  Toe / Heel Raise (Sitting)   Sitting, raise heels, then rock back on heels and raise toes. Repeat _10___ times.  Copyright  VHI. All rights reserved.

## 2016-09-01 NOTE — Progress Notes (Signed)
Jessica [name], Jessica [name] ([id number]) Visit Report for 08/31/2016 Chief Complaint Document Details Patient Name: Jessica [name], Jessica [name]. Date of Service: 08/31/2016 12:45 PM Medical Record Number: [id number] Patient Account Number: [id number] Date of Birth/Sex: 1955-11-04 (61 y.o. Female) Treating RN: Baruch Gouty, RN, BSN, Velva Harman Primary Care Provider: Frazier Richards Other Clinician: Referring Provider: Nolon Stalls Treating Provider/Extender: Frann Rider in Treatment: 0 Information Obtained from: Patient Chief Complaint Patient is at the clinic for treatment of an open pressure ulcer to both gluteal areas for about 8 weeks Electronic Signature(s) Signed: 08/31/2016 1:37:54 PM By: Christin Fudge MD, FACS Entered By: Christin Fudge on 08/31/2016 13:37:54 Jessica [name], Jessica [name] ([id number]) -------------------------------------------------------------------------------- HPI Details Patient Name: Jessica [name] Date of Service: 08/31/2016 12:45 PM Medical Record Number: [id number] Patient Account Number: [id number] Date of Birth/Sex: [birth year] (61 y.o. Female) Treating RN: Baruch Gouty, RN, BSN, Velva Harman Primary Care Provider: Frazier Richards Other Clinician: Referring Provider: Nolon Stalls Treating Provider/Extender: Frann Rider in Treatment: 0 History of Present Illness Location: both gluteal areas Quality: Patient reports experiencing a dull pain to affected area(s). Severity: Patient states wound (s) are getting better. Duration: Patient has had the wound for < 8 weeks prior to presenting for treatment Timing: Pain in wound is Intermittent (comes and goes Context: The wound would happen gradually Associated Signs and Symptoms: Patient reports having difficulty standing for long periods. HPI Description: 61 year old female who recently had a diagnosis of a glioblastoma and is on radiation and chemotherapy after her surgery was done on 07/02/2016. She is also feeling generally weak  and remains on low-dose steroids. She is on Bactrim 3 times a week. She presents with superficial ulcerations in the gluteal area which have been there for about 8 weeks.past medical history significant for asthma, glioblastoma, foot surgery and laparoscopy for endometriosis. she is ambulating well but generally weak and has not been getting out much Electronic Signature(s) Signed: 08/31/2016 1:40:42 PM By: Christin Fudge MD, FACS Entered By: Christin Fudge on 08/31/2016 13:40:41 Jessica [name] ([id number]) -------------------------------------------------------------------------------- Physical Exam Details Patient Name: Jessica [name] Date of Service: 08/31/2016 12:45 PM Medical Record Number: [id number] Patient Account Number: [id number] Date of Birth/Sex: [birth year] (61 y.o. Female) Treating RN: Baruch Gouty, RN, BSN, Velva Harman Primary Care Provider: Frazier Richards Other Clinician: Referring Provider: Nolon Stalls Treating Provider/Extender: Frann Rider in Treatment: 0 Constitutional . Pulse regular. Respirations normal and unlabored. Afebrile. . Eyes Nonicteric. Reactive to light. Ears, Nose, Mouth, and Throat Lips, teeth, and gums WNL.Marland Kitchen Moist mucosa without lesions. Neck supple and nontender. No palpable supraclavicular or cervical adenopathy. Normal sized without goiter. Respiratory WNL. No retractions.. Cardiovascular Pedal Pulses WNL. No clubbing, cyanosis or edema. Gastrointestinal (GI) Abdomen without masses or tenderness.. No liver or spleen enlargement or tenderness.. Lymphatic No adneopathy. No adenopathy. No adenopathy. Musculoskeletal Adexa without tenderness or enlargement.. Digits and nails w/o clubbing, cyanosis, infection, petechiae, ischemia, or inflammatory conditions.. Integumentary (Hair, Skin) No suspicious lesions. No crepitus or fluctuance. No peri-wound warmth or erythema. No masses.Marland Kitchen Psychiatric Judgement and insight Intact.. No evidence of  depression, anxiety, or agitation.. Notes she has stage I gluteal pressure ulcers bilaterally with no evidence of surrounding cellulitis or inflammation. No sharp debridement was required today. Electronic Signature(s) Signed: 08/31/2016 1:41:15 PM By: Christin Fudge MD, FACS Entered By: Christin Fudge on 08/31/2016 13:41:15 Jessica [name] ([id number]) -------------------------------------------------------------------------------- Physician Orders Details Patient Name: Jessica [name] Date of Service: 08/31/2016 12:45 PM Medical Record Number: [id number] Patient Account Number: [id number] Date of Birth/Sex: [birth year] (61 y.o. Female) Treating RN:  Cornell Barman Primary Care Provider: Frazier Richards Other Clinician: Referring Provider: Nolon Stalls Treating Provider/Extender: Frann Rider in Treatment: 0 Verbal / Phone Orders: No Diagnosis Coding Wound Cleansing Wound #1 Left Gluteus o Cleanse wound with mild soap and water Skin Barriers/Peri-Wound Care Wound #1 Left Gluteus o Barrier cream o Moisturizing lotion Primary Wound Dressing Wound #1 Left Gluteus o Boardered Foam Dressing Dressing Change Frequency Wound #1 Left Gluteus o Three times weekly - as needed Follow-up Appointments Wound #1 Left Gluteus o Return Appointment in 1 week. Off-Loading Wound #1 Left Gluteus o Turn and reposition every 2 hours Additional Orders / Instructions Wound #1 Left Gluteus o Increase protein intake. o Other: - Vitamin A, Vitamin C and Zinc Electronic Signature(s) Signed: 08/31/2016 4:12:07 PM By: Christin Fudge MD, FACS Signed: 08/31/2016 5:02:55 PM By: Gretta Cool BSN, RN, CWS, Kim RN, BSN [place], [place] ([id number]) Entered By: Gretta Cool, BSN, RN, CWS, Kim on 08/31/2016 13:26:39 Jessica [name] ([id number]) -------------------------------------------------------------------------------- Problem List Details Patient Name: Jessica [name]. Date of Service:  08/31/2016 12:45 PM Medical Record Number: [id number] Patient Account Number: [id number] Date of Birth/Sex: [birth year] (61 y.o. Female) Treating RN: Baruch Gouty, RN, BSN, Velva Harman Primary Care Provider: Frazier Richards Other Clinician: Referring Provider: Nolon Stalls Treating Provider/Extender: Frann Rider in Treatment: 0 Active Problems ICD-10 Encounter Code Description Active Date Diagnosis L89.321 Pressure ulcer of left buttock, stage 1 08/31/2016 Yes L89.311 Pressure ulcer of right buttock, stage 1 08/31/2016 Yes R54 Age-related physical debility 08/31/2016 Yes Inactive Problems Resolved Problems Electronic Signature(s) Signed: 08/31/2016 1:37:23 PM By: Christin Fudge MD, FACS Entered By: Christin Fudge on 08/31/2016 13:37:23 Jessica [name], Jessica [name] ([id number]) -------------------------------------------------------------------------------- Progress Note Details Patient Name: Jessica [name]. Date of Service: 08/31/2016 12:45 PM Medical Record Number: [id number] Patient Account Number: [id number] Date of Birth/Sex: 1955-09-01 (61 y.o. Female) Treating RN: Afful, RN, BSN, Velva Harman Primary Care Provider: Frazier Richards Other Clinician: Referring Provider: Nolon Stalls Treating Provider/Extender: Frann Rider in Treatment: 0 Subjective Chief Complaint Information obtained from Patient Patient is at the clinic for treatment of an open pressure ulcer to both gluteal areas for about 8 weeks History of Present Illness (HPI) The following HPI elements were documented for the patient's wound: Location: both gluteal areas Quality: Patient reports experiencing a dull pain to affected area(s). Severity: Patient states wound (s) are getting better. Duration: Patient has had the wound for < 8 weeks prior to presenting for treatment Timing: Pain in wound is Intermittent (comes and goes Context: The wound would happen gradually Associated Signs and Symptoms: Patient reports having  difficulty standing for long periods. 61 year old female who recently had a diagnosis of a glioblastoma and is on radiation and chemotherapy after her surgery was done on 07/02/2016. She is also feeling generally weak and remains on low-dose steroids. She is on Bactrim 3 times a week. She presents with superficial ulcerations in the gluteal area which have been there for about 8 weeks.past medical history significant for asthma, glioblastoma, foot surgery and laparoscopy for endometriosis. she is ambulating well but generally weak and has not been getting out much Wound History Patient presents with 1 open wound that has been present for approximately 1 month. Patient has been treating wound in the following manner: abx cream. Laboratory tests have been performed in the last month. Patient reportedly has tested positive for an antibiotic resistant organism. Patient reportedly has not tested positive for osteomyelitis. Patient reportedly has not had testing performed to evaluate circulation in the legs. Patient History Information  obtained from Patient. Allergies Compazine, penicillin Family History Cancer - Mother, Diabetes - Father, Heart Disease - Mother, Hypertension - Father, Thyroid Problems - Jessica [name], Jessica [name] ([id number]) Mother, No family history of Kidney Disease, Lung Disease, Seizures, Stroke, Tuberculosis. Social History Never smoker, Marital Status - Married, Alcohol Use - Rarely, Drug Use - No History, Caffeine Use - Rarely. Medical History Eyes Denies history of Cataracts, Glaucoma, Optic Neuritis Ear/Nose/Mouth/Throat Patient has history of Chronic sinus problems/congestion Denies history of Middle ear problems Hematologic/Lymphatic Denies history of Anemia, Human Immunodeficiency Virus, Lymphedema, Sickle Cell Disease Respiratory Denies history of Aspiration, Asthma, Chronic Obstructive Pulmonary Disease (COPD), Pneumothorax, Sleep Apnea,  Tuberculosis Cardiovascular Denies history of Angina, Arrhythmia, Congestive Heart Failure, Coronary Artery Disease, Deep Vein Thrombosis, Hypertension, Hypotension, Myocardial Infarction, Peripheral Arterial Disease, Peripheral Venous Disease, Phlebitis, Vasculitis Gastrointestinal Denies history of Cirrhosis , Colitis, Crohn s, Hepatitis A, Hepatitis B, Hepatitis C Endocrine Denies history of Type I Diabetes, Type II Diabetes Genitourinary Denies history of End Stage Renal Disease Immunological Denies history of Lupus Erythematosus, Raynaud s, Scleroderma Integumentary (Skin) Denies history of History of Burn, History of pressure wounds Musculoskeletal Denies history of Gout, Rheumatoid Arthritis, Osteoarthritis, Osteomyelitis Neurologic Denies history of Dementia, Neuropathy, Quadriplegia, Paraplegia, Seizure Disorder Oncologic Patient has history of Received Chemotherapy - Current n Chemo, Received Radiation - Currently receiving radiation Psychiatric Patient has history of Confinement Anxiety Denies history of Anorexia/bulimia Hospitalization/Surgery History - [place], - [facility], [facility] History Notes Constitutional Symptoms (General Health) Depression, Anxiety, High Cholesterol, Allergies; Brain Cancer, Radiation and Chemo currently Review of Systems (ROS) Constitutional Symptoms (General Health) The patient has no complaints or symptoms. Jessica [name], Jessica [name] ([id number]) Eyes The patient has no complaints or symptoms. Ear/Nose/Mouth/Throat The patient has no complaints or symptoms. Hematologic/Lymphatic The patient has no complaints or symptoms. Respiratory The patient has no complaints or symptoms. Cardiovascular The patient has no complaints or symptoms. Gastrointestinal The patient has no complaints or symptoms. Endocrine The patient has no complaints or symptoms. Genitourinary The patient has no complaints or  symptoms. Immunological The patient has no complaints or symptoms. Integumentary (Skin) Complains or has symptoms of Wounds. Denies complaints or symptoms of Bleeding or bruising tendency, Breakdown, Swelling. Musculoskeletal The patient has no complaints or symptoms. Neurologic The patient has no complaints or symptoms. Oncologic Brain Cancer Psychiatric Complains or has symptoms of Anxiety. Denies complaints or symptoms of Claustrophobia. Medications Bactrim DS 800 mg-160 mg tablet oral tablet oral acetaminophen 325 mg tablet oral tablet oral oxycodone 5 mg tablet oral tablet oral alprazolam 1 mg tablet oral tablet oral buspirone 5 mg tablet oral tablet oral levetiracetam 1,000 mg tablet oral tablet oral ondansetron 8 mg disintegrating tablet oral tablet,disintegrating oral atorvastatin 40 mg tablet oral tablet oral temozolomide 100 mg capsule oral capsule oral temozolomide 20 mg capsule oral capsule oral dexamethasone 4 mg tablet oral tablet oral Miralax 17 gram oral powder packet oral powder in packet oral sennosides 8.6 mg-docusate sodium 50 mg tablet oral tablet oral Jessica [name], Jessica C. ([id number]) montelukast 10 mg tablet oral tablet oral hydrocodone-homatropine 5 mg-1.5 mg/5 mL syrup oral syrup oral fluticasone 50 mcg/actuation nasal spray,suspension nasal spray,suspension nasal mometasone 50 mcg/actuation nasal spray nasal spray,non-aerosol nasal bupropion HCl XL 300 mg 24 hr tablet, extended release oral tablet extended release 24 hr oral potassium chloride ER 20 mEq tablet,extended release oral tablet extended release oral omeprazole 20 mg capsule,delayed release oral capsule,delayed release(DR/EC) oral zolpidem 5 mg tablet oral tablet oral sertraline 25 mg tablet  oral tablet oral trazodone 50 mg tablet oral tablet oral bacitracin 500 unit/gram topical ointment topical ointment topical Vesicare 10 mg tablet oral tablet oral Objective Constitutional Pulse regular.  Respirations normal and unlabored. Afebrile. Vitals Time Taken: 12:58 PM, Height: 65 in, Temperature: 98.5 F, Pulse: 105 bpm, Respiratory Rate: 16 breaths/min, Blood Pressure: 126/59 mmHg. Eyes Nonicteric. Reactive to light. Ears, Nose, Mouth, and Throat Lips, teeth, and gums WNL.Marland Kitchen Moist mucosa without lesions. Neck supple and nontender. No palpable supraclavicular or cervical adenopathy. Normal sized without goiter. Respiratory WNL. No retractions.. Cardiovascular Pedal Pulses WNL. No clubbing, cyanosis or edema. Gastrointestinal (GI) Abdomen without masses or tenderness.. No liver or spleen enlargement or tenderness.. Lymphatic No adneopathy. No adenopathy. No adenopathy. Musculoskeletal Adexa without tenderness or enlargement.. Digits and nails w/o clubbing, cyanosis, infection, petechiae, ischemia, or inflammatory conditions.Marland Kitchen Jessica [name], Jessica [name] ([id number]) Psychiatric Judgement and insight Intact.. No evidence of depression, anxiety, or agitation.. General Notes: she has stage I gluteal pressure ulcers bilaterally with no evidence of surrounding cellulitis or inflammation. No sharp debridement was required today. Integumentary (Hair, Skin) No suspicious lesions. No crepitus or fluctuance. No peri-wound warmth or erythema. No masses.. Wound #1 status is Open. Original cause of wound was Pressure Injury. The wound is located on the Left Gluteus. The wound measures 9.5cm length x 12cm width x 0.1cm depth; 89.535cm^2 area and 8.954cm^3 volume. There is no tunneling or undermining noted. There is a none present amount of drainage noted. The wound margin is flat and intact. There is large (67-100%) granulation within the wound bed. There is no necrotic tissue within the wound bed. The periwound skin appearance exhibited: Scarring. Assessment Active Problems ICD-10 L89.321 - Pressure ulcer of left buttock, stage 1 L89.311 - Pressure ulcer of right buttock, stage 1 R54 - Age-related  physical debility this 61 year old patient with a recent brain surgery followed by radiation and chemotherapy is debilitated marginally and has been recumbent or sitting for long periods of time. After review today I have noted she has stage I pressure injuries bilateral gluteal areas and I have strongly recommended: 1. Bordered foam to be changed every other day 2. Offloading is been discussed with the patient and her husband in great detail 3. Adequate protein, vitamin A, vitamin C and zinc 4. Appropriate bed surfaces and cushions for her recliner and chair 5. Review in the wound care clinic next week Plan Wound Cleansing: Wound #1 Left Gluteus: Cleanse wound with mild soap and water Skin Barriers/Peri-Wound Care: Jessica [name], Jessica [name] ([id number]) Wound #1 Left Gluteus: Barrier cream Moisturizing lotion Primary Wound Dressing: Wound #1 Left Gluteus: Boardered Foam Dressing Dressing Change Frequency: Wound #1 Left Gluteus: Three times weekly - as needed Follow-up Appointments: Wound #1 Left Gluteus: Return Appointment in 1 week. Off-Loading: Wound #1 Left Gluteus: Turn and reposition every 2 hours Additional Orders / Instructions: Wound #1 Left Gluteus: Increase protein intake. Other: - Vitamin A, Vitamin C and Zinc this 61 year old patient with a recent brain surgery followed by radiation and chemotherapy is debilitated marginally and has been recumbent or sitting for long periods of time. After review today I have noted she has stage I pressure injuries bilateral gluteal areas and I have strongly recommended: 1. Bordered foam to be changed every other day 2. Offloading is been discussed with the patient and her husband in great detail 3. Adequate protein, vitamin A, vitamin C and zinc 4. Appropriate bed surfaces and cushions for her recliner and chair 5. Review in the wound care clinic next  week Electronic Signature(s) Signed: 08/31/2016 1:43:15 PM By: Christin Fudge MD,  FACS Entered By: Christin Fudge on 08/31/2016 13:43:14 Jessica [name] ([id number]) -------------------------------------------------------------------------------- ROS/PFSH Details Patient Name: Jessica [name] Date of Service: 08/31/2016 12:45 PM Medical Record Number: [id number] Patient Account Number: [id number] Date of Birth/Sex: 08/25/1955 (61 y.o. Female) Treating RN: Cornell Barman Primary Care Provider: Frazier Richards Other Clinician: Referring Provider: Nolon Stalls Treating Provider/Extender: Frann Rider in Treatment: 0 Information Obtained From Patient Wound History Do you currently have one or more open woundso Yes How many open wounds do you currently haveo 1 Approximately how long have you had your woundso 1 month How have you been treating your wound(s) until nowo abx cream Has your wound(s) ever healed and then re-openedo No Have you had any lab work done in the past montho Yes Have you tested positive for osteomyelitis (bone infection)o No Have you had any tests for circulation on your legso No Constitutional Symptoms (General Health) Complaints and Symptoms: No Complaints or Symptoms Complaints and Symptoms: Negative for: Fatigue; Fever; Chills; Marked Weight Change Medical History: Past Medical History Notes: Depression, Anxiety, High Cholesterol, Allergies; Brain Cancer, Radiation and Chemo currently Endocrine Complaints and Symptoms: No Complaints or Symptoms Complaints and Symptoms: Negative for: Hepatitis; Thyroid disease; Polydypsia (Excessive Thirst) Medical History: Negative for: Type I Diabetes; Type II Diabetes Genitourinary Complaints and Symptoms: No Complaints or Symptoms Complaints and Symptoms: Negative for: Incontinence/dribbling Jessica [name], Jessica [name] ([id number]) Medical History: Negative for: End Stage Renal Disease Integumentary (Skin) Complaints and Symptoms: Positive for: Wounds Negative for: Bleeding or bruising  tendency; Breakdown; Swelling Medical History: Negative for: History of Burn; History of pressure wounds Psychiatric Complaints and Symptoms: Positive for: Anxiety Negative for: Claustrophobia Medical History: Positive for: Confinement Anxiety Negative for: Anorexia/bulimia Eyes Complaints and Symptoms: No Complaints or Symptoms Medical History: Negative for: Cataracts; Glaucoma; Optic Neuritis Ear/Nose/Mouth/Throat Complaints and Symptoms: No Complaints or Symptoms Medical History: Positive for: Chronic sinus problems/congestion Negative for: Middle ear problems Hematologic/Lymphatic Complaints and Symptoms: No Complaints or Symptoms Medical History: Negative for: Anemia; Human Immunodeficiency Virus; Lymphedema; Sickle Cell Disease Respiratory Complaints and Symptoms: No Complaints or Symptoms Jessica [name], CELIA. ([id number]) Medical History: Negative for: Aspiration; Asthma; Chronic Obstructive Pulmonary Disease (COPD); Pneumothorax; Sleep Apnea; Tuberculosis Cardiovascular Complaints and Symptoms: No Complaints or Symptoms Medical History: Negative for: Angina; Arrhythmia; Congestive Heart Failure; Coronary Artery Disease; Deep Vein Thrombosis; Hypertension; Hypotension; Myocardial Infarction; Peripheral Arterial Disease; Peripheral Venous Disease; Phlebitis; Vasculitis Gastrointestinal Complaints and Symptoms: No Complaints or Symptoms Medical History: Negative for: Cirrhosis ; Colitis; Crohnos; Hepatitis A; Hepatitis B; Hepatitis C Immunological Complaints and Symptoms: No Complaints or Symptoms Medical History: Negative for: Lupus Erythematosus; Raynaudos; Scleroderma Musculoskeletal Complaints and Symptoms: No Complaints or Symptoms Medical History: Negative for: Gout; Rheumatoid Arthritis; Osteoarthritis; Osteomyelitis Neurologic Complaints and Symptoms: No Complaints or Symptoms Medical History: Negative for: Dementia; Neuropathy; Quadriplegia;  Paraplegia; Seizure Disorder Oncologic Complaints and Symptoms: Review of System Notes: Brain Cancer YIANNA, TERSIGNI ([id number]) Medical History: Positive for: Received Chemotherapy - Current n Chemo; Received Radiation - Currently receiving radiation HBO Extended History Items Ear/Nose/Mouth/Throat: Chronic sinus problems/congestion Immunizations Pneumococcal Vaccine: Received Pneumococcal Vaccination: Yes Hospitalization / Surgery History Name of Hospital Purpose of Hospitalization/Surgery Date [place] Family and Social History Cancer: Yes - Mother; Diabetes: Yes - Father; Heart Disease: Yes - Mother; Hypertension: Yes - Father; Kidney Disease: No; Lung Disease: No; Seizures: No; Stroke: No; Thyroid Problems: Yes - Mother; Tuberculosis: No; Never smoker; Marital Status - Married;  Alcohol Use: Rarely; Drug Use: No History; Caffeine Use: Rarely; Advanced Directives: No; Patient does not want information on Advanced Directives; Living Will: No; Medical Power of Attorney: No Physician Affirmation I have reviewed and agree with the above information. Electronic Signature(s) Signed: 08/31/2016 4:12:07 PM By: Christin Fudge MD, FACS Signed: 08/31/2016 5:02:55 PM By: Gretta Cool BSN, RN, CWS, Kim RN, BSN Entered By: Christin Fudge on 08/31/2016 13:19:40 Denne, Jessica [name] ([id number]) -------------------------------------------------------------------------------- [place] Details Patient Name: GEORGEANNA, RADZIEWICZ. Date of Service: 08/31/2016 Medical Record Number: [id number] Patient Account Number: [id number] Date of Birth/Sex: Jun 27, 1955 (61 y.o. Female) Treating RN: Baruch Gouty, RN, BSN, Velva Harman Primary Care Provider: Frazier Richards Other Clinician: Referring Provider: Nolon Stalls Treating Provider/Extender: Frann Rider in Treatment: 0 Diagnosis Coding ICD-10 Codes Code Description [phone] Pressure ulcer of left buttock, stage 1 L89.311 Pressure ulcer of  right buttock, stage 1 R54 Age-related physical debility Facility Procedures CPT4 Code: [id number] Description: 99213 - WOUND CARE VISIT-LEV 3 EST PT Modifier: Quantity: 1 Physician Procedures CPT4 Code: [id number] Description: 42353 - WC PHYS LEVEL 4 - NEW PT ICD-10 Description Diagnosis L89.321 Pressure ulcer of left buttock, stage 1 L89.311 Pressure ulcer of right buttock, stage 1 R54 Age-related physical debility Modifier: Quantity: 1 Electronic Signature(s) Signed: 08/31/2016 1:43:47 PM By: Christin Fudge MD, FACS Entered By: Christin Fudge on 08/31/2016 13:43:47

## 2016-09-01 NOTE — Progress Notes (Signed)
[name], [name] ([id number]) Visit Report for 08/31/2016 Allergy List Details Patient Name: Jessica [name], Jessica [name]. Date of Service: 08/31/2016 12:45 PM Medical Record Number: [id number] Patient Account Number: [id number] Date of Birth/Sex: 1955-07-12 (61 y.o. Female) Treating RN: Cornell Barman Primary Care Araiya Tilmon: Frazier Richards Other Clinician: Referring Shaketha Jeon: Nolon Stalls Treating Ardell Makarewicz/Extender: Frann Rider in Treatment: 0 Allergies Active Allergies Compazine penicillin Allergy Notes Electronic Signature(s) Signed: 08/31/2016 5:02:55 PM By: Gretta Cool, BSN, RN, CWS, Kim RN, BSN Entered By: Gretta Cool, BSN, RN, CWS, Kim on 08/31/2016 12:59:45 Jessica [name] ([id number]) -------------------------------------------------------------------------------- Arrival Information Details Patient Name: Jessica [name] Date of Service: 08/31/2016 12:45 PM Medical Record Number: [id number] Patient Account Number: [id number] Date of Birth/Sex: [birth year] (61 y.o. Female) Treating RN: Cornell Barman Primary Care Jake Fuhrmann: Frazier Richards Other Clinician: Referring Libertie Hausler: Nolon Stalls Treating Jguadalupe Opiela/Extender: Frann Rider in Treatment: 0 Visit Information Patient Arrived: Ambulatory Arrival Time: 12:56 Accompanied By: self Transfer Assistance: None Patient Identification Verified: Yes Secondary Verification Process Yes Completed: Patient Has Alerts: Yes Patient Alerts: NOT DIABETIC Electronic Signature(s) Signed: 08/31/2016 5:02:55 PM By: Gretta Cool, BSN, RN, CWS, Kim RN, BSN Entered By: Gretta Cool, BSN, RN, CWS, Kim on 08/31/2016 13:31:02 Jessica [name] ([id number]) -------------------------------------------------------------------------------- Clinic Level of Care Assessment Details Patient Name: [name], [name] Date of Service: 08/31/2016 12:45 PM Medical Record Number: [id number] Patient Account Number: [id number] Date of Birth/Sex: 24-May-1955 (61 y.o.  Female) Treating RN: Cornell Barman Primary Care Torence Palmeri: Frazier Richards Other Clinician: Referring Fendi Meinhardt: Nolon Stalls Treating Adline Kirshenbaum/Extender: Frann Rider in Treatment: 0 Clinic Level of Care Assessment Items TOOL 2 Quantity Score []  - Use when only an EandM is performed on the INITIAL visit 0 ASSESSMENTS - Nursing Assessment / Reassessment []  - General Physical Exam (combine w/ comprehensive assessment (listed just 0 below) when performed on new pt. evals) X - Comprehensive Assessment (HX, ROS, Risk Assessments, Wounds Hx, etc.) 1 25 ASSESSMENTS - Wound and Skin Assessment / Reassessment X - Simple Wound Assessment / Reassessment - one wound 1 5 []  - Complex Wound Assessment / Reassessment - multiple wounds 0 []  - Dermatologic / Skin Assessment (not related to wound area) 0 ASSESSMENTS - Ostomy and/or Continence Assessment and Care []  - Incontinence Assessment and Management 0 []  - Ostomy Care Assessment and Management (repouching, etc.) 0 PROCESS - Coordination of Care X - Simple Patient / Family Education for ongoing care 1 15 []  - Complex (extensive) Patient / Family Education for ongoing care 0 []  - Staff obtains Programmer, systems, Records, Test Results / Process Orders 0 []  - Staff telephones HHA, Nursing Homes / Clarify orders / etc 0 []  - Routine Transfer to another Facility (non-emergent condition) 0 []  - Routine Hospital Admission (non-emergent condition) 0 []  - New Admissions / Biomedical engineer / Ordering NPWT, Apligraf, etc. 0 []  - Emergency Hospital Admission (emergent condition) 0 X - Simple Discharge Coordination 1 10 Beilke, Ceniya C. ([id number]) []  - Complex (extensive) Discharge Coordination 0 PROCESS - Special Needs []  - Pediatric / Minor Patient Management 0 []  - Isolation Patient Management 0 []  - Hearing / Language / Visual special needs 0 []  - Assessment of Community assistance (transportation, D/C planning, etc.) 0 []  - Additional  assistance / Altered mentation 0 []  - Support Surface(s) Assessment (bed, cushion, seat, etc.) 0 INTERVENTIONS - Wound Cleansing / Measurement X - Wound Imaging (photographs - any number of wounds) 1 5 []  - Wound Tracing (instead of photographs) 0 X - Simple Wound Measurement - one wound 1 5 []  -  Complex Wound Measurement - multiple wounds 0 X - Simple Wound Cleansing - one wound 1 5 []  - Complex Wound Cleansing - multiple wounds 0 INTERVENTIONS - Wound Dressings X - Small Wound Dressing one or multiple wounds 1 10 []  - Medium Wound Dressing one or multiple wounds 0 []  - Large Wound Dressing one or multiple wounds 0 []  - Application of Medications - injection 0 INTERVENTIONS - Miscellaneous []  - External ear exam 0 []  - Specimen Collection (cultures, biopsies, blood, body fluids, etc.) 0 []  - Specimen(s) / Culture(s) sent or taken to Lab for analysis 0 []  - Patient Transfer (multiple staff / Civil Service fast streamer / Similar devices) 0 []  - Simple Staple / Suture removal (25 or less) 0 []  - Complex Staple / Suture removal (26 or more) 0 Moorman, Teralyn C. ([id number]) []  - Hypo / Hyperglycemic Management (close monitor of Blood Glucose) 0 []  - Ankle / Brachial Index (ABI) - do not check if billed separately 0 Has the patient been seen at the hospital within the last three years: Yes Total Score: 80 Level Of Care: New/Established - Level 3 Electronic Signature(s) Signed: 08/31/2016 5:02:55 PM By: Gretta Cool, BSN, RN, CWS, Kim RN, BSN Entered By: Gretta Cool, BSN, RN, CWS, Kim on 08/31/2016 13:27:31 [name], [name] ([id number]) -------------------------------------------------------------------------------- Encounter Discharge Information Details Patient Name: Jessica [name] Date of Service: 08/31/2016 12:45 PM Medical Record Number: [id number] Patient Account Number: [id number] Date of Birth/Sex: 1955/08/30 (61 y.o. Female) Treating RN: Cornell Barman Primary Care Shaterrica Territo: Frazier Richards Other  Clinician: Referring Heatherly Stenner: Nolon Stalls Treating Laterrance Nauta/Extender: Frann Rider in Treatment: 0 Encounter Discharge Information Items Discharge Pain Level: 0 Discharge Condition: Stable Ambulatory Status: Ambulatory Discharge Destination: Home Transportation: Private Auto Accompanied By: husband Schedule Follow-up Appointment: Yes Medication Reconciliation completed and provided to Patient/Care Yes Tuyen Uncapher: Provided on Clinical Summary of Care: 08/31/2016 Form Type Recipient Paper Patient SM Electronic Signature(s) Signed: 08/31/2016 1:35:52 PM By: Ruthine Dose Entered By: Ruthine Dose on 08/31/2016 13:35:51 Bua, [name] ([id number]) -------------------------------------------------------------------------------- Multi Wound Chart Details Patient Name: Jessica [name] Date of Service: 08/31/2016 12:45 PM Medical Record Number: [id number] Patient Account Number: [id number] Date of Birth/Sex: 13-Jul-1955 (61 y.o. Female) Treating RN: Cornell Barman Primary Care Adelis Docter: Frazier Richards Other Clinician: Referring Tequita Marrs: Nolon Stalls Treating Marquis Diles/Extender: Frann Rider in Treatment: 0 Vital Signs Height(in): 65 Pulse(bpm): 105 Weight(lbs): Blood Pressure 126/59 (mmHg): Body Mass Index(BMI): Temperature(F): 98.5 Respiratory Rate 16 (breaths/min): Photos: [N/A:N/A] Wound Location: Left Gluteus N/A N/A Wounding Event: Pressure Injury N/A N/A Primary Etiology: Pressure Ulcer N/A N/A Comorbid History: Chronic sinus N/A N/A problems/congestion, Received Chemotherapy, Received Radiation, Confinement Anxiety Date Acquired: 06/29/2016 N/A N/A Weeks of Treatment: 0 N/A N/A Wound Status: Open N/A N/A Clustered Wound: Yes N/A N/A Measurements L x W x D 9.5x12x0.1 N/A N/A (cm) Area (cm) : 89.535 N/A N/A Volume (cm) : 8.954 N/A N/A Classification: Category/Stage I N/A N/A Exudate Amount: None Present N/A N/A Wound Margin: Flat  and Intact N/A N/A Granulation Amount: Large (67-100%) N/A N/A Necrotic Amount: None Present (0%) N/A N/A Exposed Structures: Fascia: No N/A N/A Fat Layer (Subcutaneous Tissue) Exposed: No Tendon: No Schnarr, Jasdeep C. ([id number]) Muscle: No Joint: No Bone: No Epithelialization: Large (67-100%) N/A N/A Periwound Skin Texture: Scarring: Yes N/A N/A Periwound Skin No Abnormalities Noted N/A N/A Moisture: Periwound Skin Color: No Abnormalities Noted N/A N/A Tenderness on No N/A N/A Palpation: Wound Preparation: Ulcer Cleansing: N/A N/A Rinsed/Irrigated with Saline Treatment Notes Wound #1 (Left Gluteus) 1.  Cleansed with: May Shower, gently pat wound dry prior to applying new dressing. 5. Secondary Dressing Applied Bordered Foam Dressing Electronic Signature(s) Signed: 08/31/2016 1:37:30 PM By: Christin Fudge MD, FACS Entered By: Christin Fudge on 08/31/2016 13:37:30 Valko, [name] ([id number]) -------------------------------------------------------------------------------- Barrera Details Patient Name: LUCINDA, SPELLS. Date of Service: 08/31/2016 12:45 PM Medical Record Number: [id number] Patient Account Number: [id number] Date of Birth/Sex: 09/09/1955 (61 y.o. Female) Treating RN: Cornell Barman Primary Care Adelee Hannula: Frazier Richards Other Clinician: Referring Robby Bulkley: Nolon Stalls Treating Kiran Carline/Extender: Frann Rider in Treatment: 0 Active Inactive Electronic Signature(s) Signed: 08/31/2016 5:02:55 PM By: Gretta Cool, BSN, RN, CWS, Kim RN, BSN Entered By: Gretta Cool, BSN, RN, CWS, Kim on 08/31/2016 13:20:56 Jessica [name] ([id number]) -------------------------------------------------------------------------------- Pain Assessment Details Patient Name: Jessica [name] Date of Service: 08/31/2016 12:45 PM Medical Record Number: [id number] Patient Account Number: [id number] Date of Birth/Sex: 20-Jul-1955 (62 y.o. Female) Treating RN: Cornell Barman Primary Care Saliou Barnier: Frazier Richards Other Clinician: Referring Kalii Chesmore: Nolon Stalls Treating Davida Falconi/Extender: Frann Rider in Treatment: 0 Active Problems Location of Pain Severity and Description of Pain Patient Has Paino No Site Locations With Dressing Change: No Pain Management and Medication Current Pain Management: Goals for Pain Management Topical or injectable lidocaine is offered to patient for acute pain when surgical debridement is performed. If needed, Patient is instructed to use over the counter pain medication for the following 24-48 hours after debridement. Wound care MDs do not prescribed pain medications. Patient has chronic pain or uncontrolled pain. Patient has been instructed to make an appointment with their Primary Care Physician for pain management. Electronic Signature(s) Signed: 08/31/2016 5:02:55 PM By: Gretta Cool, BSN, RN, CWS, Kim RN, BSN Entered By: Gretta Cool, BSN, RN, CWS, Kim on 08/31/2016 12:58:32 Jessica [name] ([id number]) -------------------------------------------------------------------------------- Patient/Caregiver Education Details Patient Name: KAMEAH, RAWL Date of Service: 08/31/2016 12:45 PM Medical Record Number: [id number] Patient Account Number: [id number] Date of Birth/Gender: 16-Apr-1955 (61 y.o. Female) Treating RN: Cornell Barman Primary Care Physician: Frazier Richards Other Clinician: Referring Physician: Nolon Stalls Treating Physician/Extender: Frann Rider in Treatment: 0 Education Assessment Education Provided To: Patient Education Topics Provided Welcome To [facility]: Handouts: Welcome To [facility] Methods: Demonstration Responses: State content correctly Wound/Skin Impairment: Handouts: Caring for Your Ulcer Methods: Demonstration Responses: State content correctly Electronic Signature(s) Signed: 08/31/2016 5:02:55 PM By: Gretta Cool, BSN, RN, CWS, Kim RN, BSN Entered  By: Gretta Cool, BSN, RN, CWS, Kim on 08/31/2016 13:30:03 Jessica [name] ([id number]) -------------------------------------------------------------------------------- Wound Assessment Details Patient Name: TIMMY, CLEVERLY. Date of Service: 08/31/2016 12:45 PM Medical Record Number: [id number] Patient Account Number: [id number] Date of Birth/Sex: 10-08-1955 (61 y.o. Female) Treating RN: Cornell Barman Primary Care Kalilah Barua: Frazier Richards Other Clinician: Referring Deah Ottaway: Nolon Stalls Treating Navjot Loera/Extender: Frann Rider in Treatment: 0 Wound Status Wound Number: 1 Primary Pressure Ulcer Etiology: Wound Location: Left Gluteus Wound Open Wounding Event: Pressure Injury Status: Date Acquired: 06/29/2016 Comorbid Chronic sinus problems/congestion, Weeks Of Treatment: 0 History: Received Chemotherapy, Received Clustered Wound: Yes Radiation, Confinement Anxiety Photos Wound Measurements Length: (cm) 9.5 Width: (cm) 12 Depth: (cm) 0.1 Area: (cm) 89.535 Volume: (cm) 8.954 % Reduction in Area: % Reduction in Volume: Epithelialization: Large (67-100%) Tunneling: No Undermining: No Wound Description Classification: Category/Stage I Wound Margin: Flat and Intact Exudate Amount: None Present Wound Bed Granulation Amount: Large (67-100%) Exposed Structure Necrotic Amount: None Present (0%) Fascia Exposed: No Fat Layer (Subcutaneous Tissue) Exposed: No Tendon Exposed: No Muscle Exposed: No Joint Exposed: No  Bone Exposed: No Periwound Skin Texture Pickering, Nevelyn C. ([id number]) Texture Color No Abnormalities Noted: No No Abnormalities Noted: No Scarring: Yes Moisture No Abnormalities Noted: No Wound Preparation Ulcer Cleansing: Rinsed/Irrigated with Saline Treatment Notes Wound #1 (Left Gluteus) 1. Cleansed with: May Shower, gently pat wound dry prior to applying new dressing. 5. Secondary Dressing Applied Bordered Foam Dressing Electronic  Signature(s) Signed: 08/31/2016 5:02:55 PM By: Gretta Cool, BSN, RN, CWS, Kim RN, BSN Entered By: Gretta Cool, BSN, RN, CWS, Kim on 08/31/2016 13:16:19 Jessica [name] ([id number]) -------------------------------------------------------------------------------- Vitals Details Patient Name: Jessica [name] Date of Service: 08/31/2016 12:45 PM Medical Record Number: [id number] Patient Account Number: [id number] Date of Birth/Sex: 08/24/1955 (61 y.o. Female) Treating RN: Cornell Barman Primary Care Jazel Nimmons: Frazier Richards Other Clinician: Referring Mayur Duman: Nolon Stalls Treating Licet Dunphy/Extender: Frann Rider in Treatment: 0 Vital Signs Time Taken: 12:58 Temperature (F): 98.5 Height (in): 65 Pulse (bpm): 105 Respiratory Rate (breaths/min): 16 Blood Pressure (mmHg): 126/59 Reference Range: 80 - 120 mg / dl Electronic Signature(s) Signed: 08/31/2016 5:02:55 PM By: Gretta Cool, BSN, RN, CWS, Kim RN, BSN Entered By: Gretta Cool, BSN, RN, CWS, Kim on 08/31/2016 12:59:10

## 2016-09-01 NOTE — Progress Notes (Signed)
Jessica, [name] ([id number]) Visit Report for 08/31/2016 Abuse/Suicide Risk Screen Details Patient Name: Jessica [name], Jessica [name]. Date of Service: 08/31/2016 12:45 PM Medical Record Number: [id number] Patient Account Number: [id number] Date of Birth/Sex: Aug 04, 1955 (61 y.o. Female) Treating RN: Cornell Barman Primary Care Nillie Bartolotta: Frazier Richards Other Clinician: Referring Dartanyan Deasis: Nolon Stalls Treating Victorious Kundinger/Extender: Frann Rider in Treatment: 0 Abuse/Suicide Risk Screen Items Answer ABUSE/SUICIDE RISK SCREEN: Has anyone close to you tried to hurt or harm you recentlyo No Do you feel uncomfortable with anyone in your familyo No Has anyone forced you do things that you didnot want to doo No Do you have any thoughts of harming yourselfo No Patient displays signs or symptoms of abuse and/or neglect. No Electronic Signature(s) Signed: 08/31/2016 5:02:55 PM By: Gretta Cool, BSN, RN, CWS, Kim RN, BSN Entered By: Gretta Cool, BSN, RN, CWS, Kim on 08/31/2016 13:08:15 Jessica [name] ([id number]) -------------------------------------------------------------------------------- Activities of Daily Living Details Patient Name: Jessica, [name]. Date of Service: 08/31/2016 12:45 PM Medical Record Number: [id number] Patient Account Number: [id number] Date of Birth/Sex: 06-09-1955 (61 y.o. Female) Treating RN: Cornell Barman Primary Care Alanys Godino: Frazier Richards Other Clinician: Referring Juanetta Negash: Nolon Stalls Treating Rosemaria Inabinet/Extender: Frann Rider in Treatment: 0 Activities of Daily Living Items Answer Activities of Daily Living (Please select one for each item) Drive Automobile Not Able Take Medications Need Assistance Use Telephone Need Assistance Care for Appearance Need Assistance Use Toilet Completely Able Bath / Shower Completely Able Dress Self Need Assistance Feed Self Completely Able Walk Completely Able Get In / Out Bed Need Assistance Housework Need  Assistance Prepare Meals Need Assistance Handle Money Need Assistance Shop for Self Need Assistance Electronic Signature(s) Signed: 08/31/2016 5:02:55 PM By: Gretta Cool, BSN, RN, CWS, Kim RN, BSN Entered By: Gretta Cool, BSN, RN, CWS, Kim on 08/31/2016 13:08:53 Jessica [name] ([id number]) -------------------------------------------------------------------------------- Education Assessment Details Patient Name: Jessica [name] Date of Service: 08/31/2016 12:45 PM Medical Record Number: [id number] Patient Account Number: [id number] Date of Birth/Sex: [birth year] (61 y.o. Female) Treating RN: Cornell Barman Primary Care Tyquan Carmickle: Frazier Richards Other Clinician: Referring Tahja Liao: Nolon Stalls Treating Sangeeta Youse/Extender: Frann Rider in Treatment: 0 Primary Learner Assessed: Patient Learning Preferences/Education Level/Primary Language Learning Preference: Explanation, Demonstration Highest Education Level: College or Above Preferred Language: English Cognitive Barrier Assessment/Beliefs Language Barrier: No Translator Needed: No Memory Deficit: No Emotional Barrier: No Cultural/Religious Beliefs Affecting Medical No Care: Physical Barrier Assessment Impaired Vision: Yes Glasses Impaired Hearing: No Decreased Hand dexterity: No Knowledge/Comprehension Assessment Knowledge Level: Medium Comprehension Level: Medium Ability to understand written Medium instructions: Ability to understand verbal Medium instructions: Motivation Assessment Anxiety Level: Calm Cooperation: Cooperative Interest in Health Problems: Asks Questions Perception: Coherent Willingness to Engage in Self- High Management Activities: Readiness to Engage in Self- High Management Activities: Electronic Signature(s) Signed: 08/31/2016 5:02:55 PM By: Gretta Cool, BSN, RN, CWS, Kim RN, BSN Shean, Regan ([id number]) Entered By: Gretta Cool, BSN, RN, CWS, Kim on 08/31/2016 13:09:44 Jessica [name]  ([id number]) -------------------------------------------------------------------------------- Fall Risk Assessment Details Patient Name: Jessica [name] Date of Service: 08/31/2016 12:45 PM Medical Record Number: [id number] Patient Account Number: [id number] Date of Birth/Sex: Jun 05, 1955 (61 y.o. Female) Treating RN: Cornell Barman Primary Care Milli Woolridge: Frazier Richards Other Clinician: Referring Karenann Mcgrory: Nolon Stalls Treating Kino Dunsworth/Extender: Frann Rider in Treatment: 0 Fall Risk Assessment Items Have you had 2 or more falls in the last 12 monthso 0 Yes Have you had any fall that resulted in injury in the last 12 monthso 0 No FALL RISK ASSESSMENT: History of falling -  immediate or within 3 months 25 Yes Secondary diagnosis 0 No Ambulatory aid None/bed rest/wheelchair/nurse 0 Yes Crutches/cane/walker 0 No Furniture 0 No IV Access/Saline Lock 0 No Gait/Training Normal/bed rest/immobile 0 Yes Weak 10 Yes Impaired 0 No Mental Status Oriented to own ability 0 No Electronic Signature(s) Signed: 08/31/2016 5:02:55 PM By: Gretta Cool, BSN, RN, CWS, Kim RN, BSN Entered By: Gretta Cool, BSN, RN, CWS, Kim on 08/31/2016 13:10:20 Jessica [name] ([id number]) -------------------------------------------------------------------------------- Nutrition Risk Assessment Details Patient Name: Jessica, [name]. Date of Service: 08/31/2016 12:45 PM Medical Record Number: [id number] Patient Account Number: [id number] Date of Birth/Sex: [birth year] (61 y.o. Female) Treating RN: Cornell Barman Primary Care Youcef Klas: Frazier Richards Other Clinician: Referring Tycen Dockter: Nolon Stalls Treating Brittain Hosie/Extender: Frann Rider in Treatment: 0 Height (in): 65 Weight (lbs): Body Mass Index (BMI): Nutrition Risk Assessment Items NUTRITION RISK SCREEN: I have an illness or condition that made me change the kind and/or 2 Yes amount of food I eat I eat fewer than two meals per day 0 No I eat  few fruits and vegetables, or milk products 2 Yes I have three or more drinks of beer, liquor or wine almost every day 0 No I have tooth or mouth problems that make it hard for me to eat 0 No I don't always have enough money to buy the food I need 0 No I eat alone most of the time 0 No I take three or more different prescribed or over-the-counter drugs a 1 Yes day Without wanting to, I have lost or gained 10 pounds in the last six 2 Yes months I am not always physically able to shop, cook and/or feed myself 0 No Nutrition Protocols Good Risk Protocol Moderate Risk Protocol Provide education on High Risk Proctocol 0 Electronic Signature(s) Signed: 08/31/2016 5:02:55 PM By: Gretta Cool, BSN, RN, CWS, Kim RN, BSN Entered By: Gretta Cool, BSN, RN, CWS, Kim on 08/31/2016 13:10:56

## 2016-09-01 NOTE — Therapy (Signed)
[place] [facility] SERVICES [address] [place], [place], [address] Phone: [phone]   Fax:  [phone]  Physical Therapy Evaluation  Patient Details  Name: Jessica [name] MRN: [id number] Date of Birth: Dec 01, 1955 Referring Provider: Dr. Nolon Stalls  Encounter Date: 09/01/2016      PT End of Session - 09/01/16 1559    Visit Number 1   Number of Visits 9   Date for PT Re-Evaluation 10/27/16   PT Start Time 9417   PT Stop Time 1404   PT Time Calculation (min) 61 min   Equipment Utilized During Treatment Gait belt   Activity Tolerance Patient tolerated treatment well;No increased pain   Behavior During Therapy [facility] for tasks assessed/performed;Anxious;Flat affect      Past Medical History:  Diagnosis Date  . Asthma    Pt does not use inhaler   . Elevated cholesterol   . Glioblastoma ([place])   . History of chicken pox   . History of measles   . History of mumps     Past Surgical History:  Procedure Laterality Date  . CT Scan of head  2009   negative  . FOOT SURGERY    . LAPAROSCOPY     abdominal endometriosis  . TUBAL LIGATION  1984    There were no vitals filed for this visit.       Subjective Assessment - 09/01/16 1258    Subjective Pt reports that in May she had been having bad headaches and went to the ED; she is now being treated for brain cancer with chemo therapy and radiation. Pt's vision started declining in May and she reports that she currently has vision loss in all fields that is worse to the left and above. Pt reports that her ulcers in her glut area are getting better.   Pertinent History Pt was diagnosed with glioblastoma in May 2018 and had surgery to remove brain tumor 07/02/16. Pt is now undergoing daily radiation treatments and a bout of chemo therapy with Temodar. She is unable to drive due to vision impairments and has noticed impairments in short term memory recently. Pt has a recent history of  bilateral pressure ulcers in her gluteal area; she is being treated by wound care.    Limitations Sitting;Lifting;Standing;Walking;House hold activities;Writing;Reading   How long can you sit comfortably? 30 to an hour   How long can you stand comfortably? 20 min due to weakness   How long can you walk comfortably? 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 No/denies   Multiple Pain Sites No          [facility] PT Assessment - 09/01/16 0001      Assessment   Medical Diagnosis Glioblastoma   Referring Provider Dr. Nolon Stalls   Onset Date/Surgical Date 07/02/16   Next MD Visit Following up weekly for chemotherapy treatment   Prior Therapy No     Precautions   Precautions Fall   Precaution Comments Based on 24m walk test and 5x sit<>stand, and history     Balance Screen   Has the patient fallen in the past 6 months Yes   How many times? approx 3 without serious injury   Has the patient had a  decrease in activity level because of a fear of falling?  Yes   Is the patient reluctant to leave their home because of a fear of falling?  Yes     [place] residence   Living Arrangements Spouse/significant other;Children   Type of [place] to enter   Entrance Stairs-Number of Steps 5   Entrance Stairs-Rails Can reach both   [place] One level   [place] - single point;Walker - 2 wheels   Additional Comments Pt does not use AD; uses UE assit throughout house using furnature and walls     Prior Function   Level of Independence Independent   Vocation Part time employment   Vocation Requirements NA   Leisure `     Cognition   Overall Cognitive Status Within Functional Limits for tasks  assessed   Memory Impaired  Short term recall impaired   Memory Impairment Decreased recall of new information   Executive Function Decision Making;Self Monitoring;Self Correcting;Initiating;Reasoning  Pt noted to have generally slow processing speed     Observation/Other Assessments   Observations Pt sits with relatively upright posture with neutral pelvis; standing pt has incraesed sway   Skin Integrity Pt followed up with wound care yesterday; she reports that her ulcers are healing well with no new issues reported.      Sensation   Light Touch Appears Intact   Additional Comments Pt reports no decrease in sensation and no difference from R to L side     Coordination   Gross Motor Movements are Fluid and Coordinated No   Fine Motor Movements are Fluid and Coordinated No   Coordination and Movement Description Pt has increased aberrant movement in UEs with fine motor tasks and mild intention tremor.   Finger Nose Finger Test Both UE very slow, worse on L      Functional Tests   Functional tests Sit to Stand     Sit to Stand   Comments See 5x sit<>stand     Posture/Postural Control   Posture/Postural Control Postural limitations   Postural Limitations Flexed trunk;Rounded Shoulders   Posture Comments Worse with ambulation, related to balance and visual deficits > postural deformity     ROM / Strength   AROM / PROM / Strength Strength     Strength   Overall Strength Deficits   Overall Strength Comments Pt has grossly decreased strength between 3+ and 4- out of 5 in BLEs   Strength Assessment Site Hip;Knee;Ankle   Right/Left Hip Right;Left   Right Hip Flexion 3/5   Right Hip ABduction 3+/5   Right Hip ADduction 4-/5   Left Hip Flexion 3/5   Left Hip ABduction 3+/5   Left Hip ADduction 4-/5   Right/Left Knee Right;Left   Right Knee Flexion 3+/5   Right Knee Extension 4-/5   Left Knee Flexion 3+/5   Left Knee Extension 4-/5   Right/Left Ankle Right;Left   Right Ankle  Dorsiflexion 4/5   Right Ankle Plantar Flexion 4/5   Left Ankle Dorsiflexion 4/5   Left Ankle Plantar Flexion 4/5     Transfers   Transfers Sit to Stand;Stand to Sit   Sit to Stand 6: Modified independent (Device/Increase time)   Stand to Sit 6: Modified independent (Device/Increase time)   Transfer Cueing Pt cue for seat location and for hand placement for safety due to visual deficits.   Comments Pt requires UE support for  controlled descent when sitting and for assist when standing due to weakness.     Ambulation/Gait   Ambulation/Gait Yes   Ambulation/Gait Assistance 4: Min guard   Ambulation/Gait Assistance Details Pt requires SBA-CGA due to decreased vision and decreased balance due to weakness and coordination deficits   Ambulation Distance (Feet) 70 Feet   Assistive device None   Ambulation Surface Level;Indoor   Gait Comments mildly crouched gait with increased flexion in hips and knees; she has decreased gait speed with mild shuffle gait and decreased stride length.      Balance   Balance Assessed Yes     Static Sitting Balance   Static Sitting - Balance Support No upper extremity supported;Feet supported   Static Sitting - Level of Assistance 7: Independent     Static Standing Balance   Static Standing - Balance Support No upper extremity supported   Static Standing - Level of Assistance 6: Modified independent (Device/Increase time)   Static Standing Balance -  Activities  Romberg - Eyes Opened   Static Standing - Comment/# of Minutes Pt can withstand mi-mod perturbation in standing without UE support and no LOB; with greater perturbation in PA direction pt had posterior LOB requiring minA when PA pressure was released. Pt attempted Romberg stance, but was anable to maintain for greater than 1 sec without LOB requiring minA. Tandem not attepted for pt safety.     Standardized Balance Assessment   Standardized Balance Assessment Five Times Sit to Stand;10 meter walk test    Five times sit to stand comments  28 seconds indicating increased fall risk   10 Meter Walk 0.43m/s indicating pt is a limited community ambulator needs intervention to reduce fall risk.       Treatment:  Ther-ex seated and resisted with green t-band: Pt led in training for HEP with handout given including:  March x 20 reps alternating, band around knees Ankle pumps x 20 alternating band around ankles LAQ x 10 each side with band around ankles Hip abd x 10 each side with band around knees  -all for general LE strengthening in sitting for safety; pt required moderate cues for technique and for continued participation; pt had decreased concept of exercise due to inability to see HEP handout and demonstration; she reports that her husband will be able to help her perform her exercises at home.        Objective measurements completed on examination: See above findings.                  PT Education - 09/01/16 1558    Education provided Yes   Education Details Plan of care, outcome measure interpretation, HEP    Person(s) Educated Patient   Methods Explanation;Demonstration;Tactile cues;Verbal cues   Comprehension Verbal cues required;Tactile cues required;Returned demonstration;Verbalized understanding             PT Long Term Goals - 09/01/16 1620      PT LONG TERM GOAL #1   Title Patient (> 54 years old) will complete five times sit to stand test in < 15 seconds indicating an increased LE strength and improved balance.   Baseline 28 seconds indicating increased fall risk   Time 8   Period Weeks   Status New   Target Date 10/27/16     PT LONG TERM GOAL #2   Title Patient will increase 10 meter walk test to >1.36m/s as to improve gait speed for better community ambulation and to reduce fall risk.  Baseline 0.52m/s indicating pt needs intervention to reduce fall risk   Time 8   Period Weeks   Status New   Target Date 10/27/16     PT LONG TERM GOAL #3    Title Patient will increase BLE gross strength to 4+/5 as to improve functional strength for independent gait, increased standing tolerance and increased ADL ability.   Baseline 3 to 4- out of 5   Time 8   Period Weeks   Status New   Target Date 10/27/16     PT LONG TERM GOAL #4   Title Patient will deny any falls over past 4 weeks to demonstrate improved safety awareness at home and work.    Baseline 3 falls in last 6 months   Time 8   Period Weeks   Status New   Target Date 10/27/16     PT LONG TERM GOAL #5   Title Patient will be independent in floor transfers with 1 or 2 hands, in less than 5 min with moderate difficulty or less for increased safety with getting up off the floor for improved fall recovery.   Baseline Pt unable to transfer from floor without assist.   Time 8   Period Weeks   Status New   Target Date 10/27/16              Plan - 09/01/16 1601    Clinical Impression Statement 61 y/o female presents to therapy following surgery to remove glioblastoma while being treated with concurrent chemo and radiation therapy. She has decreased strength, balance, coordination, and gross vision impairments in the left and upper fields of vision, though she has grossly decreased visual acuity in all fields of vision. She was assessed using the 84m walk test and 5x sit<>stand test, both of which indicated that she is a high fall risk. She will benefit from strength and balance training to increase her energy level and and activity tolerance. She is appropriate for skilled physical therapy at this time to address her deficits in order to maximize safety and mobility.   History and Personal Factors relevant to plan of care: (-) Advanced age, history of depression/anxiety, severity of pathology and treatment, (+) family support   Clinical Presentation Evolving   Clinical Presentation due to: Aggressive pathology, high fall risk with Hx of falls   Clinical Decision Making Moderate    Rehab Potential Fair   Clinical Impairments Affecting Rehab Potential Decreased vision, age, fatigue    PT Frequency 1x / week   PT Duration 8 weeks   PT Treatment/Interventions ADLs/Self Care Home Management;Cryotherapy;Canalith Repostioning;Moist Heat;DME Instruction;Gait training;Neuromuscular re-education;Balance training;Therapeutic exercise;Therapeutic activities;Functional mobility training;Stair training;Cognitive remediation;Patient/family education;Orthotic Fit/Training;Wheelchair mobility training;Manual techniques;Energy conservation;Passive range of motion;Scar mobilization;Vestibular;Visual/perceptual remediation/compensation   PT Next Visit Plan Advanced balance training and advancement of HEP; detailed vision assessment, cranial nerve assessment   PT Home Exercise Plan resisted seated march, LAQ, hip abd, and ankle pumps   Consulted and Agree with Plan of Care Patient      Patient will benefit from skilled therapeutic intervention in order to improve the following deficits and impairments:  Abnormal gait, Decreased activity tolerance, Decreased balance, Decreased knowledge of precautions, Decreased endurance, Decreased coordination, Decreased cognition, Decreased knowledge of use of DME, Decreased mobility, Difficulty walking, Decreased strength, Decreased skin integrity, Impaired perceived functional ability, Impaired vision/preception, Improper body mechanics  Visit Diagnosis: Unsteadiness on feet - Plan: PT plan of care cert/re-cert  Muscle weakness (generalized) - Plan: PT plan of care cert/re-cert  Problem List Patient Active Problem List   Diagnosis Date Noted  . 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 Decubitus ulcer 08/16/2016  . Glioblastoma ([place]) 07/14/2016  . Anxiety 02/21/2015  . Depression 02/21/2015  . 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 Vitamin D deficiency 10/15/2008  . Allergic rhinitis 03/19/2007  . Esophageal reflux 03/19/2007  . Family  history of breast cancer 03/19/2007  . Hypercholesteremia 03/19/2007  . Insomnia 03/19/2007  . Menopausal symptom 03/19/2007   Burnett Corrente, SPT This entire session was performed under direct supervision and direction of a licensed therapist/therapist assistant . I have personally read, edited and approve of the note as written.  Trotter,Margaret PT, DPT 09/01/2016, 4:54 PM  [place] MAIN [facility] SERVICES [address] [place], [place], [address] Phone: [phone]   Fax:  [phone]  Name: Jessica [name] MRN: [id number] Date of Birth: 19-Apr-1955

## 2016-09-02 ENCOUNTER — Ambulatory Visit
Admission: RE | Admit: 2016-09-02 | Discharge: 2016-09-02 | Disposition: A | Discharge status: Home / Self Care | Payer: BLUE CROSS/BLUE SHIELD | Source: Ambulatory Visit | Attending: Radiation Oncology | Admitting: Radiation Oncology

## 2016-09-02 ENCOUNTER — Ambulatory Visit: Payer: BLUE CROSS/BLUE SHIELD | Admitting: Physical Therapy

## 2016-09-02 DIAGNOSIS — C719 Malignant neoplasm of brain, unspecified: Secondary | ICD-10-CM | POA: Diagnosis not present

## 2016-09-03 ENCOUNTER — Ambulatory Visit
Admission: RE | Admit: 2016-09-03 | Discharge: 2016-09-03 | Disposition: A | Discharge status: Home / Self Care | Payer: BLUE CROSS/BLUE SHIELD | Source: Ambulatory Visit | Attending: Radiation Oncology | Admitting: Radiation Oncology

## 2016-09-03 ENCOUNTER — Ambulatory Visit: Payer: BLUE CROSS/BLUE SHIELD | Admitting: Physical Therapy

## 2016-09-03 DIAGNOSIS — C719 Malignant neoplasm of brain, unspecified: Secondary | ICD-10-CM | POA: Diagnosis not present

## 2016-09-04 ENCOUNTER — Other Ambulatory Visit: Payer: Self-pay | Admitting: *Deleted

## 2016-09-04 ENCOUNTER — Ambulatory Visit
Admission: RE | Admit: 2016-09-04 | Discharge: 2016-09-04 | Disposition: A | Discharge status: Home / Self Care | Payer: BLUE CROSS/BLUE SHIELD | Source: Ambulatory Visit | Attending: Radiation Oncology | Admitting: Radiation Oncology

## 2016-09-04 DIAGNOSIS — C719 Malignant neoplasm of brain, unspecified: Secondary | ICD-10-CM | POA: Diagnosis not present

## 2016-09-04 MED ORDER — ALPRAZOLAM 1 MG PO TABS: 1.0000 mg | ORAL_TABLET | Freq: Every day | ORAL | 0 refills | Status: DC | PRN

## 2016-09-07 ENCOUNTER — Inpatient Hospital Stay: Payer: BLUE CROSS/BLUE SHIELD | Admitting: Hematology and Oncology

## 2016-09-07 ENCOUNTER — Ambulatory Visit
Admission: RE | Admit: 2016-09-07 | Discharge: 2016-09-07 | Disposition: A | Discharge status: Home / Self Care | Payer: BLUE CROSS/BLUE SHIELD | Source: Ambulatory Visit | Attending: Radiation Oncology | Admitting: Radiation Oncology

## 2016-09-07 ENCOUNTER — Inpatient Hospital Stay: Payer: BLUE CROSS/BLUE SHIELD

## 2016-09-07 ENCOUNTER — Inpatient Hospital Stay (HOSPITAL_BASED_OUTPATIENT_CLINIC_OR_DEPARTMENT_OTHER): Payer: BLUE CROSS/BLUE SHIELD | Admitting: Hematology and Oncology

## 2016-09-07 ENCOUNTER — Encounter: Payer: BLUE CROSS/BLUE SHIELD | Attending: Physician Assistant | Admitting: Physician Assistant

## 2016-09-07 ENCOUNTER — Inpatient Hospital Stay: Payer: BLUE CROSS/BLUE SHIELD | Attending: Hematology and Oncology

## 2016-09-07 ENCOUNTER — Encounter: Payer: Self-pay | Admitting: Hematology and Oncology

## 2016-09-07 VITALS — BP 126/86 | HR 99 | Temp 98.9°F | Resp 18

## 2016-09-07 DIAGNOSIS — J45909 Unspecified asthma, uncomplicated: Secondary | ICD-10-CM | POA: Insufficient documentation

## 2016-09-07 DIAGNOSIS — B370 Candidal stomatitis: Secondary | ICD-10-CM

## 2016-09-07 DIAGNOSIS — Z85841 Personal history of malignant neoplasm of brain: Secondary | ICD-10-CM | POA: Insufficient documentation

## 2016-09-07 DIAGNOSIS — R54 Age-related physical debility: Secondary | ICD-10-CM | POA: Diagnosis not present

## 2016-09-07 DIAGNOSIS — B379 Candidiasis, unspecified: Secondary | ICD-10-CM | POA: Diagnosis not present

## 2016-09-07 DIAGNOSIS — C719 Malignant neoplasm of brain, unspecified: Secondary | ICD-10-CM

## 2016-09-07 DIAGNOSIS — H53462 Homonymous bilateral field defects, left side: Secondary | ICD-10-CM | POA: Insufficient documentation

## 2016-09-07 DIAGNOSIS — Z9221 Personal history of antineoplastic chemotherapy: Secondary | ICD-10-CM | POA: Insufficient documentation

## 2016-09-07 DIAGNOSIS — H5462 Unqualified visual loss, left eye, normal vision right eye: Secondary | ICD-10-CM | POA: Insufficient documentation

## 2016-09-07 DIAGNOSIS — C718 Malignant neoplasm of overlapping sites of brain: Secondary | ICD-10-CM | POA: Insufficient documentation

## 2016-09-07 DIAGNOSIS — Z923 Personal history of irradiation: Secondary | ICD-10-CM

## 2016-09-07 DIAGNOSIS — L89321 Pressure ulcer of left buttock, stage 1: Secondary | ICD-10-CM | POA: Diagnosis present

## 2016-09-07 DIAGNOSIS — L89309 Pressure ulcer of unspecified buttock, unspecified stage: Secondary | ICD-10-CM | POA: Insufficient documentation

## 2016-09-07 DIAGNOSIS — R51 Headache: Secondary | ICD-10-CM | POA: Insufficient documentation

## 2016-09-07 DIAGNOSIS — Z803 Family history of malignant neoplasm of breast: Secondary | ICD-10-CM | POA: Insufficient documentation

## 2016-09-07 DIAGNOSIS — Z8619 Personal history of other infectious and parasitic diseases: Secondary | ICD-10-CM

## 2016-09-07 DIAGNOSIS — R17 Unspecified jaundice: Secondary | ICD-10-CM

## 2016-09-07 DIAGNOSIS — L89311 Pressure ulcer of right buttock, stage 1: Secondary | ICD-10-CM | POA: Insufficient documentation

## 2016-09-07 DIAGNOSIS — E7800 Pure hypercholesterolemia, unspecified: Secondary | ICD-10-CM | POA: Insufficient documentation

## 2016-09-07 DIAGNOSIS — D72810 Lymphocytopenia: Secondary | ICD-10-CM

## 2016-09-07 LAB — CBC WITH DIFFERENTIAL/PLATELET
Basophils Absolute: 0 10*3/uL (ref 0–0.1)
Basophils Relative: 0 %
Eosinophils Absolute: 0 10*3/uL (ref 0–0.7)
Eosinophils Relative: 0 %
HCT: 40.3 % (ref 35.0–47.0)
Hemoglobin: 14.1 g/dL (ref 12.0–16.0)
Lymphocytes Relative: 3 %
Lymphs Abs: 0.3 10*3/uL — ABNORMAL LOW (ref 1.0–3.6)
MCH: 33 pg (ref 26.0–34.0)
MCHC: 35.1 g/dL (ref 32.0–36.0)
MCV: 94.3 fL (ref 80.0–100.0)
Monocytes Absolute: 0.3 10*3/uL (ref 0.2–0.9)
Monocytes Relative: 3 %
Neutro Abs: 8.7 10*3/uL — ABNORMAL HIGH (ref 1.4–6.5)
Neutrophils Relative %: 94 %
Platelets: 280 10*3/uL (ref 150–440)
RBC: 4.28 MIL/uL (ref 3.80–5.20)
RDW: 17.8 % — ABNORMAL HIGH (ref 11.5–14.5)
WBC: 9.3 10*3/uL (ref 3.6–11.0)

## 2016-09-07 LAB — COMPREHENSIVE METABOLIC PANEL
ALT: 32 U/L (ref 14–54)
AST: 22 U/L (ref 15–41)
Albumin: 3.8 g/dL (ref 3.5–5.0)
Alkaline Phosphatase: 65 U/L (ref 38–126)
Anion gap: 8 (ref 5–15)
BUN: 11 mg/dL (ref 6–20)
CO2: 23 mmol/L (ref 22–32)
Calcium: 8.8 mg/dL — ABNORMAL LOW (ref 8.9–10.3)
Chloride: 100 mmol/L — ABNORMAL LOW (ref 101–111)
Creatinine, Ser: 0.78 mg/dL (ref 0.44–1.00)
GFR calc Af Amer: >60 mL/min (ref >60)
GFR calc non Af Amer: >60 mL/min (ref >60)
Glucose, Bld: 199 mg/dL — ABNORMAL HIGH (ref 65–99)
Potassium: 4.4 mmol/L (ref 3.5–5.1)
Sodium: 131 mmol/L — ABNORMAL LOW (ref 135–145)
Total Bilirubin: 1.3 mg/dL — ABNORMAL HIGH (ref 0.3–1.2)
Total Protein: 6.5 g/dL (ref 6.5–8.1)

## 2016-09-07 LAB — BILIRUBIN, DIRECT: Bilirubin, Direct: 0.2 mg/dL (ref 0.1–0.5)

## 2016-09-07 MED ORDER — SULFAMETHOXAZOLE-TRIMETHOPRIM 800-160 MG PO TABS: 1.0000 | ORAL_TABLET | ORAL | 0 refills | Status: DC

## 2016-09-07 NOTE — Progress Notes (Signed)
Patient offers no complaints today.  Wants to know if she is to continue Septra?  If so will need refill.

## 2016-09-07 NOTE — Progress Notes (Signed)
[facility] day:  09/07/2016   Chief Complaint: Jessica [name] is a 61 y.o. female with a glioblastoma who is seen for assessment on week #5 concurrent Temodar and radiation.  HPI:  The patient was last seen in the medical oncology clinic on 08/31/2016.  At that time, she continued to feel stronger.  She had stable visual loss (left homonymous hemianopsia).  She had a gluteal fold decubitus ulcer and was attending the wound care clinic.  She needed assistance with some of her ADLs.  Counts were stable.  She has continued radiation.  She is on Decadron 4 mg/day.  Symptomatically, she continues to do well. She voices no complaint. She has an appointment with wound care today.  Her decubitus ulcer has improved. She is taking her Septra and needs a new prescription.  Radiation is scheduled to end on 09/21/2016.   Past Medical History:  Diagnosis Date  . Asthma    Pt does not use inhaler   . Elevated cholesterol   . Glioblastoma ([place])   . History of chicken pox   . History of measles   . History of mumps     Past Surgical History:  Procedure Laterality Date  . CT Scan of head  2009   negative  . FOOT SURGERY    . LAPAROSCOPY     abdominal endometriosis  . TUBAL LIGATION  1984    Family History  Problem Relation Age of Onset  . Breast cancer Mother 47  . Depression Mother   . Hyperlipidemia Mother   . Hypothyroidism Mother   . Hypertension Mother   . Diabetes Father   . Hypertension Father   . Hypertension Brother   . Hypertension Sister     Social History:  reports that she has never smoked. She has never used smokeless tobacco. She reports that she drinks alcohol. She reports that she does not use drugs.  She has 2 children (age 39 and 28).  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  She lives in [place].  The patient is accompanied by her husband, Yvone Neu, today.  Allergies:  Allergies  Allergen Reactions  .  Augmentin [Amoxicillin-Pot Clavulanate] Nausea And Vomiting  . Compazine  [Prochlorperazine]     swelling, SOB.  Marland Kitchen Oxycodone Nausea Only    Current Medications: Current Outpatient Prescriptions  Medication Sig Dispense Refill  . acetaminophen (TYLENOL) 325 MG tablet Take 975 mg by mouth every 6 (six) hours as needed.    . ALPRAZolam (XANAX) 1 MG tablet Take 1 tablet (1 mg total) by mouth daily as needed for anxiety. 30 tablet 0  . atorvastatin (LIPITOR) 40 MG tablet TAKE 1 TABLET DAILY 90 tablet 4  . bacitracin 500 UNIT/GM ointment Apply 1 application topically daily.    . busPIRone (BUSPAR) 5 MG tablet Take 1 tablet (5 mg total) by mouth 3 (three) times daily. 270 tablet 3  . dexamethasone (DECADRON) 4 MG tablet Take 1 tablet (4 mg total) by mouth daily. 50 tablet 0  . fluticasone (FLONASE) 50 MCG/ACT nasal spray SHAKE LIQUID AND USE 2 SPRAYS IN EACH NOSTRIL DAILY 16 g 5  . levETIRAcetam (KEPPRA) 1000 MG tablet Take 1,000 mg by mouth See admin instructions. tk 1019m bid x 10 days, if no seizures then decrease to 5022mbid for 10 days, then if no seizures may stop    . mometasone (NASONEX) 50 MCG/ACT nasal spray Place 2 sprays into  the nose daily.    . montelukast (SINGULAIR) 10 MG tablet Take 10 mg by mouth at bedtime.    Marland Kitchen omeprazole (PRILOSEC) 20 MG capsule Take 1 capsule (20 mg total) by mouth daily. 90 capsule 3  . ondansetron (ZOFRAN-ODT) 8 MG disintegrating tablet Take 1 tablet (8 mg total) by mouth every 8 (eight) hours as needed for nausea or vomiting. 30 tablet 1  . oxyCODONE (OXY IR/ROXICODONE) 5 MG immediate release tablet Take 5 mg by mouth every 6 (six) hours as needed.    . polyethylene glycol (MIRALAX / GLYCOLAX) packet Take 17 g by mouth daily as needed.    . potassium chloride SA (K-DUR,KLOR-CON) 20 MEQ tablet Take 1 tablet (20 mEq total) by mouth daily. 7 tablet 0  . sennosides-docusate sodium (SENOKOT-S) 8.6-50 MG tablet Take 2 tablets by mouth 2 (two) times daily.    .  sertraline (ZOLOFT) 25 MG tablet Take by mouth.    . solifenacin (VESICARE) 10 MG tablet Take by mouth daily.    . traZODone (DESYREL) 50 MG tablet Take 50-100 mg by mouth at bedtime.    Marland Kitchen buPROPion (WELLBUTRIN XL) 300 MG 24 hr tablet Take 1 tablet (300 mg total) by mouth daily. (Patient not taking: Reported on 07/27/2016) 90 tablet 3  . HYDROcodone-homatropine (HYCODAN) 5-1.5 MG/5ML syrup 5 ml 4-6 hours as needed for cough (Patient not taking: Reported on 07/07/2016) 240 mL 0  . sulfamethoxazole-trimethoprim (BACTRIM DS,SEPTRA DS) 800-160 MG tablet Take 1 tablet by mouth 3 (three) times a week. (Patient not taking: Reported on 08/10/2016) 30 tablet 0  . temozolomide (TEMODAR) 100 MG capsule May take on an empty stomach or at bedtime to decrease nausea & vomiting. (Patient not taking: Reported on 07/27/2016) 42 capsule 0  . temozolomide (TEMODAR) 20 MG capsule Take 2 capsules (40 mg total) by mouth daily. May take on an empty stomach or at bedtime to decrease nausea & vomiting. (Patient not taking: Reported on 07/27/2016) 84 capsule 0  . zolpidem (AMBIEN) 5 MG tablet TAKE 1 TABLET BY MOUTH EVERY NIGHT AT BEDTIME AS NEEDED FOR INSOMNIA (Patient not taking: Reported on 07/07/2016) 30 tablet 5   No current facility-administered medications for this visit.     Review of Systems:  GENERAL:  Feels "ok".  No fevers or sweats. No new weight.  Previously weighed 170-180 pounds. PERFORMANCE STATUS (ECOG):  2-3 HEENT:  Left sided vision loss (no change).  No runny nose, sore throat, mouth sores or tenderness. Lungs: No shortness of breath or cough.  No hemoptysis. Cardiac:  No chest pain, palpitations, orthopnea, or PND. GI:  No nausea, vomiting, diarrhea, constipation, melena or hematochezia.  Pain pills cause emesis. GU:  No urgency, frequency, dysuria, or hematuria. Musculoskeletal:  No back pain.  No joint pain.  No muscle tenderness. Extremities:  Legs weak.  No pain or swelling. Skin:  Decubitus ulcer,  improving.  No other rashes or skin changes. Neuro:  Left homonymous hemianopsia.  No headache, focal numbness, balance or coordination issues. Endocrine:  No diabetes, thyroid issues, hot flashes or night sweats. Psych:  No mood changes, depression or anxiety. Pain:  No focal pain. Review of systems:  All other systems reviewed and found to be negative.  Physical Exam: Blood pressure 126/86, pulse 99, temperature 98.9 F (37.2 C), temperature source Tympanic, resp. rate 18. GENERAL:  Well developed, well nourished, woman sitting comfortably in the exam room in no acute distress.  She requires a 2 person assist onto the  exam table.   MENTAL STATUS:  Alert and oriented to person, place and time. HEAD:  Wearing a light turquoise hat.  Shoulder length light brown hair.  Normocephalic, atraumatic, face full and symmetric, no Cushingoid features. EYES:  Glasses.  Blue eyes.  Pupils equal round and reactive to light and accomodation.  No conjunctivitis or scleral icterus. ENT:  Thrush.  Tongue normal. Mucous membranes moist.  RESPIRATORY:  Clear to auscultation without rales, wheezes or rhonchi. CARDIOVASCULAR:  Regular rate and rhythm without murmur, rub or gallop. ABDOMEN:  Soft, non-tender, with active bowel sounds, and no hepatosplenomegaly.  No masses. SKIN:  Bilateral gluteal fold decubitus ulcer not visualized.  Right forearm bruise, fading.  EXTREMITIES: No edema, no skin discoloration or tenderness.  No palpable cords. LYMPH NODES: No palpable cervical, supraclavicular, axillary or inguinal adenopathy  NEUROLOGICAL: Stable.  No improvement in visual symptoms (left homonymous hemianopsia). PSYCH:  Appropriate.   Appointment on 09/07/2016  Component Date Value Ref Range Status  . WBC 09/07/2016 9.3  3.6 - 11.0 K/uL Final  . RBC 09/07/2016 4.28  3.80 - 5.20 MIL/uL Final  . Hemoglobin 09/07/2016 14.1  12.0 - 16.0 g/dL Final  . HCT 09/07/2016 40.3  35.0 - 47.0 % Final  . MCV 09/07/2016  94.3  80.0 - 100.0 fL Final  . MCH 09/07/2016 33.0  26.0 - 34.0 pg Final  . MCHC 09/07/2016 35.1  32.0 - 36.0 g/dL Final  . RDW 09/07/2016 17.8* 11.5 - 14.5 % Final  . Platelets 09/07/2016 280  150 - 440 K/uL Final  . Neutrophils Relative % 09/07/2016 94  % Final  . Neutro Abs 09/07/2016 8.7* 1.4 - 6.5 K/uL Final  . Lymphocytes Relative 09/07/2016 3  % Final  . Lymphs Abs 09/07/2016 0.3* 1.0 - 3.6 K/uL Final  . Monocytes Relative 09/07/2016 3  % Final  . Monocytes Absolute 09/07/2016 0.3  0.2 - 0.9 K/uL Final  . Eosinophils Relative 09/07/2016 0  % Final  . Eosinophils Absolute 09/07/2016 0.0  0 - 0.7 K/uL Final  . Basophils Relative 09/07/2016 0  % Final  . Basophils Absolute 09/07/2016 0.0  0 - 0.1 K/uL Final  . Sodium 09/07/2016 131* 135 - 145 mmol/L Final  . Potassium 09/07/2016 4.4  3.5 - 5.1 mmol/L Final  . Chloride 09/07/2016 100* 101 - 111 mmol/L Final  . CO2 09/07/2016 23  22 - 32 mmol/L Final  . Glucose, Bld 09/07/2016 199* 65 - 99 mg/dL Final  . BUN 09/07/2016 11  6 - 20 mg/dL Final  . Creatinine, Ser 09/07/2016 0.78  0.44 - 1.00 mg/dL Final  . Calcium 09/07/2016 8.8* 8.9 - 10.3 mg/dL Final  . Total Protein 09/07/2016 6.5  6.5 - 8.1 g/dL Final  . Albumin 09/07/2016 3.8  3.5 - 5.0 g/dL Final  . AST 09/07/2016 22  15 - 41 U/L Final  . ALT 09/07/2016 32  14 - 54 U/L Final  . Alkaline Phosphatase 09/07/2016 65  38 - 126 U/L Final  . Total Bilirubin 09/07/2016 1.3* 0.3 - 1.2 mg/dL Final  . GFR calc non Af Amer 09/07/2016 >60  >60 mL/min Final  . GFR calc Af Amer 09/07/2016 >60  >60 mL/min Final   Comment: (NOTE) The eGFR has been calculated using the CKD EPI equation. This calculation has not been validated in all clinical situations. eGFR's persistently <60 mL/min signify possible Chronic Kidney Disease.   . Anion gap 09/07/2016 8  5 - 15 Final    Assessment:  TAFFY DELCONTE is a 61 y.o. 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    Head MRI at [facility] on 07/01/2016 revealed a 5.7 x 4.5 cm heterogeneously enhancing T2 hyperintense lesion in the right is posterior medial temporal lobe with extension into the adjacent right parietal and occipital lobes.  There was a separate enhancing component along the ependymal surface at the body of the right lateral ventricle extending into the centrum semiovale. There was marked mass effect on the atrium and occipital horn of the right lateral ventricle with slight increase in dilation of the temporal horn of the right lateral ventricle, consistent with at least partial entrapment. There was stable dilation of the left lateral ventricle.  There was no hemorrhage or large acute cortical infarction.   Head MRI at [facility] on 07/02/2016 revealed substantial resection of the mass in the right cerebral hemisphere with residual disease disease. There was T2/FLAIR signal hyperintensity along the margins of the resection cavity. There was focal masslike T2/FLAIR signal hyperintensity and enhancement in the right frontal periventricular white matter, and within the splenium of the corpus callosum compatible with residual disease. There was enhancement along the margins of the resection cavity, which appeared nodular at the posterolateral aspect.  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  She began cranial radiation on 08/06/2016.  She is currently week #5 Temodar (began on 08/10/2016).  She missed 1 dose.  She is on Bactrim prophylaxis.  She is on Decadron 4 mg a day.  Symptomatically, she has stable visual loss (left homonymous hemianopsia) secondary to right occipital lobe tumor and infarct.  She has a gluteal fold decubitus ulcer and is followed by the wound care clinic.  She needs assistance with some of her ADLs.  She has thrush.  Bilirubin is 1.3 (direct 0.2).  Plan: 1.  Labs today:  CBC with diff, CMP, direct bilirubin. 2.  Continue Temodar 140 mg daily. 3.  Continue  Decadron 4 mg a day.  Taper per radiation therapy. 4.  Continue radiation every Monday - Friday.  Per patient's husband, last day of radiation is 09/21/2016. 5.  Rx: Septra DS 1 tablet every Monday, Wednesday, Friday for PCP prophylaxis.  Continue post completion of concurrent radiation and Temodar until resolution of lymphopenia. 6.  Discuss initiation of Nystatin or clotrimazole troches for thrush.  Patient's husband notes that they have a prescription at home.  RN to call patient at home tomorrow to ensure they have an Rx. 7.  RTC in 1 week for MD assessment and labs (CBC with diff, CMP).   Lequita Asal, MD  09/07/2016, 3:36 PM

## 2016-09-08 ENCOUNTER — Ambulatory Visit
Admission: RE | Admit: 2016-09-08 | Discharge: 2016-09-08 | Disposition: A | Discharge status: Home / Self Care | Payer: BLUE CROSS/BLUE SHIELD | Source: Ambulatory Visit | Attending: Radiation Oncology | Admitting: Radiation Oncology

## 2016-09-08 ENCOUNTER — Ambulatory Visit: Payer: BLUE CROSS/BLUE SHIELD | Admitting: Physical Therapy

## 2016-09-08 DIAGNOSIS — C719 Malignant neoplasm of brain, unspecified: Secondary | ICD-10-CM | POA: Diagnosis not present

## 2016-09-08 NOTE — Progress Notes (Signed)
Jessica [name], Jessica [name] ([id number]) Visit Report for 09/07/2016 Arrival Information Details Patient Name: Jessica [name], Jessica [name]. Date of Service: 09/07/2016 3:45 PM Medical Record Number: [id number] Patient Account Number: [id number] Date of Birth/Sex: 18-May-1955 (61 y.o. Female) Treating RN: Jessica [name], Jessica [name] Primary Care Jessica [name]: Jessica [name] Other Clinician: Referring Jessica [name]: Jessica [name] Treating Jessica [name]/Extender: Jessica [name], Jessica [name]: 1 Visit Information History Since Last Visit All ordered tests and consults were completed: No Patient Arrived: Ambulatory Added or deleted any medications: No Arrival Time: 16:06 Any new allergies or adverse reactions: No Accompanied By: self Had a fall or experienced change in No Transfer Assistance: None activities of daily living that may affect Patient Identification Verified: Yes risk of falls: Secondary Verification Process Yes Signs or symptoms of abuse/neglect since last No Completed: visito Patient Requires Transmission- No Hospitalized since last visit: No Based Precautions: Has Dressing in Place as Prescribed: Yes Patient Has Alerts: Yes Pain Present Now: No Patient Alerts: NOT DIABETIC Electronic Signature(s) Signed: 09/07/2016 4:43:25 PM By: Jessica [name] Entered By: Jessica [name] on 09/07/2016 16:06:46 [name], Jessica [name] ([id number]) -------------------------------------------------------------------------------- Encounter Discharge Information Details Patient Name: Jessica [name] Date of Service: 09/07/2016 3:45 PM Medical Record Number: [id number] Patient Account Number: [id number] Date of Birth/Sex: 1955-02-16 (61 y.o. Female) Treating RN: Jessica [name], Jessica [name] Primary Care Juvia Aerts: Jessica [name] Other Clinician: Referring Jessica [name]: Jessica [name] Treating Jessica [name]/Extender: Jessica [name], Jessica [name]: 1 Encounter Discharge Information Items Discharge Pain Level: 0 Discharge Condition:  Stable Ambulatory Status: Ambulatory Discharge Destination: Home Transportation: Private Auto Accompanied By: husband Schedule Follow-up Appointment: No Medication Reconciliation completed and provided to Patient/Care No Jessica [name]: Provided on Clinical Summary of Care: 09/07/2016 Form Type Recipient Paper Patient SM Electronic Signature(s) Signed: 09/07/2016 4:24:22 PM By: Jessica [name] Entered By: Jessica [name] on 09/07/2016 16:24:22 [name], Jessica [name] ([id number]) -------------------------------------------------------------------------------- Lower Extremity Assessment Details Patient Name: Jessica [name]. Date of Service: 09/07/2016 3:45 PM Medical Record Number: [id number] Patient Account Number: [id number] Date of Birth/Sex: 1955-08-03 (61 y.o. Female) Treating RN: Jessica [name], Jessica [name] Primary Care Zera Markwardt: Jessica [name] Other Clinician: Referring Jessica [name]: Jessica [name] Treating Jessica [name]/Extender: Jessica [name], Jessica [name]: 1 Electronic Signature(s) Signed: 09/07/2016 4:43:25 PM By: Jessica [name] Entered By: Jessica [name] on 09/07/2016 16:13:55 [name], Jessica [name] ([id number]) -------------------------------------------------------------------------------- Multi Wound Chart Details Patient Name: Jessica [name] Date of Service: 09/07/2016 3:45 PM Medical Record Number: [id number] Patient Account Number: [id number] Date of Birth/Sex: 1956/01/26 (61 y.o. Female) Treating RN: Jessica [name], Jessica [name] Primary Care Patrena Santalucia: Jessica [name] Other Clinician: Referring Jessica [name]: Jessica [name] Treating Jessica [name]/Extender: Jessica [name], Jessica [name]: 1 Vital Signs Height(in): 65 Pulse(bpm): 94 Weight(lbs): Blood Pressure 127/76 (mmHg): Body Mass Index(BMI): Temperature(F): 98.2 Respiratory Rate 16 (breaths/min): Wound Assessments [name] Notes Electronic Signature(s) Signed: 09/07/2016 4:43:25 PM By: Jessica [name] Entered By: Jessica [name] on  09/07/2016 16:14:04 Jessica [name] ([id number]) -------------------------------------------------------------------------------- [place] Details Patient Name: Jessica [name], Jessica [name]. Date of Service: 09/07/2016 3:45 PM Medical Record Number: [id number] Patient Account Number: [id number] Date of Birth/Sex: [birth year] (61 y.o. Female) Treating RN: Jessica [name], Jessica [name] Primary Care Lilou Kneip: Jessica [name] Other Clinician: Referring Kendy Haston: Jessica [name] Treating Jessica [name]/Extender: Jessica [name], Jessica [name]: 1 Active Inactive Electronic Signature(s) Signed: 09/07/2016 4:43:25 PM By: Jessica [name] Entered By: Jessica [name] on 09/07/2016 [name], Jessica [name] ([id number]) -------------------------------------------------------------------------------- Pain Assessment Details Patient Name: Jessica [name] Date of Service: 09/07/2016 3:45 PM Medical Record Number: [id number] Patient Account Number: [id number] Date of Birth/Sex: 1955/11/17 (61 y.o. Female) Treating RN: Jessica [name] Primary Care Jessica [name]:  Jessica [name] Other Clinician: Referring Jessica [name]: Jessica [name] Treating Jessica [name]/Extender: Jessica [name], Jessica [name]: 1 Active Problems Location of Pain Severity and Description of Pain Patient Has Paino No Site Locations With Dressing Change: No Pain Management and Medication Current Pain Management: Electronic Signature(s) Signed: 09/07/2016 4:43:25 PM By: Jessica [name] Entered By: Jessica [name] on 09/07/2016 16:06:53 Jessica [name], Jessica [name] ([id number]) -------------------------------------------------------------------------------- Patient/Caregiver Education Details Patient Name: Jessica [name] Date of Service: 09/07/2016 3:45 PM Medical Record Number: [id number] Patient Account Number: [id number] Date of Birth/Gender: 01-Feb-1956 (61 y.o. Female) Treating RN: Jessica [name], Jessica [name] Primary Care Physician: Jessica [name] Other  Clinician: Referring Physician: Frazier [name] Treating Physician/Extender: Jessica [name], Jessica [name]: 1 Education Assessment Education Provided To: Patient and Caregiver husband Education Topics Provided Wound/Skin Impairment: Handouts: Other: Please call or contact our office if you have any questions or concerns. Methods: Explain/Verbal Responses: State content correctly Electronic Signature(s) Signed: 09/07/2016 4:43:25 PM By: Jessica [name] Entered By: Jessica [name] on 09/07/2016 16:18:43 Slatter, Jessica [name] ([id number]) -------------------------------------------------------------------------------- Wound Assessment Details Patient Name: Jessica [name] Date of Service: 09/07/2016 3:45 PM Medical Record Number: [id number] Patient Account Number: [id number] Date of Birth/Sex: 1955/04/25 (61 y.o. Female) Treating RN: Jessica [name], Jessica [name] Primary Care Aleeya Veitch: Jessica [name] Other Clinician: Referring Moe Graca: Jessica [name] Treating Graeme Menees/Extender: Jessica [name], Jessica [name]: 1 Wound Status Wound Number: 1 Primary Pressure Ulcer Etiology: Wound Location: Left Gluteus Wound Healed - Epithelialized Wounding Event: Pressure Injury Status: Date Acquired: 06/29/2016 Comorbid Chronic sinus problems/congestion, Weeks Of [name]: 1 History: Received Chemotherapy, Received Clustered Wound: Yes Radiation, Confinement Anxiety Wound Measurements Length: (cm) 0 % Reductio Width: (cm) 0 % Reductio Depth: (cm) 0 Epithelial Area: (cm) 0 Tunneling Volume: (cm) 0 Undermini n in Area: 100% n in Volume: 100% ization: Large (67-100%) : No ng: No Wound Description Classification: Category/Stage I Wound Margin: Flat and Intact Exudate Amount: None Present Foul Odor After Cleansing: No Slough/Fibrino No Wound Bed Granulation Amount: None Present (0%) Necrotic Amount: None Present (0%) Periwound Skin Texture Texture Color No Abnormalities  Noted: No No Abnormalities Noted: No Moisture No Abnormalities Noted: No Wound Preparation Ulcer Cleansing: Rinsed/Irrigated with Saline Topical Anesthetic Applied: None Electronic Signature(s) Signed: 09/07/2016 4:43:25 PM By: Jessica [name] Entered By: Jessica [name] on 09/07/2016 16:15:55 Pinn, Jessica [name] ([id number]) Lezama, Jessica [name] ([id number]) -------------------------------------------------------------------------------- Vitals Details Patient Name: Jessica [name]. Date of Service: 09/07/2016 3:45 PM Medical Record Number: [id number] Patient Account Number: [id number] Date of Birth/Sex: 1955/12/30 (61 y.o. Female) Treating RN: Jessica [name], Jessica [name] Primary Care Kymberli Wiegand: Jessica [name] Other Clinician: Referring [place]: Jessica [name] Treating Atarah Cadogan/Extender: Jessica [name], Jessica [name]: 1 Vital Signs Time Taken: 16:06 Temperature (F): 98.2 Height (in): 65 Pulse (bpm): 94 Respiratory Rate (breaths/min): 16 Blood Pressure (mmHg): 127/76 Reference Range: 80 - 120 mg / dl Electronic Signature(s) Signed: 09/07/2016 4:43:25 PM By: Jessica [name] Entered By: Jessica [name] on 09/07/2016 16:08:08

## 2016-09-08 NOTE — Progress Notes (Signed)
PATSEY, PITSTICK ([id number]) Visit Report for 09/07/2016 Chief Complaint Document Details Patient Name: Jessica [name], Jessica [name]. Date of Service: 09/07/2016 3:45 PM Medical Record Number: [id number] Patient Account Number: [id number] Date of Birth/Sex: 04/24/1955 (61 y.o. Female) Treating RN: Cornell Barman Primary Care Provider: Frazier Richards Other Clinician: Referring Provider: Frazier Richards Treating Provider/Extender: Melburn Hake, HOYT Weeks in Treatment: 1 Information Obtained from: Patient Chief Complaint Patient is at the clinic for treatment of an open pressure ulcer to both gluteal areas Electronic Signature(s) Signed: 09/07/2016 4:22:53 PM By: Worthy Keeler PA-C Entered By: Worthy Keeler on 09/07/2016 16:19:29 [place], Jessica [name] ([id number]) -------------------------------------------------------------------------------- HPI Details Patient Name: Jessica [name] Date of Service: 09/07/2016 3:45 PM Medical Record Number: [id number] Patient Account Number: [id number] Date of Birth/Sex: January 29, 1956 (61 y.o. Female) Treating RN: Cornell Barman Primary Care Provider: Frazier Richards Other Clinician: Referring Provider: Frazier Richards Treating Provider/Extender: Melburn Hake, HOYT Weeks in Treatment: 1 History of Present Illness Location: both gluteal areas Duration: Patient has had the wound for < 8 weeks prior to presenting for treatment Context: The wound would happen gradually Associated Signs and Symptoms: Patient reports having difficulty standing for long periods. HPI Description: 61 year old female who recently had a diagnosis of a glioblastoma and is on radiation and chemotherapy after her surgery was done on 07/02/2016. She is also feeling generally weak and remains on low-dose steroids. She is on Bactrim 3 times a week. She presents with superficial ulcerations in the gluteal area which have been there for about 8 weeks.past medical history significant for  asthma, glioblastoma, foot surgery and laparoscopy for endometriosis. she is ambulating well but generally weak and has not been getting out much On evaluation today patient's wound over the bilateral gluteal region appears completely healed which is excellent. She is having no discomfort at this point which is an improvement as well. At some points she was having much more pain. Electronic Signature(s) Signed: 09/07/2016 4:22:53 PM By: Worthy Keeler PA-C Entered By: Worthy Keeler on 09/07/2016 16:20:25 Jessica [name] ([id number]) -------------------------------------------------------------------------------- Physical Exam Details Patient Name: Jessica [name], Jessica [name]. Date of Service: 09/07/2016 3:45 PM Medical Record Number: [id number] Patient Account Number: [id number] Date of Birth/Sex: 10/27/1955 (61 y.o. Female) Treating RN: Cornell Barman Primary Care Provider: Frazier Richards Other Clinician: Referring Provider: Frazier Richards Treating Provider/Extender: STONE III, HOYT Weeks in Treatment: 1 Constitutional Obese and well-hydrated in no acute distress. Respiratory normal breathing without difficulty. Psychiatric this patient is able to make decisions and demonstrates good insight into disease process. Alert and Oriented x 3. pleasant and cooperative. Notes Patientos wound appears healed Electronic Signature(s) Signed: 09/07/2016 4:22:53 PM By: Worthy Keeler PA-C Entered By: Worthy Keeler on 09/07/2016 16:20:54 Jessica [name] ([id number]) -------------------------------------------------------------------------------- Physician Orders Details Patient Name: Jessica [name] Date of Service: 09/07/2016 3:45 PM Medical Record Number: [id number] Patient Account Number: [id number] Date of Birth/Sex: 12-Aug-1955 (61 y.o. Female) Treating RN: Carolyne Fiscal, Debi Primary Care Provider: Frazier Richards Other Clinician: Referring Provider: Frazier Richards Treating  Provider/Extender: Melburn Hake, HOYT Weeks in Treatment: 1 Verbal / Phone Orders: Yes Clinician: Carolyne Fiscal, Debi Read Back and Verified: Yes Diagnosis Coding Discharge From [facility] o Discharge from [place] area clean and dry. You may use a barrier cream on the area to protect the area. Change positions every 2 hrs to keep pressure off of area. Please call or contact our office if you have any questions or concerns. Notes At this point we will discontinue wound  care services. I did recommend over-the-counter zinc oxide "Desitin" to be used for the gluteal area in order to protect and prevent further breakdown. I also suggested proper offloading every two hours to ensure no excess pressure to the area. Patient husband was present during the evaluation today and they voiced understanding and agreement. We will see her back in the future as needed. Electronic Signature(s) Signed: 09/07/2016 4:22:53 PM By: Worthy Keeler PA-C Entered By: Worthy Keeler on 09/07/2016 16:21:51 Jessica [name], Jessica [name] ([id number]) -------------------------------------------------------------------------------- Problem List Details Patient Name: Jessica [name], Jessica [name]. Date of Service: 09/07/2016 3:45 PM Medical Record Number: [id number] Patient Account Number: [id number] Date of Birth/Sex: 11-03-1955 (61 y.o. Female) Treating RN: Cornell Barman Primary Care Provider: Frazier Richards Other Clinician: Referring Provider: Frazier Richards Treating Provider/Extender: Melburn Hake, HOYT Weeks in Treatment: 1 Active Problems ICD-10 Encounter Code Description Active Date Diagnosis L89.321 Pressure ulcer of left buttock, stage 1 08/31/2016 Yes L89.311 Pressure ulcer of right buttock, stage 1 08/31/2016 Yes R54 Age-related physical debility 08/31/2016 Yes Inactive Problems Resolved Problems Electronic Signature(s) Signed: 09/07/2016 4:22:53 PM By: Worthy Keeler PA-C Entered By: Worthy Keeler on 09/07/2016  16:19:08 Jessica [name], Jessica [name] ([id number]) -------------------------------------------------------------------------------- Progress Note Details Patient Name: Jessica [name]. Date of Service: 09/07/2016 3:45 PM Medical Record Number: [id number] Patient Account Number: [id number] Date of Birth/Sex: [birth year] (61 y.o. Female) Treating RN: Cornell Barman Primary Care Provider: Frazier Richards Other Clinician: Referring Provider: Frazier Richards Treating Provider/Extender: Melburn Hake, HOYT Weeks in Treatment: 1 Subjective Chief Complaint Information obtained from Patient Patient is at the clinic for treatment of an open pressure ulcer to both gluteal areas History of Present Illness (HPI) The following HPI elements were documented for the patient's wound: Location: both gluteal areas Duration: Patient has had the wound for < 8 weeks prior to presenting for treatment Context: The wound would happen gradually Associated Signs and Symptoms: Patient reports having difficulty standing for long periods. 61 year old female who recently had a diagnosis of a glioblastoma and is on radiation and chemotherapy after her surgery was done on 07/02/2016. She is also feeling generally weak and remains on low-dose steroids. She is on Bactrim 3 times a week. She presents with superficial ulcerations in the gluteal area which have been there for about 8 weeks.past medical history significant for asthma, glioblastoma, foot surgery and laparoscopy for endometriosis. she is ambulating well but generally weak and has not been getting out much On evaluation today patient's wound over the bilateral gluteal region appears completely healed which is excellent. She is having no discomfort at this point which is an improvement as well. At some points she was having much more pain. Objective Constitutional Obese and well-hydrated in no acute distress. Vitals Time Taken: 4:06 PM, Height: 65 in, Temperature: 98.2 F,  Pulse: 94 bpm, Respiratory Rate: 16 breaths/min, Blood Pressure: 127/76 mmHg. Jessica [name], Jessica [name] ([id number]) Respiratory normal breathing without difficulty. Psychiatric this patient is able to make decisions and demonstrates good insight into disease process. Alert and Oriented x 3. pleasant and cooperative. General Notes: Patient s wound appears healed Integumentary (Hair, Skin) Wound #1 status is Healed - Epithelialized. Original cause of wound was Pressure Injury. The wound is located on the Left Gluteus. The wound measures 0cm length x 0cm width x 0cm depth; 0cm^2 area and 0cm^3 volume. There is no tunneling or undermining noted. There is a none present amount of drainage noted. The wound margin is flat and intact. There is no granulation within the wound bed.  There is no necrotic tissue within the wound bed. Assessment Active Problems ICD-10 L89.321 - Pressure ulcer of left buttock, stage 1 L89.311 - Pressure ulcer of right buttock, stage 1 R54 - Age-related physical debility Plan Discharge From [facility]: Discharge from [place] area clean and dry. You may use a barrier cream on the area to protect the area. Change positions every 2 hrs to keep pressure off of area. Please call or contact our office if you have any questions or concerns. General Notes: At this point we will discontinue wound care services. I did recommend over-the-counter zinc oxide "Desitin" to be used for the gluteal area in order to protect and prevent further breakdown. I also suggested proper offloading every two hours to ensure no excess pressure to the area. Patient husband was present during the evaluation today and they voiced understanding and agreement. We will see her back in the future as needed. Jessica [name], Jessica [name] ([id number]) Electronic Signature(s) Signed: 09/07/2016 4:22:53 PM By: Worthy Keeler PA-C Entered By: Worthy Keeler on 09/07/2016 16:22:01 Jessica [name], Jessica [name]  ([id number]) -------------------------------------------------------------------------------- SuperBill Details Patient Name: Jessica [name] Date of Service: 09/07/2016 Medical Record Number: [id number] Patient Account Number: [id number] Date of Birth/Sex: [birth year] (61 y.o. Female) Treating RN: Cornell Barman Primary Care Provider: Frazier Richards Other Clinician: Referring Provider: Frazier Richards Treating Provider/Extender: Melburn Hake, HOYT Weeks in Treatment: 1 Diagnosis Coding ICD-10 Codes Code Description [phone] Pressure ulcer of left buttock, stage 1 L89.311 Pressure ulcer of right buttock, stage 1 R54 Age-related physical debility Physician Procedures CPT4 Code: [id number] Description: 10315 - WC PHYS LEVEL 2 - EST PT ICD-10 Description Diagnosis L89.321 Pressure ulcer of left buttock, stage 1 L89.311 Pressure ulcer of right buttock, stage 1 R54 Age-related physical debility Modifier: Quantity: 1 Electronic Signature(s) Signed: 09/07/2016 4:22:53 PM By: Worthy Keeler PA-C Entered By: Worthy Keeler on 09/07/2016 16:22:21

## 2016-09-09 ENCOUNTER — Ambulatory Visit
Admission: RE | Admit: 2016-09-09 | Discharge: 2016-09-09 | Disposition: A | Discharge status: Home / Self Care | Payer: BLUE CROSS/BLUE SHIELD | Source: Ambulatory Visit | Attending: Radiation Oncology | Admitting: Radiation Oncology

## 2016-09-10 ENCOUNTER — Ambulatory Visit: Payer: BLUE CROSS/BLUE SHIELD | Admitting: Physical Therapy

## 2016-09-10 ENCOUNTER — Ambulatory Visit: Payer: BLUE CROSS/BLUE SHIELD

## 2016-09-10 ENCOUNTER — Ambulatory Visit: Admission: RE | Admit: 2016-09-10 | Payer: BLUE CROSS/BLUE SHIELD | Source: Ambulatory Visit

## 2016-09-10 ENCOUNTER — Ambulatory Visit
Admission: RE | Admit: 2016-09-10 | Discharge: 2016-09-10 | Disposition: A | Discharge status: Home / Self Care | Payer: BLUE CROSS/BLUE SHIELD | Source: Ambulatory Visit | Attending: Radiation Oncology | Admitting: Radiation Oncology

## 2016-09-10 DIAGNOSIS — C719 Malignant neoplasm of brain, unspecified: Secondary | ICD-10-CM | POA: Diagnosis not present

## 2016-09-11 ENCOUNTER — Ambulatory Visit: Payer: BLUE CROSS/BLUE SHIELD

## 2016-09-13 DIAGNOSIS — R17 Unspecified jaundice: Secondary | ICD-10-CM | POA: Insufficient documentation

## 2016-09-13 DIAGNOSIS — B370 Candidal stomatitis: Secondary | ICD-10-CM | POA: Insufficient documentation

## 2016-09-13 DIAGNOSIS — D72810 Lymphocytopenia: Secondary | ICD-10-CM | POA: Insufficient documentation

## 2016-09-14 ENCOUNTER — Inpatient Hospital Stay (HOSPITAL_BASED_OUTPATIENT_CLINIC_OR_DEPARTMENT_OTHER): Payer: BLUE CROSS/BLUE SHIELD | Admitting: Hematology and Oncology

## 2016-09-14 ENCOUNTER — Ambulatory Visit
Admission: RE | Admit: 2016-09-14 | Discharge: 2016-09-14 | Disposition: A | Discharge status: Home / Self Care | Payer: BLUE CROSS/BLUE SHIELD | Source: Ambulatory Visit | Attending: Radiation Oncology | Admitting: Radiation Oncology

## 2016-09-14 ENCOUNTER — Encounter: Payer: Self-pay | Admitting: Hematology and Oncology

## 2016-09-14 ENCOUNTER — Inpatient Hospital Stay: Payer: BLUE CROSS/BLUE SHIELD

## 2016-09-14 VITALS — BP 128/84 | HR 89 | Temp 98.1°F | Resp 18

## 2016-09-14 DIAGNOSIS — J45909 Unspecified asthma, uncomplicated: Secondary | ICD-10-CM | POA: Diagnosis not present

## 2016-09-14 DIAGNOSIS — B379 Candidiasis, unspecified: Secondary | ICD-10-CM

## 2016-09-14 DIAGNOSIS — C719 Malignant neoplasm of brain, unspecified: Secondary | ICD-10-CM | POA: Diagnosis not present

## 2016-09-14 DIAGNOSIS — Z923 Personal history of irradiation: Secondary | ICD-10-CM

## 2016-09-14 DIAGNOSIS — E7800 Pure hypercholesterolemia, unspecified: Secondary | ICD-10-CM | POA: Diagnosis not present

## 2016-09-14 DIAGNOSIS — R51 Headache: Secondary | ICD-10-CM

## 2016-09-14 DIAGNOSIS — C718 Malignant neoplasm of overlapping sites of brain: Secondary | ICD-10-CM

## 2016-09-14 DIAGNOSIS — H53462 Homonymous bilateral field defects, left side: Secondary | ICD-10-CM | POA: Diagnosis not present

## 2016-09-14 DIAGNOSIS — R17 Unspecified jaundice: Secondary | ICD-10-CM

## 2016-09-14 DIAGNOSIS — Z803 Family history of malignant neoplasm of breast: Secondary | ICD-10-CM

## 2016-09-14 DIAGNOSIS — H5462 Unqualified visual loss, left eye, normal vision right eye: Secondary | ICD-10-CM | POA: Diagnosis not present

## 2016-09-14 DIAGNOSIS — Z8619 Personal history of other infectious and parasitic diseases: Secondary | ICD-10-CM | POA: Diagnosis not present

## 2016-09-14 DIAGNOSIS — L89309 Pressure ulcer of unspecified buttock, unspecified stage: Secondary | ICD-10-CM

## 2016-09-14 DIAGNOSIS — Z85841 Personal history of malignant neoplasm of brain: Secondary | ICD-10-CM | POA: Diagnosis not present

## 2016-09-14 DIAGNOSIS — D72810 Lymphocytopenia: Secondary | ICD-10-CM

## 2016-09-14 LAB — CBC WITH DIFFERENTIAL/PLATELET
Basophils Absolute: 0.1 10*3/uL (ref 0–0.1)
Basophils Relative: 1 %
Eosinophils Absolute: 0 10*3/uL (ref 0–0.7)
Eosinophils Relative: 0 %
HCT: 41.3 % (ref 35.0–47.0)
Hemoglobin: 14.6 g/dL (ref 12.0–16.0)
Lymphocytes Relative: 4 %
Lymphs Abs: 0.4 10*3/uL — ABNORMAL LOW (ref 1.0–3.6)
MCH: 33.5 pg (ref 26.0–34.0)
MCHC: 35.3 g/dL (ref 32.0–36.0)
MCV: 94.7 fL (ref 80.0–100.0)
Monocytes Absolute: 0.7 10*3/uL (ref 0.2–0.9)
Monocytes Relative: 7 %
Neutro Abs: 9.1 10*3/uL — ABNORMAL HIGH (ref 1.4–6.5)
Neutrophils Relative %: 88 %
Platelets: 271 10*3/uL (ref 150–440)
RBC: 4.36 MIL/uL (ref 3.80–5.20)
RDW: 17.7 % — ABNORMAL HIGH (ref 11.5–14.5)
WBC: 10.2 10*3/uL (ref 3.6–11.0)

## 2016-09-14 LAB — COMPREHENSIVE METABOLIC PANEL
ALT: 33 U/L (ref 14–54)
AST: 15 U/L (ref 15–41)
Albumin: 3.9 g/dL (ref 3.5–5.0)
Alkaline Phosphatase: 51 U/L (ref 38–126)
Anion gap: 9 (ref 5–15)
BUN: 13 mg/dL (ref 6–20)
CO2: 25 mmol/L (ref 22–32)
Calcium: 9 mg/dL (ref 8.9–10.3)
Chloride: 97 mmol/L — ABNORMAL LOW (ref 101–111)
Creatinine, Ser: 0.8 mg/dL (ref 0.44–1.00)
GFR calc Af Amer: >60 mL/min (ref >60)
GFR calc non Af Amer: >60 mL/min (ref >60)
Glucose, Bld: 117 mg/dL — ABNORMAL HIGH (ref 65–99)
Potassium: 4.5 mmol/L (ref 3.5–5.1)
Sodium: 131 mmol/L — ABNORMAL LOW (ref 135–145)
Total Bilirubin: 1.2 mg/dL (ref 0.3–1.2)
Total Protein: 6.7 g/dL (ref 6.5–8.1)

## 2016-09-14 NOTE — Progress Notes (Signed)
[facility] day:  09/14/2016   Chief Complaint: Jessica [name] is a 61 y.o. female with a glioblastoma who is seen for assessment on week #6 concurrent Temodar and radiation.  HPI:  The patient was last seen in the medical oncology clinic on 09/07/2016.  At that time, she was doing well.  She had stable visual loss (left homonymous hemianopsia).  Her decubitus ulcer had improved. She had thrush.  Radiation continued.  She continued Temodar and Bactrim prophylaxis.  She was to start Nystatin or clotramazole troches at home.  During the interim, she has done well.  She has taken her Nystatin swish and spit twice a day. Her mouth is not as sore. Her decubitus ulcer has healed. She continues her radiation with daily Temodar. She denies any nausea or vomiting.  Regarding her fluids, she is mostly drinking ice tea. She wake up at times with a headache.  She continues Decadron or 4 mg a day. She has a persistent left field visual loss.   Past Medical History:  Diagnosis Date  . Asthma    Pt does not use inhaler   . Elevated cholesterol   . Glioblastoma ([place])   . History of chicken pox   . History of measles   . History of mumps     Past Surgical History:  Procedure Laterality Date  . CT Scan of head  2009   negative  . FOOT SURGERY    . LAPAROSCOPY     abdominal endometriosis  . TUBAL LIGATION  1984    Family History  Problem Relation Age of Onset  . Breast cancer Mother 86  . Depression Mother   . Hyperlipidemia Mother   . Hypothyroidism Mother   . Hypertension Mother   . Diabetes Father   . Hypertension Father   . Hypertension Brother   . Hypertension Sister     Social History:  reports that she has never smoked. She has never used smokeless tobacco. She reports that she drinks alcohol. She reports that she does not use drugs.  She has 2 children (age 68 and 5).  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  She  lives in [place].  The patient is accompanied by her husband, Jessica [name], today.  Allergies:  Allergies  Allergen Reactions  . Augmentin [Amoxicillin-Pot Clavulanate] Nausea And Vomiting  . Compazine  [Prochlorperazine]     swelling, SOB.  Marland Kitchen Oxycodone Nausea Only    Current Medications: Current Outpatient Prescriptions  Medication Sig Dispense Refill  . acetaminophen (TYLENOL) 325 MG tablet Take 975 mg by mouth every 6 (six) hours as needed.    . ALPRAZolam (XANAX) 1 MG tablet Take 1 tablet (1 mg total) by mouth daily as needed for anxiety. 30 tablet 0  . atorvastatin (LIPITOR) 40 MG tablet TAKE 1 TABLET DAILY 90 tablet 4  . bacitracin 500 UNIT/GM ointment Apply 1 application topically daily.    . busPIRone (BUSPAR) 5 MG tablet Take 1 tablet (5 mg total) by mouth 3 (three) times daily. 270 tablet 3  . dexamethasone (DECADRON) 4 MG tablet Take 1 tablet (4 mg total) by mouth daily. 50 tablet 0  . fluticasone (FLONASE) 50 MCG/ACT nasal spray SHAKE LIQUID AND USE 2 SPRAYS IN EACH NOSTRIL DAILY 16 g 5  . levETIRAcetam (KEPPRA) 1000 MG tablet Take 1,000 mg by mouth See admin instructions. tk 1056m bid x 10 days, if no seizures then decrease to  535m bid for 10 days, then if no seizures may stop    . mometasone (NASONEX) 50 MCG/ACT nasal spray Place 2 sprays into the nose daily.    . montelukast (SINGULAIR) 10 MG tablet Take 10 mg by mouth at bedtime.    .Marland Kitchenomeprazole (PRILOSEC) 20 MG capsule Take 1 capsule (20 mg total) by mouth daily. 90 capsule 3  . ondansetron (ZOFRAN-ODT) 8 MG disintegrating tablet Take 1 tablet (8 mg total) by mouth every 8 (eight) hours as needed for nausea or vomiting. 30 tablet 1  . oxyCODONE (OXY IR/ROXICODONE) 5 MG immediate release tablet Take 5 mg by mouth every 6 (six) hours as needed.    . polyethylene glycol (MIRALAX / GLYCOLAX) packet Take 17 g by mouth daily as needed.    . potassium chloride SA (K-DUR,KLOR-CON) 20 MEQ tablet Take 1 tablet (20 mEq total) by mouth  daily. 7 tablet 0  . sennosides-docusate sodium (SENOKOT-S) 8.6-50 MG tablet Take 2 tablets by mouth 2 (two) times daily.    . sertraline (ZOLOFT) 25 MG tablet Take by mouth.    . solifenacin (VESICARE) 10 MG tablet Take by mouth daily.    .Marland Kitchensulfamethoxazole-trimethoprim (BACTRIM DS,SEPTRA DS) 800-160 MG tablet Take 1 tablet by mouth 3 (three) times a week. 30 tablet 0  . traZODone (DESYREL) 50 MG tablet Take 50-100 mg by mouth at bedtime.    .Marland KitchenbuPROPion (WELLBUTRIN XL) 300 MG 24 hr tablet Take 1 tablet (300 mg total) by mouth daily. (Patient not taking: Reported on 07/27/2016) 90 tablet 3  . HYDROcodone-homatropine (HYCODAN) 5-1.5 MG/5ML syrup 5 ml 4-6 hours as needed for cough (Patient not taking: Reported on 07/07/2016) 240 mL 0  . temozolomide (TEMODAR) 100 MG capsule May take on an empty stomach or at bedtime to decrease nausea & vomiting. (Patient not taking: Reported on 07/27/2016) 42 capsule 0  . temozolomide (TEMODAR) 20 MG capsule Take 2 capsules (40 mg total) by mouth daily. May take on an empty stomach or at bedtime to decrease nausea & vomiting. (Patient not taking: Reported on 09/14/2016) 84 capsule 0  . zolpidem (AMBIEN) 5 MG tablet TAKE 1 TABLET BY MOUTH EVERY NIGHT AT BEDTIME AS NEEDED FOR INSOMNIA (Patient not taking: Reported on 07/07/2016) 30 tablet 5   No current facility-administered medications for this visit.     Review of Systems:  GENERAL:  Feels "good".  No fevers or sweats. No new weight.  Previously weighed 170-180 pounds. PERFORMANCE STATUS (ECOG):  2-3 HEENT:  Left sided vision loss (no change).  Thrush, improved.  No runny nose, sore throat, mouth sores or tenderness. Lungs: No shortness of breath or cough.  No hemoptysis. Cardiac:  No chest pain, palpitations, orthopnea, or PND. GI:  No nausea, vomiting, diarrhea, constipation, melena or hematochezia.  Pain pills cause emesis. GU:  No urgency, frequency, dysuria, or hematuria. Musculoskeletal:  No back pain.  No joint  pain.  No muscle tenderness. Extremities:  Legs weak.  No pain or swelling. Skin:  Decubitus ulcer, resolved.  No other rashes or skin changes. Neuro:  Left homonymous hemianopsia.  No headache, focal numbness, balance or coordination issues. Endocrine:  No diabetes, thyroid issues, hot flashes or night sweats. Psych:  No mood changes, depression or anxiety. Pain:  No focal pain. Review of systems:  All other systems reviewed and found to be negative.  Physical Exam: Blood pressure 128/84, pulse 89, temperature 98.1 F (36.7 C), temperature source Tympanic, resp. rate 18. GENERAL:  Well developed,  well nourished, woman sitting comfortably in the exam room in no acute distress.  She requires a 2 person assist onto the exam table.   MENTAL STATUS:  Alert and oriented to person, place and time. HEAD:  Wearing a light turquoise hat.  Shoulder length light brown hair.  Normocephalic, atraumatic, and symmetric.  Cushingoid features. EYES:  Glasses.  Blue eyes.  Pupils equal round and reactive to light and accomodation.  No conjunctivitis or scleral icterus. ENT:  No oral lesions. Tongue normal. Mucous membranes moist.  RESPIRATORY:  Clear to auscultation without rales, wheezes or rhonchi. CARDIOVASCULAR:  Regular rate and rhythm without murmur, rub or gallop. ABDOMEN:  Soft, non-tender, with active bowel sounds, and no hepatosplenomegaly.  No masses. SKIN:  No rashes or ulcers.  EXTREMITIES: No edema, no skin discoloration or tenderness.  No palpable cords. LYMPH NODES: No palpable cervical, supraclavicular, axillary or inguinal adenopathy  NEUROLOGICAL: Stable.  Lower extremity strength 4+/5.  Sensation intact.  No improvement in visual symptoms (left homonymous hemianopsia). PSYCH:  Appropriate.   Appointment on 09/14/2016  Component Date Value Ref Range Status  . Sodium 09/14/2016 131* 135 - 145 mmol/L Final  . Potassium 09/14/2016 4.5  3.5 - 5.1 mmol/L Final  . Chloride 09/14/2016 97*  101 - 111 mmol/L Final  . CO2 09/14/2016 25  22 - 32 mmol/L Final  . Glucose, Bld 09/14/2016 117* 65 - 99 mg/dL Final  . BUN 09/14/2016 13  6 - 20 mg/dL Final  . Creatinine, Ser 09/14/2016 0.80  0.44 - 1.00 mg/dL Final  . Calcium 09/14/2016 9.0  8.9 - 10.3 mg/dL Final  . Total Protein 09/14/2016 6.7  6.5 - 8.1 g/dL Final  . Albumin 09/14/2016 3.9  3.5 - 5.0 g/dL Final  . AST 09/14/2016 15  15 - 41 U/L Final  . ALT 09/14/2016 33  14 - 54 U/L Final  . Alkaline Phosphatase 09/14/2016 51  38 - 126 U/L Final  . Total Bilirubin 09/14/2016 1.2  0.3 - 1.2 mg/dL Final  . GFR calc non Af Amer 09/14/2016 >60  >60 mL/min Final  . GFR calc Af Amer 09/14/2016 >60  >60 mL/min Final   Comment: (NOTE) The eGFR has been calculated using the CKD EPI equation. This calculation has not been validated in all clinical situations. eGFR's persistently <60 mL/min signify possible Chronic Kidney Disease.   . Anion gap 09/14/2016 9  5 - 15 Final  . WBC 09/14/2016 10.2  3.6 - 11.0 K/uL Final  . RBC 09/14/2016 4.36  3.80 - 5.20 MIL/uL Final  . Hemoglobin 09/14/2016 14.6  12.0 - 16.0 g/dL Final  . HCT 09/14/2016 41.3  35.0 - 47.0 % Final  . MCV 09/14/2016 94.7  80.0 - 100.0 fL Final  . MCH 09/14/2016 33.5  26.0 - 34.0 pg Final  . MCHC 09/14/2016 35.3  32.0 - 36.0 g/dL Final  . RDW 09/14/2016 17.7* 11.5 - 14.5 % Final  . Platelets 09/14/2016 271  150 - 440 K/uL Final  . Neutrophils Relative % 09/14/2016 88  % Final  . Neutro Abs 09/14/2016 9.1* 1.4 - 6.5 K/uL Final  . Lymphocytes Relative 09/14/2016 4  % Final  . Lymphs Abs 09/14/2016 0.4* 1.0 - 3.6 K/uL Final  . Monocytes Relative 09/14/2016 7  % Final  . Monocytes Absolute 09/14/2016 0.7  0.2 - 0.9 K/uL Final  . Eosinophils Relative 09/14/2016 0  % Final  . Eosinophils Absolute 09/14/2016 0.0  0 - 0.7 K/uL Final  .  Basophils Relative 09/14/2016 1  % Final  . Basophils Absolute 09/14/2016 0.1  0 - 0.1 K/uL Final    Assessment:  Jessica [name] is a 61 y.o.  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    Head MRI at [facility] on 07/01/2016 revealed a 5.7 x 4.5 cm heterogeneously enhancing T2 hyperintense lesion in the right is posterior medial temporal lobe with extension into the adjacent right parietal and occipital lobes.  There was a separate enhancing component along the ependymal surface at the body of the right lateral ventricle extending into the centrum semiovale. There was marked mass effect on the atrium and occipital horn of the right lateral ventricle with slight increase in dilation of the temporal horn of the right lateral ventricle, consistent with at least partial entrapment. There was stable dilation of the left lateral ventricle.  There was no hemorrhage or large acute cortical infarction.   Head MRI at [facility] on 07/02/2016 revealed substantial resection of the mass in the right cerebral hemisphere with residual disease disease. There was T2/FLAIR signal hyperintensity along the margins of the resection cavity. There was focal masslike T2/FLAIR signal hyperintensity and enhancement in the right frontal periventricular white matter, and within the splenium of the corpus callosum compatible with residual disease. There was enhancement along the margins of the resection cavity, which appeared nodular at the posterolateral aspect.  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  She began cranial radiation on 08/06/2016.  She is currently week #5 Temodar (began on 08/10/2016).  She missed 1 dose.  She is on Bactrim prophylaxis.  She is on Decadron 4 mg a day.  Symptomatically, she has stable visual loss (left homonymous hemianopsia) secondary to right occipital lobe tumor and infarct.  Gluteal fold decubitus ulcer has healed.  She needs minimal assistance with her ADLs.  Ritta Slot has resolved.  Bilirubin is 1.2.  Plan: 1.  Labs today:  CBC with diff, CMP. 2.  Continue Temodar 140 mg daily. 3.   Continue Decadron 4 mg a day.  Taper per radiation therapy. 4.  Continue radiation every Monday - Friday.  Per patient's husband, last day of radiation is 09/21/2016. 5.  Continue Septra DS 1 tablet every Monday, Wednesday, Friday for PCP prophylaxis.  Continue post completion of concurrent radiation and Temodar until resolution of lymphopenia 6.  Anticipate follow-up imaging at [facility] after completion of radiation and Temodar.  After a 4 week break, anticipate up to 6 cycles of Temodar (150-249m/m2 daily x 5 every 28 days).  First cycle 150 mg/m2 and if counts tolerate, plan to increase to 200 mg/m2 with cycles 2-6.  Imaging will be performed every 3 months. 7.  RTC in 1 week for MD assessment and labs (CBC with diff, CMP).   MLequita Asal MD  09/14/2016, 3:33 PM

## 2016-09-14 NOTE — Progress Notes (Signed)
Patient states she is having sporadic right sided headaches, mostly in the morning when she first wakes up.  She takes tylenol for the headaches which seem to help her. She does not like taking the prescription pain medications.  Patient presents today in wheelchair, accompanied by her husband, Lennette Bihari.  Otherwise, offers no complaints. She keeps her eyes closed most of the time.

## 2016-09-15 ENCOUNTER — Ambulatory Visit: Payer: BLUE CROSS/BLUE SHIELD | Admitting: Physical Therapy

## 2016-09-15 ENCOUNTER — Ambulatory Visit
Admission: RE | Admit: 2016-09-15 | Discharge: 2016-09-15 | Disposition: A | Discharge status: Home / Self Care | Payer: BLUE CROSS/BLUE SHIELD | Source: Ambulatory Visit | Attending: Radiation Oncology | Admitting: Radiation Oncology

## 2016-09-15 DIAGNOSIS — C719 Malignant neoplasm of brain, unspecified: Secondary | ICD-10-CM | POA: Diagnosis not present

## 2016-09-16 ENCOUNTER — Ambulatory Visit: Admission: RE | Admit: 2016-09-16 | Payer: BLUE CROSS/BLUE SHIELD | Source: Ambulatory Visit

## 2016-09-16 ENCOUNTER — Encounter: Payer: Self-pay | Admitting: Physical Therapy

## 2016-09-16 ENCOUNTER — Ambulatory Visit: Payer: BLUE CROSS/BLUE SHIELD | Attending: Hematology and Oncology | Admitting: Physical Therapy

## 2016-09-16 ENCOUNTER — Ambulatory Visit: Payer: BLUE CROSS/BLUE SHIELD

## 2016-09-16 ENCOUNTER — Other Ambulatory Visit: Payer: Self-pay | Admitting: *Deleted

## 2016-09-16 DIAGNOSIS — M6281 Muscle weakness (generalized): Secondary | ICD-10-CM | POA: Diagnosis present

## 2016-09-16 DIAGNOSIS — R2681 Unsteadiness on feet: Secondary | ICD-10-CM | POA: Diagnosis not present

## 2016-09-16 NOTE — Therapy (Signed)
[place] MAIN [facility] SERVICES [address] [place], [place], [address] Phone: [phone]   Fax:  [phone]  Physical Therapy Treatment  Patient Details  Name: Jessica [name] MRN: [id number] Date of Birth: 06/14/1955 Referring Provider: Dr. Nolon Stalls  Encounter Date: 09/16/2016      PT End of Session - 09/16/16 1307    Visit Number 2   Number of Visits 9   Date for PT Re-Evaluation 10/27/16   PT Start Time 1300   PT Stop Time 1338   PT Time Calculation (min) 38 min   Equipment Utilized During Treatment Gait belt   Activity Tolerance Patient tolerated treatment well;No increased pain   Behavior During Therapy [facility] for tasks assessed/performed;Anxious;Flat affect      Past Medical History:  Diagnosis Date  . Asthma    Pt does not use inhaler   . Elevated cholesterol   . Glioblastoma ([place])   . History of chicken pox   . History of measles   . History of mumps     Past Surgical History:  Procedure Laterality Date  . CT Scan of head  2009   negative  . FOOT SURGERY    . LAPAROSCOPY     abdominal endometriosis  . TUBAL LIGATION  1984    There were no vitals filed for this visit.      Subjective Assessment - 09/16/16 1306    Subjective Patinet reports that she is going to another MD appointment after PT today.    Pertinent History Pt was diagnosed with glioblastoma in May 2018 and had surgery to remove brain tumor 07/02/16. Pt is now undergoing daily radiation treatments and a bout of chemo therapy with Temodar. She is unable to drive due to vision impairments and has noticed impairments in short term memory recently. Pt has a recent history of bilateral pressure ulcers in her gluteal area; she is being treated by wound care.    Limitations Sitting;Lifting;Standing;Walking;House hold activities;Writing;Reading   How long can you sit comfortably? 30 to an hour   How long can you stand comfortably? 20 min due to weakness    How long can you walk comfortably? 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 No/denies   Pain Score 0-No pain   Multiple Pain Sites No     Treatment:  Marching in standing x 10 x 2, cues for correct technique and positioning  Step ups to 2 inch step x 10 x 2bilaterally with tactile cues to increase ROM for better strengthening;  3 way hip x 10 x 2 BLE with YTB, mod VCs to increase step length for better balance;  Heel raises x 20 with cues for positioning to improve strengthening in ankles;  Leg press x 75  x 20 x 3 , with cues to slow down LE movement for better strengthening  Sit to stand x 10 x 2 , for safety and min VCs to increase forward lean for better transfer;  Seated hip abd / ER  x 20 reps BLE with RTB , cues for correct technique and positioning   Patient has slow movements with all the above exercises and needs CGA and max vc for continuing the exercises and correct technique and form.  PT Education - 09/16/16 1307    Education provided Yes   Education Details plan of care   Person(s) Educated Patient   Methods Explanation   Comprehension Verbalized understanding;Returned demonstration             PT Long Term Goals - 09/01/16 1620      PT LONG TERM GOAL #1   Title Patient (> 62 years old) will complete five times sit to stand test in < 15 seconds indicating an increased LE strength and improved balance.   Baseline 28 seconds indicating increased fall risk   Time 8   Period Weeks   Status New   Target Date 10/27/16     PT LONG TERM GOAL #2   Title Patient will increase 10 meter walk test to >1.47m/s as to improve gait speed for better community ambulation and to reduce fall risk.    Baseline 0.34m/s indicating pt needs intervention to reduce fall risk   Time 8   Period Weeks   Status New   Target Date 10/27/16     PT LONG TERM GOAL #3   Title Patient will increase BLE gross strength to 4+/5 as to improve functional strength for independent gait, increased standing tolerance and increased ADL ability.   Baseline 3 to 4- out of 5   Time 8   Period Weeks   Status New   Target Date 10/27/16     PT LONG TERM GOAL #4   Title Patient will deny any falls over past 4 weeks to demonstrate improved safety awareness at home and work.    Baseline 3 falls in last 6 months   Time 8   Period Weeks   Status New   Target Date 10/27/16     PT LONG TERM GOAL #5   Title Patient will be independent in floor transfers with 1 or 2 hands, in less than 5 min with moderate difficulty or less for increased safety with getting up off the floor for improved fall recovery.   Baseline Pt unable to transfer from floor without assist.   Time 8   Period Weeks   Status New   Target Date 10/27/16               Plan - 09/16/16 1308    Clinical Impression Statement Patient required min verbal cueing during 3 way hip exercise to correct posture and form. Patient demonstrates ability to perform strengthening exercises and dynamic standing balance exercises with no increase in pain but with moderate fatigue. Patient will continue to benefit from continued skilled therapy in order to improve static and  dynamic standing balance and increase strength in order to decrease falls   Rehab Potential Fair   Clinical Impairments Affecting Rehab Potential Decreased vision, age, fatigue    PT Frequency 1x / week   PT Duration 8 weeks   PT Treatment/Interventions ADLs/Self Care Home Management;Cryotherapy;Canalith Repostioning;Moist Heat;DME Instruction;Gait training;Neuromuscular re-education;Balance training;Therapeutic exercise;Therapeutic activities;Functional mobility training;Stair  training;Cognitive remediation;Patient/family education;Orthotic Fit/Training;Wheelchair mobility training;Manual techniques;Energy conservation;Passive range of motion;Scar mobilization;Vestibular;Visual/perceptual remediation/compensation   PT Next Visit Plan Advanced balance training and advancement of HEP; detailed vision assessment, cranial nerve assessment   PT Home Exercise Plan resisted seated march, LAQ, hip abd, and ankle pumps   Consulted and Agree with Plan of Care Patient      Patient will benefit from skilled therapeutic intervention in order to improve the following deficits and impairments:  Abnormal gait, Decreased activity tolerance, Decreased balance, Decreased knowledge of precautions,  Decreased endurance, Decreased coordination, Decreased cognition, Decreased knowledge of use of DME, Decreased mobility, Difficulty walking, Decreased strength, Decreased skin integrity, Impaired perceived functional ability, Impaired vision/preception, Improper body mechanics  Visit Diagnosis: Unsteadiness on feet  Muscle weakness (generalized)     Problem List Patient Active Problem List   Diagnosis Date Noted  . Thrush 09/13/2016  . Lymphopenia 09/13/2016  . Elevated bilirubin 09/13/2016  . 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 Decubitus ulcer 08/16/2016  . Glioblastoma ([place]) 07/14/2016  . Anxiety 02/21/2015  . Depression 02/21/2015  . 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 Vitamin D deficiency 10/15/2008  . Allergic rhinitis 03/19/2007  . Esophageal reflux 03/19/2007  . Family history of breast cancer 03/19/2007  . Hypercholesteremia 03/19/2007  . Insomnia 03/19/2007  . Menopausal symptom 03/19/2007    Alanson Puls, PT DPT 09/16/2016, 1:09 PM  [place] MAIN [facility] SERVICES [address] [place], [place], [address] Phone: [phone]   Fax:  [phone]  Name: EMPRESS NEWMANN MRN: [id number] Date of Birth:  Jun 30, 1955

## 2016-09-16 NOTE — Telephone Encounter (Signed)
Per Dr Mike Gip, she does not need a refill. Mr Garrod informed

## 2016-09-16 NOTE — Telephone Encounter (Signed)
She has 7 days of medicine left. She completes XRT on 09/23/16

## 2016-09-16 NOTE — Telephone Encounter (Signed)
  Please call patient to determine how many doses of Temodar are left.  Please call radiation oncology to determine last day of radiation.  M

## 2016-09-16 NOTE — Patient Instructions (Addendum)
Abduction: Side Leg Lift (Eccentric) - Side-Lying    Lie on side. Lift top leg slightly higher than shoulder level. Keep top leg straight with body, toes pointing forward. Slowly lower for 3-5 seconds. __10_ reps per set, __2_ sets per day, __7_ days per week. Add ___ lbs when you achieve ___ repetitions.  http://ecce.exer.us/63   Copyright  VHI. All rights reserved.  (Home) Extension: Caudal Unilateral Hip - Prone    Lean across table on elbows. Lift one leg. Do not lift it higher than trunk. Repeat _10___ times per set. Do ___2_ sets per session. Do _7___ sessions per week.  Copyright  VHI. All rights reserved.

## 2016-09-17 ENCOUNTER — Ambulatory Visit: Payer: BLUE CROSS/BLUE SHIELD | Admitting: Physical Therapy

## 2016-09-17 ENCOUNTER — Ambulatory Visit
Admission: RE | Admit: 2016-09-17 | Discharge: 2016-09-17 | Disposition: A | Discharge status: Home / Self Care | Payer: BLUE CROSS/BLUE SHIELD | Source: Ambulatory Visit | Attending: Radiation Oncology | Admitting: Radiation Oncology

## 2016-09-17 ENCOUNTER — Ambulatory Visit: Payer: BLUE CROSS/BLUE SHIELD

## 2016-09-17 DIAGNOSIS — C719 Malignant neoplasm of brain, unspecified: Secondary | ICD-10-CM | POA: Diagnosis not present

## 2016-09-18 ENCOUNTER — Ambulatory Visit
Admission: RE | Admit: 2016-09-18 | Discharge: 2016-09-18 | Disposition: A | Discharge status: Home / Self Care | Payer: BLUE CROSS/BLUE SHIELD | Source: Ambulatory Visit | Attending: Radiation Oncology | Admitting: Radiation Oncology

## 2016-09-18 ENCOUNTER — Ambulatory Visit: Payer: BLUE CROSS/BLUE SHIELD

## 2016-09-18 DIAGNOSIS — C719 Malignant neoplasm of brain, unspecified: Secondary | ICD-10-CM | POA: Diagnosis not present

## 2016-09-20 ENCOUNTER — Other Ambulatory Visit: Payer: Self-pay | Admitting: Hematology and Oncology

## 2016-09-20 DIAGNOSIS — Z7189 Other specified counseling: Secondary | ICD-10-CM | POA: Insufficient documentation

## 2016-09-20 NOTE — Progress Notes (Signed)
[place] [facility] day:  09/21/2016   Chief Complaint: Jessica [name] is a 61 y.o. female with a glioblastoma who is seen for assessment at the end of 6 weeks of concurrent Temodar and radiation.  HPI:  The patient was last seen in the medical oncology clinic on 09/14/2016.  At that time, she was doing well.  Thrush had resolved.  Her decubitus ulcer had healed.  She was beginning her 6th week of concurrent radiation and Temodar.  She had stable visual loss (left homonymous hemianopsia).  She continued Bactrim prophylaxis.   Patient states that she was unable to complete radiation treatment today. Today was supposed to be her final treatment.  She reports that she is having "drainage" (sinus).  She states, "I just could not lay down to do it". Patient reporting nasal congestion and reports that her ears are "stopped up". She is taking Claritin. She reports that she missed some treatments last week as well. Patient's husband reports that patient will be coming in tomorrow for radiation treatment and to be seen by Dr. Baruch Gouty.   Radiation treatments scheduled for Tuesday, Wednesday, and Thursday this week; she will complete radiation on Thursday (09/24/2016). She continues on her prescribed Temodar; she has 2 day supply of medication left.  She continues to take Decadron 25m daily. [facility] completed.  She continues on Septra prophylaxis.    Past Medical History:  Diagnosis Date  . Asthma    Pt does not use inhaler   . Elevated cholesterol   [place]   . History of chicken pox   . History of measles   . History of mumps     Past Surgical History:  Procedure Laterality Date  . CT Scan of head  2009   negative  . FOOT SURGERY    . LAPAROSCOPY     abdominal endometriosis  . TUBAL LIGATION  1984    Family History  Problem Relation Age of Onset  . Breast cancer Mother 668 .  Depression Mother   . Hyperlipidemia Mother   . Hypothyroidism Mother   . Hypertension Mother   . Diabetes Father   . Hypertension Father   . Hypertension Brother   . Hypertension Sister     Social History:  reports that she has never smoked. She has never used smokeless tobacco. She reports that she drinks alcohol. She reports that she does not use drugs.  She has 2 children (age 2372and 345.  She previously worked as a cScientist, water qualityat a bSet designer  [place]  The patient is accompanied by her husband, Jessica [name] today.  Allergies:  Allergies  Allergen Reactions  . Augmentin [Amoxicillin-Pot Clavulanate] Nausea And Vomiting  . Compazine  [Prochlorperazine]     swelling, SOB.  .Marland KitchenOxycodone Nausea Only    Current Medications: Current Outpatient Prescriptions  Medication Sig Dispense Refill  . acetaminophen (TYLENOL) 325 MG tablet Take 975 mg by mouth every 6 (six) hours as needed.    . ALPRAZolam (XANAX) 1 MG tablet Take 1 tablet (1 mg total) by mouth daily as needed for anxiety. 30 tablet 0  . atorvastatin (LIPITOR) 40 MG tablet TAKE 1 TABLET DAILY 90 tablet 4  . bacitracin 500 UNIT/GM ointment Apply 1 application topically daily.    .Marland Kitchendexamethasone (DECADRON) 4 MG tablet Take 1 tablet (4 mg total) by mouth daily.  50 tablet 0  . fluticasone (FLONASE) 50 MCG/ACT nasal spray SHAKE LIQUID AND USE 2 SPRAYS IN EACH NOSTRIL DAILY 16 g 5  . mometasone (NASONEX) 50 MCG/ACT nasal spray Place 2 sprays into the nose daily.    . montelukast (SINGULAIR) 10 MG tablet Take 10 mg by mouth at bedtime.    Marland Kitchen omeprazole (PRILOSEC) 20 MG capsule Take 1 capsule (20 mg total) by mouth daily. 90 capsule 3  . ondansetron (ZOFRAN-ODT) 8 MG disintegrating tablet Take 1 tablet (8 mg total) by mouth every 8 (eight) hours as needed for nausea or vomiting. 30 tablet 1  . solifenacin (VESICARE) 10 MG tablet Take by mouth daily.    Marland Kitchen sulfamethoxazole-trimethoprim (BACTRIM DS,SEPTRA DS) 800-160 MG tablet  Take 1 tablet by mouth 3 (three) times a week. 30 tablet 0  . temozolomide (TEMODAR) 100 MG capsule May take on an empty stomach or at bedtime to decrease nausea & vomiting. 42 capsule 0  . temozolomide (TEMODAR) 20 MG capsule Take 2 capsules (40 mg total) by mouth daily. May take on an empty stomach or at bedtime to decrease nausea & vomiting. 84 capsule 0  . buPROPion (WELLBUTRIN XL) 300 MG 24 hr tablet Take 1 tablet (300 mg total) by mouth daily. (Patient not taking: Reported on 07/27/2016) 90 tablet 3  . busPIRone (BUSPAR) 5 MG tablet Take 1 tablet (5 mg total) by mouth 3 (three) times daily. 270 tablet 3  . HYDROcodone-homatropine (HYCODAN) 5-1.5 MG/5ML syrup 5 ml 4-6 hours as needed for cough (Patient not taking: Reported on 07/07/2016) 240 mL 0  . levETIRAcetam (KEPPRA) 1000 MG tablet Take 1,000 mg by mouth See admin instructions. tk 1021m bid x 10 days, if no seizures then decrease to 5057mbid for 10 days, then if no seizures may stop    . oxyCODONE (OXY IR/ROXICODONE) 5 MG immediate release tablet Take 5 mg by mouth every 6 (six) hours as needed.    . polyethylene glycol (MIRALAX / GLYCOLAX) packet Take 17 g by mouth daily as needed.    . potassium chloride SA (K-DUR,KLOR-CON) 20 MEQ tablet Take 1 tablet (20 mEq total) by mouth daily. (Patient not taking: Reported on 09/21/2016) 7 tablet 0  . sennosides-docusate sodium (SENOKOT-S) 8.6-50 MG tablet Take 2 tablets by mouth 2 (two) times daily.    . sertraline (ZOLOFT) 25 MG tablet Take by mouth.    . traZODone (DESYREL) 50 MG tablet Take 50-100 mg by mouth at bedtime.    . Marland Kitchenolpidem (AMBIEN) 5 MG tablet TAKE 1 TABLET BY MOUTH EVERY NIGHT AT BEDTIME AS NEEDED FOR INSOMNIA (Patient not taking: Reported on 07/07/2016) 30 tablet 5   No current facility-administered medications for this visit.     Review of Systems:  GENERAL:  Feels "good".  No fevers or sweats. No new weight.  Previously weighed 170-180 pounds. PERFORMANCE STATUS (ECOG):   2-3 HEENT:  Left sided vision loss (no change).  Nasal congestion. Ears "stopped up". No sore throat, mouth sores or tenderness. Lungs: No shortness of breath or cough.  No hemoptysis. Cardiac:  No chest pain, palpitations, orthopnea, or PND. GI:  No nausea, vomiting, diarrhea, constipation, melena or hematochezia.  Pain pills cause emesis. GU:  No urgency, frequency, dysuria, or hematuria. Musculoskeletal:  No back pain.  No joint pain.  No muscle tenderness. Extremities:  Legs weak.  No pain or swelling. Skin:  Decubitus ulcer, resolved.  No other rashes or skin changes. Neuro:  Left homonymous hemianopsia.  No  headache, focal numbness, balance or coordination issues. Endocrine:  No diabetes, thyroid issues, hot flashes or night sweats. Psych:  No mood changes, depression or anxiety. Pain:  No focal pain. Review of systems:  All other systems reviewed and found to be negative.  Physical Exam: Blood pressure 128/85, pulse 99, temperature (!) 97.3 F (36.3 C), temperature source Tympanic, resp. rate 18, weight 176 lb (79.8 kg). GENERAL:  Well developed, well nourished, woman sitting comfortably in the exam room in no acute distress.  She requires a 2 person assist onto the exam table.   MENTAL STATUS:  Alert and oriented to person, place and time. HEAD:  Wearing a light turquoise hat.  Shoulder length light brown hair.  Cushingoid features. Normocephalic, atraumatic, and symmetric.  EYES:  Glasses.  Blue eyes.  Pupils equal round and reactive to light and accomodation.  No conjunctivitis or scleral icterus. ENT:  No oral lesions. Thrush.  Mucous membranes moist.  RESPIRATORY:  Clear to auscultation without rales, wheezes or rhonchi. CARDIOVASCULAR:  Regular rate and rhythm without murmur, rub or gallop. ABDOMEN:  Soft, non-tender, with active bowel sounds, and no hepatosplenomegaly.  No masses. SKIN:  No rashes or ulcers.  EXTREMITIES: No edema, no skin discoloration or tenderness.  No  palpable cords. LYMPH NODES: No palpable cervical, supraclavicular, axillary or inguinal adenopathy  NEUROLOGICAL: Stable.  Lower extremity strength 4+/5.  Sensation intact.  No improvement in visual symptoms (left homonymous hemianopsia). PSYCH:  Appropriate.   Appointment on 09/21/2016  Component Date Value Ref Range Status  . WBC 09/21/2016 11.2* 3.6 - 11.0 K/uL Final  . RBC 09/21/2016 4.34  3.80 - 5.20 MIL/uL Final  . Hemoglobin 09/21/2016 14.4  12.0 - 16.0 g/dL Final  . HCT 09/21/2016 41.5  35.0 - 47.0 % Final  . MCV 09/21/2016 95.7  80.0 - 100.0 fL Final  . MCH 09/21/2016 33.1  26.0 - 34.0 pg Final  . MCHC 09/21/2016 34.6  32.0 - 36.0 g/dL Final  . RDW 09/21/2016 17.7* 11.5 - 14.5 % Final  . Platelets 09/21/2016 252  150 - 440 K/uL Final  . Neutrophils Relative % 09/21/2016 94  % Final  . Neutro Abs 09/21/2016 10.5* 1.4 - 6.5 K/uL Final  . Lymphocytes Relative 09/21/2016 2  % Final  . Lymphs Abs 09/21/2016 0.2* 1.0 - 3.6 K/uL Final  . Monocytes Relative 09/21/2016 4  % Final  . Monocytes Absolute 09/21/2016 0.4  0.2 - 0.9 K/uL Final  . Eosinophils Relative 09/21/2016 0  % Final  . Eosinophils Absolute 09/21/2016 0.0  0 - 0.7 K/uL Final  . Basophils Relative 09/21/2016 0  % Final  . Basophils Absolute 09/21/2016 0.0  0 - 0.1 K/uL Final  . Sodium 09/21/2016 133* 135 - 145 mmol/L Final  . Potassium 09/21/2016 4.4  3.5 - 5.1 mmol/L Final  . Chloride 09/21/2016 98* 101 - 111 mmol/L Final  . CO2 09/21/2016 23  22 - 32 mmol/L Final  . Glucose, Bld 09/21/2016 154* 65 - 99 mg/dL Final  . BUN 09/21/2016 14  6 - 20 mg/dL Final  . Creatinine, Ser 09/21/2016 0.79  0.44 - 1.00 mg/dL Final  . Calcium 09/21/2016 8.8* 8.9 - 10.3 mg/dL Final  . Total Protein 09/21/2016 6.8  6.5 - 8.1 g/dL Final  . Albumin 09/21/2016 4.0  3.5 - 5.0 g/dL Final  . AST 09/21/2016 19  15 - 41 U/L Final  . ALT 09/21/2016 36  14 - 54 U/L Final  . Alkaline Phosphatase  09/21/2016 60  38 - 126 U/L Final  . Total  Bilirubin 09/21/2016 1.2  0.3 - 1.2 mg/dL Final  . GFR calc non Af Amer 09/21/2016 >60  >60 mL/min Final  . GFR calc Af Amer 09/21/2016 >60  >60 mL/min Final   Comment: (NOTE) The eGFR has been calculated using the CKD EPI equation. This calculation has not been validated in all clinical situations. eGFR's persistently <60 mL/min signify possible Chronic Kidney Disease.   . Anion gap 09/21/2016 12  5 - 15 Final    Assessment:  Jessica [name] is a 61 y.o. 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    Head MRI at [facility] on 07/01/2016 revealed a 5.7 x 4.5 cm heterogeneously enhancing T2 hyperintense lesion in the right is posterior medial temporal lobe with extension into the adjacent right parietal and occipital lobes.  There was a separate enhancing component along the ependymal surface at the body of the right lateral ventricle extending into the centrum semiovale. There was marked mass effect on the atrium and occipital horn of the right lateral ventricle with slight increase in dilation of the temporal horn of the right lateral ventricle, consistent with at least partial entrapment. There was stable dilation of the left lateral ventricle.  There was no hemorrhage or large acute cortical infarction.   Head MRI at [facility] on 07/02/2016 revealed substantial resection of the mass in the right cerebral hemisphere with residual disease disease. There was T2/FLAIR signal hyperintensity along the margins of the resection cavity. There was focal masslike T2/FLAIR signal hyperintensity and enhancement in the right frontal periventricular white matter, and within the splenium of the corpus callosum compatible with residual disease. There was enhancement along the margins of the resection cavity, which appeared nodular at the posterolateral aspect.  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  She began cranial radiation on 08/06/2016.  She is  currently completing week #6 Temodar (began on 08/10/2016).  She has 3 fractions of radiation left (2 doses of Temodar left).  She is on Bactrim prophylaxis.  She is on Decadron 4 mg a day.  Symptomatically, she has stable visual loss (left homonymous hemianopsia) secondary to right occipital lobe tumor and infarct.  She needs assistance with her ADLs.  Exam is stable. CBC and CMP are unremarkable.   Plan: 1.  Labs today:  CBC with diff, CMP. 2.  Complete radiation cycles as scheduled this week. Planned to complete cycle on Thursday 09/24/2016. 3.  Continue Temodar 140 mg daily while on radiation. Dosing to be changed 4 weeks after radiation completed. Encouraged patient's husband to address Decadron taper when they see Dr. Baruch Gouty tomorrow.   4.  Continue Decadron 4 mg a day.  Taper per radiation therapy. 5.  Continue Septra DS 1 tablet every Monday, Wednesday, Friday for PCP prophylaxis.  Continue post completion of concurrent radiation and Temodar until resolution of lymphopenia. 6.  Anticipate follow-up imaging at [facility] after completion of radiation and Temodar.  After a 4 week break, anticipate up to 6 cycles of Temodar (150-218m/m2 daily x 5 every 28 days).  First cycle 150 mg/m2 and if counts tolerate, plan to increase to 200 mg/m2 with cycles 2-6.  Imaging will be performed every 3 months. 7.  RTC in 3 weeks for MD assessment and labs (CBC with diff, CMP).   Melissa C. CMike Gip MD  09/21/2016, 3:49 PM

## 2016-09-21 ENCOUNTER — Encounter: Payer: Self-pay | Admitting: Hematology and Oncology

## 2016-09-21 ENCOUNTER — Ambulatory Visit: Admission: RE | Admit: 2016-09-21 | Payer: BLUE CROSS/BLUE SHIELD | Source: Ambulatory Visit

## 2016-09-21 ENCOUNTER — Inpatient Hospital Stay (HOSPITAL_BASED_OUTPATIENT_CLINIC_OR_DEPARTMENT_OTHER): Payer: BLUE CROSS/BLUE SHIELD | Admitting: Hematology and Oncology

## 2016-09-21 ENCOUNTER — Inpatient Hospital Stay: Payer: BLUE CROSS/BLUE SHIELD

## 2016-09-21 ENCOUNTER — Ambulatory Visit: Payer: BLUE CROSS/BLUE SHIELD

## 2016-09-21 VITALS — BP 128/85 | HR 99 | Temp 97.3°F | Resp 18 | Wt 176.0 lb

## 2016-09-21 DIAGNOSIS — R51 Headache: Secondary | ICD-10-CM

## 2016-09-21 DIAGNOSIS — B379 Candidiasis, unspecified: Secondary | ICD-10-CM

## 2016-09-21 DIAGNOSIS — Z803 Family history of malignant neoplasm of breast: Secondary | ICD-10-CM

## 2016-09-21 DIAGNOSIS — C719 Malignant neoplasm of brain, unspecified: Secondary | ICD-10-CM

## 2016-09-21 DIAGNOSIS — E7800 Pure hypercholesterolemia, unspecified: Secondary | ICD-10-CM | POA: Diagnosis not present

## 2016-09-21 DIAGNOSIS — J45909 Unspecified asthma, uncomplicated: Secondary | ICD-10-CM

## 2016-09-21 DIAGNOSIS — L89309 Pressure ulcer of unspecified buttock, unspecified stage: Secondary | ICD-10-CM | POA: Diagnosis not present

## 2016-09-21 DIAGNOSIS — Z923 Personal history of irradiation: Secondary | ICD-10-CM

## 2016-09-21 DIAGNOSIS — C718 Malignant neoplasm of overlapping sites of brain: Secondary | ICD-10-CM | POA: Diagnosis not present

## 2016-09-21 DIAGNOSIS — Z85841 Personal history of malignant neoplasm of brain: Secondary | ICD-10-CM | POA: Diagnosis not present

## 2016-09-21 DIAGNOSIS — H5462 Unqualified visual loss, left eye, normal vision right eye: Secondary | ICD-10-CM | POA: Diagnosis not present

## 2016-09-21 DIAGNOSIS — H53462 Homonymous bilateral field defects, left side: Secondary | ICD-10-CM

## 2016-09-21 DIAGNOSIS — D72810 Lymphocytopenia: Secondary | ICD-10-CM

## 2016-09-21 DIAGNOSIS — Z8619 Personal history of other infectious and parasitic diseases: Secondary | ICD-10-CM | POA: Diagnosis not present

## 2016-09-21 DIAGNOSIS — Z7189 Other specified counseling: Secondary | ICD-10-CM

## 2016-09-21 LAB — CBC WITH DIFFERENTIAL/PLATELET
Basophils Absolute: 0 10*3/uL (ref 0–0.1)
Basophils Relative: 0 %
Eosinophils Absolute: 0 10*3/uL (ref 0–0.7)
Eosinophils Relative: 0 %
HCT: 41.5 % (ref 35.0–47.0)
Hemoglobin: 14.4 g/dL (ref 12.0–16.0)
Lymphocytes Relative: 2 %
Lymphs Abs: 0.2 10*3/uL — ABNORMAL LOW (ref 1.0–3.6)
MCH: 33.1 pg (ref 26.0–34.0)
MCHC: 34.6 g/dL (ref 32.0–36.0)
MCV: 95.7 fL (ref 80.0–100.0)
Monocytes Absolute: 0.4 10*3/uL (ref 0.2–0.9)
Monocytes Relative: 4 %
Neutro Abs: 10.5 10*3/uL — ABNORMAL HIGH (ref 1.4–6.5)
Neutrophils Relative %: 94 %
Platelets: 252 10*3/uL (ref 150–440)
RBC: 4.34 MIL/uL (ref 3.80–5.20)
RDW: 17.7 % — ABNORMAL HIGH (ref 11.5–14.5)
WBC: 11.2 10*3/uL — ABNORMAL HIGH (ref 3.6–11.0)

## 2016-09-21 LAB — COMPREHENSIVE METABOLIC PANEL
ALT: 36 U/L (ref 14–54)
AST: 19 U/L (ref 15–41)
Albumin: 4 g/dL (ref 3.5–5.0)
Alkaline Phosphatase: 60 U/L (ref 38–126)
Anion gap: 12 (ref 5–15)
BUN: 14 mg/dL (ref 6–20)
CO2: 23 mmol/L (ref 22–32)
Calcium: 8.8 mg/dL — ABNORMAL LOW (ref 8.9–10.3)
Chloride: 98 mmol/L — ABNORMAL LOW (ref 101–111)
Creatinine, Ser: 0.79 mg/dL (ref 0.44–1.00)
GFR calc Af Amer: >60 mL/min (ref >60)
GFR calc non Af Amer: >60 mL/min (ref >60)
Glucose, Bld: 154 mg/dL — ABNORMAL HIGH (ref 65–99)
Potassium: 4.4 mmol/L (ref 3.5–5.1)
Sodium: 133 mmol/L — ABNORMAL LOW (ref 135–145)
Total Bilirubin: 1.2 mg/dL (ref 0.3–1.2)
Total Protein: 6.8 g/dL (ref 6.5–8.1)

## 2016-09-21 NOTE — Progress Notes (Signed)
Patient is here for follow up, she is doing well. She has no major complaints today

## 2016-09-22 ENCOUNTER — Ambulatory Visit: Payer: BLUE CROSS/BLUE SHIELD | Admitting: Physical Therapy

## 2016-09-22 ENCOUNTER — Ambulatory Visit: Payer: BLUE CROSS/BLUE SHIELD

## 2016-09-22 ENCOUNTER — Ambulatory Visit
Admission: RE | Admit: 2016-09-22 | Discharge: 2016-09-22 | Disposition: A | Discharge status: Home / Self Care | Payer: BLUE CROSS/BLUE SHIELD | Source: Ambulatory Visit | Attending: Radiation Oncology | Admitting: Radiation Oncology

## 2016-09-22 ENCOUNTER — Other Ambulatory Visit: Payer: Self-pay | Admitting: *Deleted

## 2016-09-22 DIAGNOSIS — C719 Malignant neoplasm of brain, unspecified: Secondary | ICD-10-CM | POA: Diagnosis not present

## 2016-09-22 MED ORDER — FLUCONAZOLE 100 MG PO TABS: 100.0000 mg | ORAL_TABLET | Freq: Every day | ORAL | 0 refills | Status: DC

## 2016-09-23 ENCOUNTER — Ambulatory Visit: Payer: BLUE CROSS/BLUE SHIELD

## 2016-09-23 ENCOUNTER — Ambulatory Visit
Admission: RE | Admit: 2016-09-23 | Discharge: 2016-09-23 | Disposition: A | Discharge status: Home / Self Care | Payer: BLUE CROSS/BLUE SHIELD | Source: Ambulatory Visit | Attending: Radiation Oncology | Admitting: Radiation Oncology

## 2016-09-23 DIAGNOSIS — C719 Malignant neoplasm of brain, unspecified: Secondary | ICD-10-CM | POA: Diagnosis not present

## 2016-09-24 ENCOUNTER — Ambulatory Visit: Payer: BLUE CROSS/BLUE SHIELD | Admitting: Physical Therapy

## 2016-09-24 ENCOUNTER — Ambulatory Visit: Payer: BLUE CROSS/BLUE SHIELD

## 2016-09-24 ENCOUNTER — Telehealth: Payer: Self-pay | Admitting: Urgent Care

## 2016-09-24 ENCOUNTER — Ambulatory Visit
Admission: RE | Admit: 2016-09-24 | Discharge: 2016-09-24 | Disposition: A | Discharge status: Home / Self Care | Payer: BLUE CROSS/BLUE SHIELD | Source: Ambulatory Visit | Attending: Radiation Oncology | Admitting: Radiation Oncology

## 2016-09-24 DIAGNOSIS — C719 Malignant neoplasm of brain, unspecified: Secondary | ICD-10-CM | POA: Diagnosis not present

## 2016-09-24 NOTE — Telephone Encounter (Signed)
Confirmed radiation completion date with Maudie Mercury, RN. Patient now scheduled to complete tomorrow (09/25/2016). Patient has not taken Temodar today due to her being out of medication. Patient needs two 140mg  doses. We have spoken with Alyson in pharmacy, who was able to secure a supply from [facility]. Bottle labeled by pharmacy and was provided to patient by this NP. Patient educated on the medication provided, as what we are providing is different from what she has been taking. Husband verbalized understanding that patient only needs to take one 140mg  capsule. Patient to keep the remaining capsules with anticipation of needing it after [facility] imaging completed. Patient advised to return a call to the clinic with any questions or further concerns.

## 2016-09-25 ENCOUNTER — Ambulatory Visit
Admission: RE | Admit: 2016-09-25 | Discharge: 2016-09-25 | Disposition: A | Discharge status: Home / Self Care | Payer: BLUE CROSS/BLUE SHIELD | Source: Ambulatory Visit | Attending: Radiation Oncology | Admitting: Radiation Oncology

## 2016-09-25 DIAGNOSIS — C719 Malignant neoplasm of brain, unspecified: Secondary | ICD-10-CM | POA: Diagnosis not present

## 2016-09-29 ENCOUNTER — Encounter: Payer: Self-pay | Admitting: Physical Therapy

## 2016-09-29 ENCOUNTER — Ambulatory Visit: Payer: BLUE CROSS/BLUE SHIELD | Admitting: Physical Therapy

## 2016-09-29 DIAGNOSIS — M6281 Muscle weakness (generalized): Secondary | ICD-10-CM

## 2016-09-29 DIAGNOSIS — R2681 Unsteadiness on feet: Secondary | ICD-10-CM | POA: Diagnosis not present

## 2016-09-29 NOTE — Therapy (Addendum)
[place] MAIN [facility] SERVICES [address] [place], [place], [address] Phone: [phone]   Fax:  [phone]  Physical Therapy Treatment  Patient Details  Name: Jessica [name] MRN: [id number] Date of Birth: [birth year] Referring Provider: Dr. Nolon Stalls  Encounter Date: 09/29/2016      PT End of Session - 09/29/16 1526    Visit Number 3   Number of Visits 9   Date for PT Re-Evaluation 10/27/16   PT Start Time 0315   PT Stop Time 0355   PT Time Calculation (min) 40 min   Equipment Utilized During Treatment Gait belt   Activity Tolerance Patient tolerated treatment well;No increased pain   Behavior During Therapy [facility] for tasks assessed/performed;Anxious;Flat affect      Past Medical History:  Diagnosis Date  . Asthma    Pt does not use inhaler   . Elevated cholesterol   . Glioblastoma ([place])   . History of chicken pox   . History of measles   . History of mumps     Past Surgical History:  Procedure Laterality Date  . CT Scan of head  2009   negative  . FOOT SURGERY    . LAPAROSCOPY     abdominal endometriosis  . TUBAL LIGATION  1984    There were no vitals filed for this visit.      Subjective Assessment - 09/29/16 1525    Subjective Patient reports that she is doing ok, nothing new and no new complaints.   Pertinent History Pt was diagnosed with glioblastoma in May 2018 and had surgery to remove brain tumor 07/02/16. Pt is now undergoing daily radiation treatments and a bout of chemo therapy with Temodar. She is unable to drive due to vision impairments and has noticed impairments in short term memory recently. Pt has a recent history of bilateral pressure ulcers in her gluteal area; she is being treated by wound care.    Limitations Sitting;Lifting;Standing;Walking;House hold activities;Writing;Reading   How long can you sit comfortably? 30 to an hour   How long can you stand comfortably? 20 min due to weakness   How long can you walk comfortably? 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 No/denies   Pain Score 0-No pain      Treatment:  Seated marching  x 10  cues for correct technique and positioning  Step ups to 2 inch step x 10  bilaterally with tactile cues to increase ROM for better strengthening;  Side stepping x 10  in parallel bars  , mod VCs to increase step length for better balance;  Heel raises x 20 with cues for positioning to improve strengthening in ankles;  Leg press x 75  x  10 x 2, with cues to slow down LE movement for better strengthening  Sit to stand x 5  x 2 , for safety and min VCs to increase forward lean for better transfer;  Seated LAQ  x 20 reps BLE with RTB , cues for correct technique and positioning  Outcome measures performed and goals reviewed.   Patient has slow movements with all the above exercises and needs CGA and max vc for continuing the exercises and correct technique and form.  PT Education - 09/29/16 1526    Education provided Yes   Education Details exercise technique   Person(s) Educated Patient   Methods Explanation;Demonstration   Comprehension Verbalized understanding;Returned demonstration             PT Long Term Goals - 09/29/16 1537      PT LONG TERM GOAL #1   Title Patient (> 63 years old) will complete five times sit to stand test in < 15 seconds indicating an increased LE strength and improved balance.   Baseline 28 seconds indicating increased fall risk, 39.71 sec   Time 8   Period Weeks   Status On-going     PT LONG TERM GOAL #2   Title Patient will increase 10 meter walk test to >1.51m/s as to improve gait speed for better community  ambulation and to reduce fall risk.   Baseline 0.52m/s indicating pt needs intervention to reduce fall risk, .39 m / sec 09/29/16   Time 8   Period Weeks   Status On-going     PT LONG TERM GOAL #3   Title Patient will increase BLE gross strength to 4+/5 as to improve functional strength for independent gait, increased standing tolerance and increased ADL ability.   Baseline 3 to 4- out of 5, 3/5 BLE 8/ 28/18   Time 8   Period Weeks   Status On-going     PT LONG TERM GOAL #4   Title Patient will deny any falls over past 4 weeks to demonstrate improved safety awareness at home and work.    Baseline 3 falls in last 6 months, several falls in the last 3 days; falls 09/29/16   Time 8   Period Weeks   Status On-going     PT LONG TERM GOAL #5   Title Patient will be independent in floor transfers with 1 or 2 hands, in less than 5 min with moderate difficulty or less for increased safety with getting up off the floor for improved fall recovery.   Baseline Pt unable to transfer from floor without assist., unable to get up from the floor   Time 8   Period Weeks   Status On-going               Plan - 09/29/16 1526    Clinical Impression Statement Pt requires redirection and verbal cues for correct performance of exercises. She has decreased motor planning and needs constant cuing to complete the task. She did not make progress towards her goals and participated in one treatment over the past month.  Patient struggles with speed during movement as well as balance with unstable surfaces. Pt encouraged to continue HEP .Follow-up as scheduled.   Rehab Potential Fair   Clinical Impairments Affecting Rehab Potential Decreased vision, age, fatigue    PT Frequency 1x / week   PT Duration 8 weeks   PT Treatment/Interventions ADLs/Self Care Home Management;Cryotherapy;Canalith Repostioning;Moist Heat;DME Instruction;Gait training;Neuromuscular re-education;Balance training;Therapeutic  exercise;Therapeutic activities;Functional mobility training;Stair training;Cognitive remediation;Patient/family education;Orthotic Fit/Training;Wheelchair mobility training;Manual techniques;Energy conservation;Passive range of motion;Scar mobilization;Vestibular;Visual/perceptual remediation/compensation   PT Next Visit Plan Advanced balance training and advancement of HEP; detailed vision assessment, cranial nerve assessment   PT Home Exercise Plan resisted seated march, LAQ, hip abd, and ankle pumps   Consulted and Agree with Plan of Care Patient      Patient will benefit from skilled therapeutic intervention in order to improve the following deficits and impairments:  Abnormal gait, Decreased activity tolerance, Decreased balance, Decreased knowledge of  precautions, Decreased endurance, Decreased coordination, Decreased cognition, Decreased knowledge of use of DME, Decreased mobility, Difficulty walking, Decreased strength, Decreased skin integrity, Impaired perceived functional ability, Impaired vision/preception, Improper body mechanics  Visit Diagnosis: Unsteadiness on feet  Muscle weakness (generalized)     Problem List Patient Active Problem List   Diagnosis Date Noted  . Goals of care, counseling/discussion 09/20/2016  . Thrush 09/13/2016  . Lymphopenia 09/13/2016  . Elevated bilirubin 09/13/2016  . 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 Decubitus ulcer 08/16/2016  . Glioblastoma ([place]) 07/14/2016  . Anxiety 02/21/2015  . Depression 02/21/2015  . 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 Vitamin D deficiency 10/15/2008  . Allergic rhinitis 03/19/2007  . Esophageal reflux 03/19/2007  . Family history of breast cancer 03/19/2007  . Hypercholesteremia 03/19/2007  . Insomnia 03/19/2007  . Menopausal symptom 03/19/2007    Alanson Puls, PT DPT 09/29/2016, 4:45 PM  [place] MAIN [facility] SERVICES [address] [place],  [place], [address] Phone: [phone]   Fax:  [phone]  Name: Jessica [name] MRN: [id number] Date of Birth: 13-Nov-1955

## 2016-10-01 ENCOUNTER — Ambulatory Visit: Payer: BLUE CROSS/BLUE SHIELD | Admitting: Physical Therapy

## 2016-10-06 ENCOUNTER — Encounter: Payer: Self-pay | Admitting: Physical Therapy

## 2016-10-06 ENCOUNTER — Ambulatory Visit: Payer: BLUE CROSS/BLUE SHIELD | Admitting: Physical Therapy

## 2016-10-06 ENCOUNTER — Ambulatory Visit: Payer: BLUE CROSS/BLUE SHIELD | Attending: Hematology and Oncology | Admitting: Physical Therapy

## 2016-10-06 DIAGNOSIS — M6281 Muscle weakness (generalized): Secondary | ICD-10-CM | POA: Diagnosis present

## 2016-10-06 DIAGNOSIS — R2681 Unsteadiness on feet: Secondary | ICD-10-CM | POA: Insufficient documentation

## 2016-10-06 NOTE — Therapy (Signed)
[place] MAIN [facility] SERVICES [address] [place], [place], [address] Phone: [phone]   Fax:  [phone]  Physical Therapy Treatment  Patient Details  Name: Jessica [name] MRN: [id number] Date of Birth: 05/14/1961 Referring Provider: Dr. Nolon Stalls  Encounter Date: 10/06/2016      PT End of Session - 10/06/16 1446    Visit Number 4   Number of Visits 9   Date for PT Re-Evaluation 09/62/18   PT Start Time 0232   PT Stop Time 0622   PT Time Calculation (min) 43 min   Equipment Utilized During Treatment Gait belt   Activity Tolerance Patient tolerated treatment well;No increased pain   Behavior During Therapy [facility] for tasks assessed/performed;Anxious;Flat affect      Past Medical History:  Diagnosis Date  . Asthma    Pt does not use inhaler   . Elevated cholesterol   . Glioblastoma ([place])   . History of chicken pox   . History of measles   . History of mumps     Past Surgical History:  Procedure Laterality Date  . CT Scan of head  2009   negative  . FOOT SURGERY    . LAPAROSCOPY     abdominal endometriosis  . TUBAL LIGATION  1984    There were no vitals filed for this visit.      Subjective Assessment - 10/06/16 1443    Subjective Patient reports that she is doing ok, nothing new and no new complaints. Patient states that she is walking some around home using her walker and/or single point cane.  Patient states that she continues to have falls, especially at night.   Pertinent History Pt was diagnosed with glioblastoma in May 2018 and had surgery to remove brain tumor 07/02/16. Pt is now undergoing daily radiation treatments and a bout of chemo therapy with Temodar. She is unable to drive due to vision impairments and has noticed impairments in short term memory recently. Pt has a recent history of bilateral pressure ulcers in her gluteal area; she is being treated by wound care.    Limitations  Sitting;Lifting;Standing;Walking;House hold activities;Writing;Reading   How long can you sit comfortably? 30 to an hour   How long can you stand comfortably? 20 min due to weakness   How long can you walk comfortably? 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 No/denies   Pain Score 0-No pain   Multiple Pain Sites No       Treatment:  Standing marching  x 10  cues for correct technique and positioning  Step ups to 4 inch step x 10  bilaterally with tactile cues to increase ROM for better strengthening;  Side stepping x 1 lap 10 feet with YTB  in parallel bars  , mod VCs to increase step length for better balance;  Heel raises x 20 with cues for positioning to improve strengthening in ankles;  Leg press x 75 x  10 x 2, with cues to slow down LE movement for better strengthening  Standing hip abd, hip extension with YTB   Patient requires mod-max verbal and visual cues in order to complete exercises today.  Patient easily distracted during session.   Patient has slow movements  with all the above exercises and needs CGA and max vc for continuing the exercises and correct technique and form                           PT Education - 10/06/16 1445    Education provided Yes   Education Details safety with transfers   Person(s) Educated Patient   Methods Explanation;Demonstration   Comprehension Verbalized understanding             PT Long Term Goals - 09/29/16 1537      PT LONG TERM GOAL #1   Title Patient (> 30 years old) will complete five times sit to stand test in < 15 seconds indicating an increased LE strength and improved balance.   Baseline 62 seconds indicating increased fall risk, 39.71 sec   Time 8   Period Weeks    Status On-going     PT LONG TERM GOAL #2   Title Patient will increase 10 meter walk test to >1.34m/s as to improve gait speed for better community ambulation and to reduce fall risk.   Baseline 0.32m/s indicating pt needs intervention to reduce fall risk, .39 m / sec 09/29/16   Time 8   Period Weeks   Status On-going     PT LONG TERM GOAL #3   Title Patient will increase BLE gross strength to 4+/5 as to improve functional strength for independent gait, increased standing tolerance and increased ADL ability.   Baseline 3 to 4- out of 5, 3/5 BLE 8/ 28/18   Time 8   Period Weeks   Status On-going     PT LONG TERM GOAL #4   Title Patient will deny any falls over past 4 weeks to demonstrate improved safety awareness at home and work.    Baseline 3 falls in last 6 months, several falls in the last 3 days; falls 09/29/16   Time 8   Period Weeks   Status On-going     PT LONG TERM GOAL #5   Title Patient will be independent in floor transfers with 1 or 2 hands, in less than 5 min with moderate difficulty or less for increased safety with getting up off the floor for improved fall recovery.   Baseline Pt unable to transfer from floor without assist., unable to get up from the floor   Time 8   Period Weeks   Status On-going             Patient will benefit from skilled therapeutic intervention in order to improve the following deficits and impairments:     Visit Diagnosis: Unsteadiness on feet  Muscle weakness (generalized)     Problem List Patient Active Problem List   Diagnosis Date Noted  . Goals of care, counseling/discussion 09/20/2016  . Thrush 09/13/2016  . Lymphopenia 09/13/2016  . Elevated bilirubin 09/13/2016  . 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 Decubitus ulcer 08/16/2016  . Glioblastoma ([place]) 07/14/2016  . Anxiety 02/21/2015  . Depression 02/21/2015  . 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 Vitamin D deficiency 10/15/2008  . Allergic rhinitis  03/19/2007  . Esophageal reflux 03/19/2007  . Family history of breast cancer 03/19/2007  . Hypercholesteremia 03/19/2007  . Insomnia 03/19/2007  . Menopausal symptom 03/19/2007    Alanson Puls, PT DPT 10/06/2016, 3:26 PM  [place] MAIN [facility] SERVICES [address] [place], [place], [address] Phone: [phone]   Fax:  [phone]  Name: JENNY LAI MRN: [id number] Date of Birth: 1955-08-01

## 2016-10-07 ENCOUNTER — Other Ambulatory Visit: Payer: Self-pay | Admitting: *Deleted

## 2016-10-07 ENCOUNTER — Telehealth: Payer: Self-pay | Admitting: *Deleted

## 2016-10-07 ENCOUNTER — Other Ambulatory Visit: Payer: Self-pay | Admitting: Radiation Oncology

## 2016-10-07 NOTE — Telephone Encounter (Signed)
Jessica [name] called to report that she is disoriented and is up all night wandering the house. Asking for something to be done. States she completed XRT and chemo a week ago. Please advise

## 2016-10-07 NOTE — Telephone Encounter (Signed)
done

## 2016-10-07 NOTE — Telephone Encounter (Signed)
Called patient's husband and discussed symptoms of confusion with him and instructed him to take her to the ED for the confusion and a scan.  Voiced understanding.

## 2016-10-08 ENCOUNTER — Ambulatory Visit: Payer: BLUE CROSS/BLUE SHIELD | Admitting: Physical Therapy

## 2016-10-12 ENCOUNTER — Other Ambulatory Visit: Payer: Self-pay | Admitting: Hematology and Oncology

## 2016-10-12 ENCOUNTER — Other Ambulatory Visit: Payer: Self-pay | Admitting: *Deleted

## 2016-10-12 ENCOUNTER — Inpatient Hospital Stay: Payer: BLUE CROSS/BLUE SHIELD | Attending: Hematology and Oncology

## 2016-10-12 ENCOUNTER — Inpatient Hospital Stay (HOSPITAL_BASED_OUTPATIENT_CLINIC_OR_DEPARTMENT_OTHER): Payer: BLUE CROSS/BLUE SHIELD | Admitting: Hematology and Oncology

## 2016-10-12 VITALS — BP 126/81 | HR 82 | Temp 98.8°F | Resp 18 | Wt 178.0 lb

## 2016-10-12 DIAGNOSIS — R4182 Altered mental status, unspecified: Secondary | ICD-10-CM

## 2016-10-12 DIAGNOSIS — G910 Communicating hydrocephalus: Secondary | ICD-10-CM | POA: Insufficient documentation

## 2016-10-12 DIAGNOSIS — Z803 Family history of malignant neoplasm of breast: Secondary | ICD-10-CM | POA: Diagnosis not present

## 2016-10-12 DIAGNOSIS — R634 Abnormal weight loss: Secondary | ICD-10-CM | POA: Diagnosis not present

## 2016-10-12 DIAGNOSIS — R296 Repeated falls: Secondary | ICD-10-CM | POA: Diagnosis not present

## 2016-10-12 DIAGNOSIS — J45909 Unspecified asthma, uncomplicated: Secondary | ICD-10-CM | POA: Insufficient documentation

## 2016-10-12 DIAGNOSIS — Z8619 Personal history of other infectious and parasitic diseases: Secondary | ICD-10-CM | POA: Insufficient documentation

## 2016-10-12 DIAGNOSIS — Z5112 Encounter for antineoplastic immunotherapy: Secondary | ICD-10-CM | POA: Insufficient documentation

## 2016-10-12 DIAGNOSIS — E7800 Pure hypercholesterolemia, unspecified: Secondary | ICD-10-CM | POA: Insufficient documentation

## 2016-10-12 DIAGNOSIS — Z7189 Other specified counseling: Secondary | ICD-10-CM

## 2016-10-12 DIAGNOSIS — C718 Malignant neoplasm of overlapping sites of brain: Secondary | ICD-10-CM | POA: Insufficient documentation

## 2016-10-12 DIAGNOSIS — B9620 Unspecified Escherichia coli [E. coli] as the cause of diseases classified elsewhere: Secondary | ICD-10-CM | POA: Insufficient documentation

## 2016-10-12 DIAGNOSIS — Z79899 Other long term (current) drug therapy: Secondary | ICD-10-CM

## 2016-10-12 DIAGNOSIS — F29 Unspecified psychosis not due to a substance or known physiological condition: Secondary | ICD-10-CM | POA: Insufficient documentation

## 2016-10-12 DIAGNOSIS — B379 Candidiasis, unspecified: Secondary | ICD-10-CM | POA: Diagnosis not present

## 2016-10-12 DIAGNOSIS — T380X5A Adverse effect of glucocorticoids and synthetic analogues, initial encounter: Secondary | ICD-10-CM | POA: Diagnosis not present

## 2016-10-12 DIAGNOSIS — Z923 Personal history of irradiation: Secondary | ICD-10-CM | POA: Insufficient documentation

## 2016-10-12 DIAGNOSIS — R17 Unspecified jaundice: Secondary | ICD-10-CM

## 2016-10-12 DIAGNOSIS — N390 Urinary tract infection, site not specified: Secondary | ICD-10-CM | POA: Diagnosis not present

## 2016-10-12 DIAGNOSIS — C719 Malignant neoplasm of brain, unspecified: Secondary | ICD-10-CM

## 2016-10-12 DIAGNOSIS — R441 Visual hallucinations: Secondary | ICD-10-CM | POA: Diagnosis not present

## 2016-10-12 DIAGNOSIS — D72810 Lymphocytopenia: Secondary | ICD-10-CM | POA: Diagnosis not present

## 2016-10-12 DIAGNOSIS — H539 Unspecified visual disturbance: Secondary | ICD-10-CM | POA: Diagnosis not present

## 2016-10-12 DIAGNOSIS — H53462 Homonymous bilateral field defects, left side: Secondary | ICD-10-CM

## 2016-10-12 DIAGNOSIS — R440 Auditory hallucinations: Secondary | ICD-10-CM | POA: Insufficient documentation

## 2016-10-12 LAB — COMPREHENSIVE METABOLIC PANEL
ALT: 24 U/L (ref 14–54)
AST: 19 U/L (ref 15–41)
Albumin: 3.7 g/dL (ref 3.5–5.0)
Alkaline Phosphatase: 58 U/L (ref 38–126)
Anion gap: 9 (ref 5–15)
BUN: 14 mg/dL (ref 6–20)
CO2: 26 mmol/L (ref 22–32)
Calcium: 9.2 mg/dL (ref 8.9–10.3)
Chloride: 98 mmol/L — ABNORMAL LOW (ref 101–111)
Creatinine, Ser: 0.79 mg/dL (ref 0.44–1.00)
GFR calc Af Amer: >60 mL/min (ref >60)
GFR calc non Af Amer: >60 mL/min (ref >60)
Glucose, Bld: 202 mg/dL — ABNORMAL HIGH (ref 65–99)
Potassium: 4.3 mmol/L (ref 3.5–5.1)
Sodium: 133 mmol/L — ABNORMAL LOW (ref 135–145)
Total Bilirubin: 1.5 mg/dL — ABNORMAL HIGH (ref 0.3–1.2)
Total Protein: 6.4 g/dL — ABNORMAL LOW (ref 6.5–8.1)

## 2016-10-12 LAB — CBC WITH DIFFERENTIAL/PLATELET
Basophils Absolute: 0 10*3/uL (ref 0–0.1)
Basophils Relative: 0 %
Eosinophils Absolute: 0 10*3/uL (ref 0–0.7)
Eosinophils Relative: 0 %
HCT: 39.2 % (ref 35.0–47.0)
Hemoglobin: 13.8 g/dL (ref 12.0–16.0)
Lymphocytes Relative: 4 %
Lymphs Abs: 0.3 10*3/uL — ABNORMAL LOW (ref 1.0–3.6)
MCH: 34.2 pg — ABNORMAL HIGH (ref 26.0–34.0)
MCHC: 35.3 g/dL (ref 32.0–36.0)
MCV: 96.9 fL (ref 80.0–100.0)
Monocytes Absolute: 0.4 10*3/uL (ref 0.2–0.9)
Monocytes Relative: 6 %
Neutro Abs: 6.8 10*3/uL — ABNORMAL HIGH (ref 1.4–6.5)
Neutrophils Relative %: 90 %
Platelets: 182 10*3/uL (ref 150–440)
RBC: 4.05 MIL/uL (ref 3.80–5.20)
RDW: 15.7 % — ABNORMAL HIGH (ref 11.5–14.5)
WBC: 7.4 10*3/uL (ref 3.6–11.0)

## 2016-10-12 LAB — BILIRUBIN, DIRECT: Bilirubin, Direct: 0.1 mg/dL (ref 0.1–0.5)

## 2016-10-12 NOTE — Progress Notes (Signed)
Patient was seen at [facility] on 10/08/16 and did have a brain MRI but the family is unsure about what was discussed at that visit.  They are hoping Dr. Mike Gip can help them understand what is going on.

## 2016-10-12 NOTE — Patient Instructions (Signed)
Bevacizumab injection What is this medicine? BEVACIZUMAB (be va SIZ yoo mab) is a monoclonal antibody. It is used to treat many types of cancer. This medicine may be used for other purposes; ask your health care provider or pharmacist if you have questions. COMMON BRAND NAME(S): Avastin What should I tell my health care provider before I take this medicine? They need to know if you have any of these conditions: -diabetes -heart disease -high blood pressure -history of coughing up blood -prior anthracycline chemotherapy (e.g., doxorubicin, daunorubicin, epirubicin) -recent or ongoing radiation therapy -recent or planning to have surgery -stroke -an unusual or allergic reaction to bevacizumab, hamster proteins, mouse proteins, other medicines, foods, dyes, or preservatives -pregnant or trying to get pregnant -breast-feeding How should I use this medicine? This medicine is for infusion into a vein. It is given by a health care professional in a hospital or clinic setting. Talk to your pediatrician regarding the use of this medicine in children. Special care may be needed. Overdosage: If you think you have taken too much of this medicine contact a poison control center or emergency room at once. NOTE: This medicine is only for you. Do not share this medicine with others. What if I miss a dose? It is important not to miss your dose. Call your doctor or health care professional if you are unable to keep an appointment. What may interact with this medicine? Interactions are not expected. This list may not describe all possible interactions. Give your health care provider a list of all the medicines, herbs, non-prescription drugs, or dietary supplements you use. Also tell them if you smoke, drink alcohol, or use illegal drugs. Some items may interact with your medicine. What should I watch for while using this medicine? Your condition will be monitored carefully while you are receiving this  medicine. You will need important blood work and urine testing done while you are taking this medicine. This medicine may increase your risk to bruise or bleed. Call your doctor or health care professional if you notice any unusual bleeding. This medicine should be started at least 28 days following major surgery and the site of the surgery should be totally healed. Check with your doctor before scheduling dental work or surgery while you are receiving this treatment. Talk to your doctor if you have recently had surgery or if you have a wound that has not healed. Do not become pregnant while taking this medicine or for 6 months after stopping it. Women should inform their doctor if they wish to become pregnant or think they might be pregnant. There is a potential for serious side effects to an unborn child. Talk to your health care professional or pharmacist for more information. Do not breast-feed an infant while taking this medicine and for 6 months after the last dose. This medicine has caused ovarian failure in some women. This medicine may interfere with the ability to have a child. You should talk to your doctor or health care professional if you are concerned about your fertility. What side effects may I notice from receiving this medicine? Side effects that you should report to your doctor or health care professional as soon as possible: -allergic reactions like skin rash, itching or hives, swelling of the face, lips, or tongue -chest pain or chest tightness -chills -coughing up blood -high fever -seizures -severe constipation -signs and symptoms of bleeding such as bloody or black, tarry stools; red or dark-brown urine; spitting up blood or brown material that looks   like coffee grounds; red spots on the skin; unusual bruising or bleeding from the eye, gums, or nose -signs and symptoms of a blood clot such as breathing problems; chest pain; severe, sudden headache; pain, swelling, warmth in  the leg -signs and symptoms of a stroke like changes in vision; confusion; trouble speaking or understanding; severe headaches; sudden numbness or weakness of the face, arm or leg; trouble walking; dizziness; loss of balance or coordination -stomach pain -sweating -swelling of legs or ankles -vomiting -weight gain Side effects that usually do not require medical attention (report to your doctor or health care professional if they continue or are bothersome): -back pain -changes in taste -decreased appetite -dry skin -nausea -tiredness This list may not describe all possible side effects. Call your doctor for medical advice about side effects. You may report side effects to FDA at 1-800-FDA-1088. Where should I keep my medicine? This drug is given in a hospital or clinic and will not be stored at home. NOTE: This sheet is a summary. It may not cover all possible information. If you have questions about this medicine, talk to your doctor, pharmacist, or health care provider.  2018 Elsevier/Gold Standard (2016-01-17 14:33:29)  

## 2016-10-12 NOTE — Progress Notes (Signed)
[facility] day:  10/12/2016   Chief Complaint: Jessica [name] is a 61 y.o. female with a glioblastoma who is seen for 3 week assessment following completion of concurrent Temodar and radiation.  HPI:  The patient was last seen in the medical oncology clinic on 09/21/2016.  At that time, she was doing well.  She had stable visual loss (left homonymous hemianopsia).  She needed assistance with her ADLs.  Exam was stable. CBC and CMP were unremarkable.    She completed radiation on 09/25/2016.  She continued Decadron 4 mg a day throughout radiation then decreased her dose to 2 mg a day upon completion of radiation.  She stopped Decadron on 10/07/2016 when she ran out.  Her husband contacted the clinic on 10/07/2016 with altered mental status (confusion, disorientation, and wandering around the house).  She was directed th the [facility] ER.  She was seen at [facility].  She was seen in the Brain Tumor Clinic at [facility] on 10/08/2016 by Dr. Lady Deutscher.  She was noted to be unsteady with frequent falls at home due to visual impairment. She was shuffling and walking and holding onto the wall for support. She had word finding difficulty and short-term memory impairment or worse. She was wondering at night with at least 2 attempts to go outside. She had had a couple of panic attacks.  Head MRI on 10/08/2016 revealed significant interval progression of disease including increased nodular enhancing components along the resection cavity with extension past midline. There was focal rim-enhancing lesion and she is 1.6 cm compared to 1 cm) along the right frontal periventricular region concerning for additional distant residual disease. Findings were felt consistent with recent radiation therapy versus true disease progression.  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  Recommendation was for review of her MRI with radiation oncology. If all of her new  enhancement was within the treatment field, it was recommended that she proceed with 5 day Temodar (150 - 200 mg/m2 po day 1 through 5 on a 28 day cycle) with immediate concurrent Avastin 10 mg/kg every 2 weeks. If any of the new enhancement was outside of the radiation field, she was felt to have a Temodar failure.  If this was determined, recommendation was for lomustine 90 mg/m po every 6 weeks with immediate Avastin 10 mg/kg every 2 weeks. It was recommended that her Decadron 4 mg twice a day be tapered once she started Avastin.  She was prescribed risperidone 0.5 mg at night to help with nighttime agitation.  During the interim, she has felt "ok". She is back on her Decadron 4 mg BID.  She is having more difficulties at home.  She has trouble seeing out of both eyes; "I see shadows".  She has requiried increased assistance with ADLs.  She cannot dress, toilet, or bathe herself alone.  Home health agency has been contacted for assistance.  She denies nausea and vomiting.  She has had problems with loose bowels. She denies any seizures.  She is not taking Keppra.  She stopped her Septra prophylaxis.   Past Medical History:  Diagnosis Date  . Asthma    Pt does not use inhaler   . Elevated cholesterol   . Glioblastoma ([place])   . History of chicken pox   . History of measles   . History of mumps     Past Surgical History:  Procedure Laterality Date  . CT Scan of head  2009  negative  . FOOT SURGERY    . LAPAROSCOPY     abdominal endometriosis  . TUBAL LIGATION  1984    Family History  Problem Relation Age of Onset  . Breast cancer Mother 34  . Depression Mother   . Hyperlipidemia Mother   . Hypothyroidism Mother   . Hypertension Mother   . Diabetes Father   . Hypertension Father   . Hypertension Brother   . Hypertension Sister     Social History:  reports that she has never smoked. She has never used smokeless tobacco. She reports that she drinks alcohol. She reports that she  does not use drugs.  She has 2 children (age 37 and 80).  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  She lives in [place].  The patient is accompanied by her husband Yvone Neu) and her mother today.  Allergies:  Allergies  Allergen Reactions  . Augmentin [Amoxicillin-Pot Clavulanate] Nausea And Vomiting  . Compazine  [Prochlorperazine]     swelling, SOB.  Marland Kitchen Oxycodone Nausea Only    Current Medications: Current Outpatient Prescriptions  Medication Sig Dispense Refill  . acetaminophen (TYLENOL) 325 MG tablet Take 975 mg by mouth every 6 (six) hours as needed.    . ALPRAZolam (XANAX) 1 MG tablet Take 1 tablet (1 mg total) by mouth daily as needed for anxiety. 30 tablet 0  . atorvastatin (LIPITOR) 40 MG tablet TAKE 1 TABLET DAILY 90 tablet 4  . bacitracin 500 UNIT/GM ointment Apply 1 application topically daily.    . busPIRone (BUSPAR) 5 MG tablet Take 1 tablet (5 mg total) by mouth 3 (three) times daily. 270 tablet 3  . dexamethasone (DECADRON) 2 MG tablet Take by mouth.    . fluticasone (FLONASE) 50 MCG/ACT nasal spray SHAKE LIQUID AND USE 2 SPRAYS IN EACH NOSTRIL DAILY 16 g 5  . levETIRAcetam (KEPPRA) 1000 MG tablet Take 1,000 mg by mouth See admin instructions. tk 1078m bid x 10 days, if no seizures then decrease to 5048mbid for 10 days, then if no seizures may stop    . mometasone (NASONEX) 50 MCG/ACT nasal spray Place 2 sprays into the nose daily.    . montelukast (SINGULAIR) 10 MG tablet Take 10 mg by mouth at bedtime.    . Marland Kitchenmeprazole (PRILOSEC) 20 MG capsule Take 1 capsule (20 mg total) by mouth daily. 90 capsule 3  . ondansetron (ZOFRAN-ODT) 8 MG disintegrating tablet Take 1 tablet (8 mg total) by mouth every 8 (eight) hours as needed for nausea or vomiting. 30 tablet 1  . polyethylene glycol (MIRALAX / GLYCOLAX) packet Take 17 g by mouth daily as needed.    . risperiDONE (RISPERDAL) 0.5 MG tablet     . sennosides-docusate sodium (SENOKOT-S) 8.6-50 MG tablet Take 2  tablets by mouth 2 (two) times daily.    . sertraline (ZOLOFT) 25 MG tablet Take by mouth.    . solifenacin (VESICARE) 10 MG tablet Take by mouth daily.    . traZODone (DESYREL) 50 MG tablet Take 50-100 mg by mouth at bedtime.    . Marland KitchenuPROPion (WELLBUTRIN XL) 300 MG 24 hr tablet Take 1 tablet (300 mg total) by mouth daily. (Patient not taking: Reported on 07/27/2016) 90 tablet 3  . fluconazole (DIFLUCAN) 100 MG tablet Take 1 tablet (100 mg total) by mouth daily. (Patient not taking: Reported on 10/12/2016) 5 tablet 0  . HYDROcodone-homatropine (HYCODAN) 5-1.5 MG/5ML syrup 5 ml 4-6 hours as needed for cough (Patient not  taking: Reported on 07/07/2016) 240 mL 0  . oxyCODONE (OXY IR/ROXICODONE) 5 MG immediate release tablet Take 5 mg by mouth every 6 (six) hours as needed.    . potassium chloride SA (K-DUR,KLOR-CON) 20 MEQ tablet Take 1 tablet (20 mEq total) by mouth daily. (Patient not taking: Reported on 09/21/2016) 7 tablet 0  . sulfamethoxazole-trimethoprim (BACTRIM DS,SEPTRA DS) 800-160 MG tablet Take 1 tablet by mouth 3 (three) times a week. (Patient not taking: Reported on 10/12/2016) 30 tablet 0  . temozolomide (TEMODAR) 100 MG capsule May take on an empty stomach or at bedtime to decrease nausea & vomiting. (Patient not taking: Reported on 10/12/2016) 42 capsule 0  . temozolomide (TEMODAR) 20 MG capsule Take 2 capsules (40 mg total) by mouth daily. May take on an empty stomach or at bedtime to decrease nausea & vomiting. (Patient not taking: Reported on 10/12/2016) 84 capsule 0  . zolpidem (AMBIEN) 5 MG tablet TAKE 1 TABLET BY MOUTH EVERY NIGHT AT BEDTIME AS NEEDED FOR INSOMNIA (Patient not taking: Reported on 07/07/2016) 30 tablet 5   No current facility-administered medications for this visit.     Review of Systems:  GENERAL:  Feels "ok".  No fevers or sweats. Weight up 2 pounds since last visit.  PERFORMANCE STATUS (ECOG):  3. HEENT:  Bilateral vision loss; seeing shadows.  Nasal congestion. Ears  "stopped up". No sore throat, mouth sores or tenderness. Lungs: No shortness of breath or cough.  No hemoptysis. Cardiac:  No chest pain, palpitations, orthopnea, or PND. GI:  No nausea, vomiting, diarrhea, constipation, melena or hematochezia.  Pain pills cause emesis. GU:  No urgency, frequency, dysuria, or hematuria. Musculoskeletal:  No back pain.  No joint pain.  No muscle tenderness. Extremities:  Legs weak.  No pain or swelling. Skin:  Decubitus ulcer, resolved.  No other rashes or skin changes. Neuro:  Left homonymous hemianopsia.  Further decline in vision.  Unsteady on feet.  No headache or focal numbness. Endocrine:  No diabetes, thyroid issues, hot flashes or night sweats. Psych:  No mood changes, depression or anxiety. Pain:  No focal pain. Review of systems:  All other systems reviewed and found to be negative.  Physical Exam: Blood pressure 126/81, pulse 82, temperature 98.8 F (37.1 C), temperature source Tympanic, resp. rate 18, weight 178 lb (80.7 kg). GENERAL:  Well developed, well nourished, woman sitting comfortably in the exam room in no acute distress.  She requires a 2 person assist onto the exam table.   MENTAL STATUS:  Alert and oriented to person, place and time. HEAD:  Shoulder length light brown hair.  Face full.  Cushingoid features. Normocephalic, atraumatic, and symmetric.  EYES:  Glasses.  Blue eyes.  Pupils equal round and reactive to light and accomodation.  No conjunctivitis or scleral icterus. ENT:  No oral lesions. No thrush.  Mucous membranes moist.  RESPIRATORY:  Clear to auscultation without rales, wheezes or rhonchi. CARDIOVASCULAR:  Regular rate and rhythm without murmur, rub or gallop. ABDOMEN:  Soft, non-tender, with active bowel sounds, and no hepatosplenomegaly.  No masses. SKIN:  No rashes or ulcers.  EXTREMITIES: No edema, no skin discoloration or tenderness.  No palpable cords. LYMPH NODES: No palpable cervical, supraclavicular, axillary or  inguinal adenopathy  NEUROLOGICAL: Cranial nerves II-XII intact except for markedly decreased vision; motor strength 5/5 throughout; sensation intact;  difficult with multi-step commends; finger to nose and RAM difficult; gait wide based and shuffling; no clonus or Babinski.  PSYCH:  Appropriate.  Appointment on 10/12/2016  Component Date Value Ref Range Status  . Sodium 10/12/2016 133* 135 - 145 mmol/L Final  . Potassium 10/12/2016 4.3  3.5 - 5.1 mmol/L Final  . Chloride 10/12/2016 98* 101 - 111 mmol/L Final  . CO2 10/12/2016 26  22 - 32 mmol/L Final  . Glucose, Bld 10/12/2016 202* 65 - 99 mg/dL Final  . BUN 10/12/2016 14  6 - 20 mg/dL Final  . Creatinine, Ser 10/12/2016 0.79  0.44 - 1.00 mg/dL Final  . Calcium 10/12/2016 9.2  8.9 - 10.3 mg/dL Final  . Total Protein 10/12/2016 6.4* 6.5 - 8.1 g/dL Final  . Albumin 10/12/2016 3.7  3.5 - 5.0 g/dL Final  . AST 10/12/2016 19  15 - 41 U/L Final  . ALT 10/12/2016 24  14 - 54 U/L Final  . Alkaline Phosphatase 10/12/2016 58  38 - 126 U/L Final  . Total Bilirubin 10/12/2016 1.5* 0.3 - 1.2 mg/dL Final  . GFR calc non Af Amer 10/12/2016 >60  >60 mL/min Final  . GFR calc Af Amer 10/12/2016 >60  >60 mL/min Final   Comment: (NOTE) The eGFR has been calculated using the CKD EPI equation. This calculation has not been validated in all clinical situations. eGFR's persistently <60 mL/min signify possible Chronic Kidney Disease.   . Anion gap 10/12/2016 9  5 - 15 Final  . WBC 10/12/2016 7.4  3.6 - 11.0 K/uL Final  . RBC 10/12/2016 4.05  3.80 - 5.20 MIL/uL Final  . Hemoglobin 10/12/2016 13.8  12.0 - 16.0 g/dL Final  . HCT 10/12/2016 39.2  35.0 - 47.0 % Final  . MCV 10/12/2016 96.9  80.0 - 100.0 fL Final  . MCH 10/12/2016 34.2* 26.0 - 34.0 pg Final  . MCHC 10/12/2016 35.3  32.0 - 36.0 g/dL Final  . RDW 10/12/2016 15.7* 11.5 - 14.5 % Final  . Platelets 10/12/2016 182  150 - 440 K/uL Final  . Neutrophils Relative % 10/12/2016 90  % Final  . Neutro  Abs 10/12/2016 6.8* 1.4 - 6.5 K/uL Final  . Lymphocytes Relative 10/12/2016 4  % Final  . Lymphs Abs 10/12/2016 0.3* 1.0 - 3.6 K/uL Final  . Monocytes Relative 10/12/2016 6  % Final  . Monocytes Absolute 10/12/2016 0.4  0.2 - 0.9 K/uL Final  . Eosinophils Relative 10/12/2016 0  % Final  . Eosinophils Absolute 10/12/2016 0.0  0 - 0.7 K/uL Final  . Basophils Relative 10/12/2016 0  % Final  . Basophils Absolute 10/12/2016 0.0  0 - 0.1 K/uL Final    Assessment:  ZARRA GEFFERT is a 61 y.o. 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.    Head MRI at [facility] on 07/01/2016 revealed a 5.7 x 4.5 cm heterogeneously enhancing T2 hyperintense lesion in the right is posterior medial temporal lobe with extension into the adjacent right parietal and occipital lobes.  There was a separate enhancing component along the ependymal surface at the body of the right lateral ventricle extending into the centrum semiovale. There was marked mass effect on the atrium and occipital horn of the right lateral ventricle with slight increase in dilation of the temporal horn of the right lateral ventricle, consistent with at least partial entrapment. There was stable dilation of the left lateral ventricle.  There was no hemorrhage or large acute cortical infarction.   Head MRI at [facility] on 07/02/2016 revealed substantial resection of the mass in the right cerebral hemisphere with residual  disease disease. There was T2/FLAIR signal hyperintensity along the margins of the resection cavity. There was focal masslike T2/FLAIR signal hyperintensity and enhancement in the right frontal periventricular white matter, and within the splenium of the corpus callosum compatible with residual disease. There was enhancement along the margins of the resection cavity, which appeared nodular at the posterolateral aspect.  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  She received cranial  radiation from 08/06/2016 - 09/25/2016.  She received daily concurrent Temodar (08/10/2016 - 09/25/2016).  She was on Bactrim prophylaxis.  She is on Decadron 4 mg BID.  Head MRI at [facility] on 10/08/2016 revealed significant interval progression of disease including increased nodular enhancing components along the resection cavity with extension past midline. There was focal rim-enhancing lesion and she is 1.6 cm compared to 1 cm) along the right frontal periventricular region concerning for additional distant residual disease. Findings were felt consistent with recent radiation therapy versus true disease progression.  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  Symptomatically, she has progressive vision problems.  She needs assistance with her ADLs.  Exam reveals Cushingoid features and marked decline in vision.  She has persistent lymphocytopenia s/p Temodar.  Bilirubin is elevated.   Plan: 1.  Labs today:  CBC with diff, CMP. 2.  Discuss interim events and recent head MRI.  Discuss progression versus changes in radiation field.  Review images with radiation oncology.  Discuss initiation of Avastin with either Temodar or lomustine.  Side effects reviewed. 3.  Continue Septra DS 1 tablet every Monday, Wednesday, Friday for PCP prophylaxis.  Continue until resolution of lymphopenia.  Encouraged patient to restart.  4.  Continue Decadron 4 mg BID.  Begin taper once Avastin started.  Discuss with radiation oncology.  5.  Phone follow up with Dr. Wynetta Emery regarding treatment plan. 6.  Preauthorize Avastin.   7.  Schedule for next chemotherapy class.  8.  RTC in 1 week for MD assessment and labs (CBC with diff, CMP, urine protein for pre-Avastin) and Avastin.   Melissa C. Mike Gip, MD  10/12/2016, 3:25 PM

## 2016-10-13 ENCOUNTER — Ambulatory Visit: Payer: BLUE CROSS/BLUE SHIELD | Admitting: Physical Therapy

## 2016-10-13 ENCOUNTER — Encounter: Payer: Self-pay | Admitting: Hematology and Oncology

## 2016-10-13 NOTE — Progress Notes (Signed)
DISCONTINUE ON PATHWAY REGIMEN - [Other Dx]  No Medical Intervention - Off Treatment.  REASON: Other Reason PRIOR TREATMENT: Off Treatment  [Other Dx] - No Medical Intervention - Off Treatment.  Patient Characteristics:

## 2016-10-13 NOTE — Progress Notes (Signed)
DISCONTINUE OFF PATHWAY REGIMEN - [Other Dx]   [id number]:Temozolomide 75 mg/m2 PO D1-42 + RT:   One cycle, daily for 42 days concurrent with RT:     Temozolomide   **Always confirm dose/schedule in your pharmacy ordering system**    REASON: Other Reason PRIOR TREATMENT: Temozolomide 75 mg/m2 PO D1-42 + RT TREATMENT RESPONSE: Unable to Evaluate  [Other Dx] - No Medical Intervention - Off Treatment.  Patient Characteristics:

## 2016-10-13 NOTE — Progress Notes (Signed)
OFF PATHWAY REGIMEN - [Other Dx]  No Change  Continue With Treatment as Ordered.   [id number]:Temozolomide 75 mg/m2 PO D1-42 + RT:   One cycle, daily for 42 days concurrent with RT:     Temozolomide   **Always confirm dose/schedule in your pharmacy ordering system**    Patient Characteristics: Intent of Therapy: Non-Curative / Palliative Intent, Discussed with Patient

## 2016-10-13 NOTE — Progress Notes (Signed)
DISCONTINUE ON PATHWAY REGIMEN - [Other Dx]  No Medical Intervention - Off Treatment.  REASON: Other Reason PRIOR TREATMENT: Off Treatment  START OFF PATHWAY REGIMEN - [Other Dx]   [id number]:Bevacizumab 10 mg/kg q14 days:   A cycle is every 14 days:     Bevacizumab   **Always confirm dose/schedule in your pharmacy ordering system**    Patient Characteristics: Intent of Therapy: Non-Curative / Palliative Intent, Discussed with Patient

## 2016-10-14 ENCOUNTER — Other Ambulatory Visit: Payer: Self-pay | Admitting: *Deleted

## 2016-10-14 DIAGNOSIS — H53462 Homonymous bilateral field defects, left side: Secondary | ICD-10-CM

## 2016-10-14 DIAGNOSIS — R17 Unspecified jaundice: Secondary | ICD-10-CM

## 2016-10-14 DIAGNOSIS — C719 Malignant neoplasm of brain, unspecified: Secondary | ICD-10-CM

## 2016-10-14 MED ORDER — SULFAMETHOXAZOLE-TRIMETHOPRIM 800-160 MG PO TABS: 1.0000 | ORAL_TABLET | ORAL | 0 refills | Status: AC

## 2016-10-14 NOTE — Telephone Encounter (Signed)
Son (who is not on HIPPA list,but will have patient husband add him at next visit) called to report that patient is in need of refill on her Septra. If in agreement which per office note from 9/10 she is to continue this. Please approve med

## 2016-10-15 ENCOUNTER — Ambulatory Visit: Payer: BLUE CROSS/BLUE SHIELD | Admitting: Physical Therapy

## 2016-10-15 ENCOUNTER — Other Ambulatory Visit: Payer: Self-pay

## 2016-10-15 ENCOUNTER — Emergency Department: Payer: Self-pay

## 2016-10-15 ENCOUNTER — Encounter: Payer: Self-pay | Admitting: Emergency Medicine

## 2016-10-15 ENCOUNTER — Emergency Department: Payer: BLUE CROSS/BLUE SHIELD

## 2016-10-15 ENCOUNTER — Emergency Department
Admission: EM | Admit: 2016-10-15 | Discharge: 2016-10-15 | Disposition: A | Discharge status: Other Acute Inpatient Hospital | Payer: BLUE CROSS/BLUE SHIELD | Attending: Emergency Medicine | Admitting: Emergency Medicine

## 2016-10-15 DIAGNOSIS — R451 Restlessness and agitation: Secondary | ICD-10-CM

## 2016-10-15 DIAGNOSIS — J45909 Unspecified asthma, uncomplicated: Secondary | ICD-10-CM | POA: Insufficient documentation

## 2016-10-15 DIAGNOSIS — Z79899 Other long term (current) drug therapy: Secondary | ICD-10-CM | POA: Insufficient documentation

## 2016-10-15 DIAGNOSIS — R4182 Altered mental status, unspecified: Secondary | ICD-10-CM | POA: Diagnosis not present

## 2016-10-15 LAB — URINE DRUG SCREEN, QUALITATIVE (ARMC ONLY)
Amphetamines, Ur Screen: NOT DETECTED
BENZODIAZEPINE, UR SCRN: POSITIVE — AB
Barbiturates, Ur Screen: NOT DETECTED
Cannabinoid 50 Ng, Ur ~~LOC~~: NOT DETECTED
Cocaine Metabolite,Ur ~~LOC~~: NOT DETECTED
MDMA (ECSTASY) UR SCREEN: NOT DETECTED
Methadone Scn, Ur: NOT DETECTED
Opiate, Ur Screen: NOT DETECTED
PHENCYCLIDINE (PCP) UR S: NOT DETECTED
TRICYCLIC, UR SCREEN: NOT DETECTED

## 2016-10-15 LAB — COMPREHENSIVE METABOLIC PANEL
ALK PHOS: 61 U/L (ref 38–126)
ALT: 26 U/L (ref 14–54)
ANION GAP: 10 (ref 5–15)
AST: 16 U/L (ref 15–41)
Albumin: 3.9 g/dL (ref 3.5–5.0)
BILIRUBIN TOTAL: 2.1 mg/dL — AB (ref 0.3–1.2)
BUN: 13 mg/dL (ref 6–20)
CALCIUM: 9.7 mg/dL (ref 8.9–10.3)
CO2: 26 mmol/L (ref 22–32)
Chloride: 100 mmol/L — ABNORMAL LOW (ref 101–111)
Creatinine, Ser: 0.82 mg/dL (ref 0.44–1.00)
GFR calc Af Amer: >60 mL/min (ref >60)
GLUCOSE: 145 mg/dL — AB (ref 65–99)
POTASSIUM: 3.9 mmol/L (ref 3.5–5.1)
Sodium: 136 mmol/L (ref 135–145)
TOTAL PROTEIN: 6.4 g/dL — AB (ref 6.5–8.1)

## 2016-10-15 LAB — CBC
HEMATOCRIT: 40.1 % (ref 35.0–47.0)
HEMOGLOBIN: 14.1 g/dL (ref 12.0–16.0)
MCH: 33.9 pg (ref 26.0–34.0)
MCHC: 35.3 g/dL (ref 32.0–36.0)
MCV: 96 fL (ref 80.0–100.0)
Platelets: 161 10*3/uL (ref 150–440)
RBC: 4.17 MIL/uL (ref 3.80–5.20)
RDW: 15.9 % — ABNORMAL HIGH (ref 11.5–14.5)
WBC: 6.2 10*3/uL (ref 3.6–11.0)

## 2016-10-15 MED ORDER — ZIPRASIDONE MESYLATE 20 MG IM SOLR
INTRAMUSCULAR | Status: AC
  Administered 2016-10-15: 20 mg via INTRAMUSCULAR
  Filled 2016-10-15: qty 20

## 2016-10-15 MED ORDER — LORAZEPAM 2 MG/ML IJ SOLN
INTRAMUSCULAR | Status: AC
  Administered 2016-10-15: 1 mg via INTRAVENOUS
  Filled 2016-10-15: qty 1

## 2016-10-15 MED ORDER — LORAZEPAM 2 MG/ML IJ SOLN
1.0000 mg | Freq: Once | INTRAMUSCULAR | Status: AC
  Administered 2016-10-15: 1 mg via INTRAVENOUS

## 2016-10-15 MED ORDER — ZIPRASIDONE MESYLATE 20 MG IM SOLR
20.0000 mg | Freq: Once | INTRAMUSCULAR | Status: AC
  Administered 2016-10-15: 20 mg via INTRAMUSCULAR

## 2016-10-15 NOTE — ED Notes (Signed)
Pt given 2 more warm blankets at this time. Pt's family remains at bedside. Pt states that she is having a panic attack. Will continue to monitor for further patient needs.

## 2016-10-15 NOTE — ED Notes (Signed)
PT pulling off bp cuff, sp02 monitoring, cardiac leads, and clothing, trying to get out of the bed, keeps saying "how am I going to get out of here, I got to get out of here"

## 2016-10-15 NOTE — Patient Instructions (Signed)
Bevacizumab injection What is this medicine? BEVACIZUMAB (be va SIZ yoo mab) is a monoclonal antibody. It is used to treat many types of cancer. This medicine may be used for other purposes; ask your health care provider or pharmacist if you have questions. COMMON BRAND NAME(S): Avastin What should I tell my health care provider before I take this medicine? They need to know if you have any of these conditions: -diabetes -heart disease -high blood pressure -history of coughing up blood -prior anthracycline chemotherapy (e.g., doxorubicin, daunorubicin, epirubicin) -recent or ongoing radiation therapy -recent or planning to have surgery -stroke -an unusual or allergic reaction to bevacizumab, hamster proteins, mouse proteins, other medicines, foods, dyes, or preservatives -pregnant or trying to get pregnant -breast-feeding How should I use this medicine? This medicine is for infusion into a vein. It is given by a health care professional in a hospital or clinic setting. Talk to your pediatrician regarding the use of this medicine in children. Special care may be needed. Overdosage: If you think you have taken too much of this medicine contact a poison control center or emergency room at once. NOTE: This medicine is only for you. Do not share this medicine with others. What if I miss a dose? It is important not to miss your dose. Call your doctor or health care professional if you are unable to keep an appointment. What may interact with this medicine? Interactions are not expected. This list may not describe all possible interactions. Give your health care provider a list of all the medicines, herbs, non-prescription drugs, or dietary supplements you use. Also tell them if you smoke, drink alcohol, or use illegal drugs. Some items may interact with your medicine. What should I watch for while using this medicine? Your condition will be monitored carefully while you are receiving this  medicine. You will need important blood work and urine testing done while you are taking this medicine. This medicine may increase your risk to bruise or bleed. Call your doctor or health care professional if you notice any unusual bleeding. This medicine should be started at least 28 days following major surgery and the site of the surgery should be totally healed. Check with your doctor before scheduling dental work or surgery while you are receiving this treatment. Talk to your doctor if you have recently had surgery or if you have a wound that has not healed. Do not become pregnant while taking this medicine or for 6 months after stopping it. Women should inform their doctor if they wish to become pregnant or think they might be pregnant. There is a potential for serious side effects to an unborn child. Talk to your health care professional or pharmacist for more information. Do not breast-feed an infant while taking this medicine and for 6 months after the last dose. This medicine has caused ovarian failure in some women. This medicine may interfere with the ability to have a child. You should talk to your doctor or health care professional if you are concerned about your fertility. What side effects may I notice from receiving this medicine? Side effects that you should report to your doctor or health care professional as soon as possible: -allergic reactions like skin rash, itching or hives, swelling of the face, lips, or tongue -chest pain or chest tightness -chills -coughing up blood -high fever -seizures -severe constipation -signs and symptoms of bleeding such as bloody or black, tarry stools; red or dark-brown urine; spitting up blood or brown material that looks   like coffee grounds; red spots on the skin; unusual bruising or bleeding from the eye, gums, or nose -signs and symptoms of a blood clot such as breathing problems; chest pain; severe, sudden headache; pain, swelling, warmth in  the leg -signs and symptoms of a stroke like changes in vision; confusion; trouble speaking or understanding; severe headaches; sudden numbness or weakness of the face, arm or leg; trouble walking; dizziness; loss of balance or coordination -stomach pain -sweating -swelling of legs or ankles -vomiting -weight gain Side effects that usually do not require medical attention (report to your doctor or health care professional if they continue or are bothersome): -back pain -changes in taste -decreased appetite -dry skin -nausea -tiredness This list may not describe all possible side effects. Call your doctor for medical advice about side effects. You may report side effects to FDA at 1-800-FDA-1088. Where should I keep my medicine? This drug is given in a hospital or clinic and will not be stored at home. NOTE: This sheet is a summary. It may not cover all possible information. If you have questions about this medicine, talk to your doctor, pharmacist, or health care provider.  2018 Elsevier/Gold Standard (2016-01-17 14:33:29)  

## 2016-10-15 NOTE — ED Notes (Signed)
Pt transferred to [facility] via [facility]. Pt's husband consents for transfer at this time. No change in patient condition.

## 2016-10-15 NOTE — ED Notes (Signed)
Pt noted to have pulled off BP cuff. Is noted to be continually calling out and crying, family remains at bedside, states she is still panicking. MD notified. Received orders for Geodon.

## 2016-10-15 NOTE — ED Triage Notes (Signed)
Pt presents to ED via [facility] with c/o altered mental status. Per EMS pt was dx with brain cancer approx 3-4 months ago, initially called out for patient being unable to sleep, upon EMS arrival, pt's son reports that patient has been "confused and out of it x 2 weeks". EMS reports last night, pt received 1 mg of Xanax at 1230am, 0400am, and at some point during the night 100mg  Trazadone for sleep. Pt presents, responsive to voice, confused to time and situation, and laying in bed with eyes closed but will open when instructed.

## 2016-10-15 NOTE — ED Provider Notes (Signed)
-----------------------------------------   6:53 PM on 10/15/2016 -----------------------------------------  [facility] is here to transport the patient to [facility].  She has been intermittently agitated and combative but calmed down once she removed her monitoring equipment.  I feel that is appropriate and better for her to help her remain calm.  He is stable and appropriate for transport, and I completed my EMTALA reassessment.   Hinda Kehr, MD 10/15/16 [phone]

## 2016-10-15 NOTE — ED Provider Notes (Addendum)
[facility] Emergency Department Provider Note  Time seen: 11:55 AM  I have reviewed the triage vital signs and the nursing notes.   HISTORY  Chief Complaint Altered Mental Status    HPI Jessica [name] is a 61 y.o. 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 According to EMS there were called out to the patient's residence by her husband as the patient was not able to sleep and was acting confused/altered. Husband states the confusion/altered mental status has been ongoing for the past 2 weeks. Record review shows a 10/07/16 the patient was seen for the same ultimately at [facility] and MRI showing progression of disease. Here the patient is extremely somnolent but will awaken to voice. Mumbles answers to questions but appears to be somewhat accurate. She is currently disoriented to time and situation. Patient denies any complaints. When asked specifically regarding chest pain abdominal pain etc. she denies pain. EMS reports husband states the patient did receive Xanax at 12:30 AM, 4 AM and 100 mg of trazodone overnight at some point, she was having a lot of difficulty sleeping.  Past Medical History:  Diagnosis Date  . Asthma    Pt does not use inhaler   . Elevated cholesterol   . Glioblastoma ([place])   . History of chicken pox   . History of measles   . History of mumps     Patient Active Problem List   Diagnosis Date Noted  . Goals of care, counseling/discussion 09/20/2016  . Thrush 09/13/2016  . Lymphopenia 09/13/2016  . Elevated bilirubin 09/13/2016  . 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 Decubitus ulcer 08/16/2016  . Glioblastoma ([place]) 07/14/2016  . Anxiety 02/21/2015  . Depression 02/21/2015  . 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 Vitamin D deficiency 10/15/2008  . Allergic rhinitis  03/19/2007  . Esophageal reflux 03/19/2007  . Family history of breast cancer 03/19/2007  . Hypercholesteremia 03/19/2007  . Insomnia 03/19/2007  . Menopausal symptom 03/19/2007    Past Surgical History:  Procedure Laterality Date  . CT Scan of head  2009   negative  . FOOT SURGERY    . LAPAROSCOPY     abdominal endometriosis  . TUBAL LIGATION  1984    Prior to Admission medications   Medication Sig Start Date End Date Taking? Authorizing Provider  acetaminophen (TYLENOL) 325 MG tablet Take 975 mg by mouth every 6 (six) hours as needed.    [provider]  ALPRAZolam Duanne Moron) 1 MG tablet Take 1 tablet (1 mg total) by mouth daily as needed for anxiety. 09/04/16   Consuello Bossier, MD  atorvastatin (LIPITOR) 40 MG tablet TAKE 1 TABLET DAILY 03/28/15   Birdie Sons, MD  bacitracin 500 UNIT/GM ointment Apply 1 application topically daily.    [provider]  buPROPion (WELLBUTRIN XL) 300 MG 24 hr tablet Take 1 tablet (300 mg total) by mouth daily. Patient not taking: Reported on 07/27/2016 07/08/14   Birdie Sons, MD  busPIRone (BUSPAR) 5 MG tablet Take 1 tablet (5 mg total) by mouth 3 (three) times daily. 03/01/15   Birdie Sons, MD  dexamethasone (DECADRON) 2 MG tablet Take by mouth. 10/08/16 11/07/16  [provider]  fluconazole (DIFLUCAN) 100 MG tablet Take 1 tablet (100 mg total) by mouth daily. Patient not taking: Reported on 10/12/2016 09/22/16   Noreene Filbert,  MD  fluticasone (FLONASE) 50 MCG/ACT nasal spray SHAKE LIQUID AND USE 2 SPRAYS IN [facility] NOSTRIL DAILY 01/31/15   Birdie Sons, MD  HYDROcodone-homatropine [facility]) 5-1.5 MG/5ML syrup 5 ml 4-6 hours as needed for cough Patient not taking: Reported on 07/07/2016 02/22/15   Carmon Ginsberg, PA  levETIRAcetam (KEPPRA) 1000 MG tablet Take 1,000 mg by mouth See admin instructions. tk 1000mg  bid x 10 days, if no seizures then decrease to 500mg  bid for 10 days, then if no seizures may stop     [provider]  mometasone (NASONEX) 50 MCG/ACT nasal spray Place 2 sprays into the nose daily.    [provider]  montelukast (SINGULAIR) 10 MG tablet Take 10 mg by mouth at bedtime.    [provider]  omeprazole (PRILOSEC) 20 MG capsule Take 1 capsule (20 mg total) by mouth daily. 07/08/14   Birdie Sons, MD  ondansetron (ZOFRAN-ODT) 8 MG disintegrating tablet Take 1 tablet (8 mg total) by mouth every 8 (eight) hours as needed for nausea or vomiting. 07/27/16   Sindy Guadeloupe, MD  oxyCODONE (OXY IR/ROXICODONE) 5 MG immediate release tablet Take 5 mg by mouth every 6 (six) hours as needed.    [provider]  polyethylene glycol (MIRALAX / GLYCOLAX) packet Take 17 g by mouth daily as needed.    [provider]  potassium chloride SA (K-DUR,KLOR-CON) 20 MEQ tablet Take 1 tablet (20 mEq total) by mouth daily. Patient not taking: Reported on 09/21/2016 07/27/16   Sindy Guadeloupe, MD  risperiDONE (RISPERDAL) 0.5 MG tablet  10/08/16   [provider]  sennosides-docusate sodium (SENOKOT-S) 8.6-50 MG tablet Take 2 tablets by mouth 2 (two) times daily.    [provider]  sertraline (ZOLOFT) 25 MG tablet Take by mouth. 07/09/16 07/09/17  [provider]  solifenacin (VESICARE) 10 MG tablet Take by mouth daily.    [provider]  sulfamethoxazole-trimethoprim (BACTRIM DS,SEPTRA DS) 800-160 MG tablet Take 1 tablet by mouth 3 (three) times a week. 10/14/16   Karen Kitchens, NP  temozolomide [facility]) 100 MG capsule May take on an empty stomach or at bedtime to decrease nausea & vomiting. Patient not taking: Reported on 10/12/2016 07/21/16   Lequita Asal, MD  temozolomide (TEMODAR) 20 MG capsule Take 2 capsules (40 mg total) by mouth daily. May take on an empty stomach or at bedtime to decrease nausea & vomiting. Patient not taking: Reported on 10/12/2016 07/21/16   Lequita Asal, MD  traZODone (DESYREL) 50 MG tablet Take  50-100 mg by mouth at bedtime.    [provider]  zolpidem (AMBIEN) 5 MG tablet TAKE 1 TABLET BY MOUTH EVERY NIGHT AT BEDTIME AS NEEDED FOR INSOMNIA Patient not taking: Reported on 07/07/2016 05/18/15   Birdie Sons, MD    Allergies  Allergen Reactions  . Augmentin [Amoxicillin-Pot Clavulanate] Nausea And Vomiting  . Compazine  [Prochlorperazine]     swelling, SOB.  Marland Kitchen Oxycodone Nausea Only    Family History  Problem Relation Age of Onset  . Breast cancer Mother 2  . Depression Mother   . Hyperlipidemia Mother   . Hypothyroidism Mother   . Hypertension Mother   . Diabetes Father   . Hypertension Father   . Hypertension Brother   . Hypertension Sister     Social History Social History  Substance Use Topics  . Smoking status: Never Smoker  . Smokeless tobacco: Never Used  . Alcohol use 0.0 oz/week  Comment: occasional use    Review of Systems Constitutional: Negative for fever. Eyes: Patient has visual changes that are chronic. Cardiovascular: Negative for chest pain. Respiratory: Negative for shortness of breath. Gastrointestinal: Negative for abdominal pain Neurological: Negative for headache All other ROS negative, although somewhat limited due to the patient's altered mental status.  ____________________________________________   PHYSICAL EXAM:  VITAL SIGNS: ED Triage Vitals  Enc Vitals Group     BP 10/15/16 1153 (!) 146/71     Pulse Rate 10/15/16 1153 89     Resp 10/15/16 1153 16     Temp 10/15/16 1153 97.6 F (36.4 C)     Temp Source 10/15/16 1153 Oral     SpO2 10/15/16 1149 100 %     Weight 10/15/16 1153 178 lb (80.7 kg)     Height 10/15/16 1153 5\' 6"  (1.676 m)     Head Circumference --      Peak Flow --      Pain Score 10/15/16 1151 0     Pain Loc --      Pain Edu? --      Excl. in [place]? --     Constitutional: Patient is quite somnolent but does awaken to voice. She is oriented to person and place but not situation and time.  Patient will mumble answers to questions with somewhat slurred speech. Eyes: Normal exam ENT   Head: Normocephalic and atraumatic.   Mouth/Throat: Mucous membranes are moist. Cardiovascular: Normal rate, regular rhythm.  Respiratory: Normal respiratory effort without tachypnea nor retractions. Breath sounds are clear  Gastrointestinal: Soft and nontender. No distention.   Musculoskeletal: Nontender with normal range of motion in all extremities.  Neurologic:  Mumble speech. Patient not able to participate in a full neurological exam at this time. Skin:  Skin is warm, dry and intact.  Psychiatric: Somnolent  ____________________________________________     RADIOLOGY  IMPRESSION: 1. No acute intracranial abnormality. 2. Postsurgical changes related to prior right parieto-occipital tumor resection with resolution of previously seen postsurgical intraventricular tear and small hemorrhage along the medial margin of the resection cavity. 3. Right mastoid air cell effusion.   EKG reviewed and interpreted by myself shows likely sinus rhythmrhythm at 85 bpm, narrow QRS, normal axis, normal intervals, nonspecific ST changes.  ____________________________________________   INITIAL IMPRESSION / ASSESSMENT AND PLAN / ED COURSE  Pertinent labs & imaging results that were available during my care of the patient were reviewed by me and considered in my medical decision making (see chart for details).  Patient presents to the emergency department by EMS for not being able to sleep. They also state increased confusion but this has been ongoing for the past 2 weeks. Patient was seen at [facility] 10/08/16 with a MRI of the brain showing worsening/progression of disease. Patient is on Decadron twice daily. Patient also received Xanax twice and trazodone once overnight. Her symptoms this morning of confusion and slurred speech/mumbling could be due to medications received overnight. Currently  awaiting the husband's arrival for further history.  CT scan shows postsurgical changes with no acute abnormality. Basic labs are largely within normal limits. Urinalysis pending. Patient's family is now here including her husband with whom she lives. Husband states over the past 1-2 weeks her confusion has increased significantly, they're not able to keep her calm at home. She is restless up all the time walking around up through the night walking around, falls very often that she is very unsteady and confused. is very alert, trying  to rip her IV out, etc.  I discussed the patient with Dr. Mike Gip of oncology. She recommends discussing the patient with Dr. Cari Caraway of neurosurgery at [facility]. I discussed the patient with Dr. Cari Caraway, who states unfortunately her MRI appear to show progression of disease with a new satellite lesion. He is going to talk to his partner regarding possible medications that could help her behavioral issues versus possible transfer to [facility] for further treatment.  Dr. Cari Caraway recommends discussing the patient with neuro-oncology as the patient is established with Dr. Gaston Islam at [facility].  discussed the patient with Dr. Marcello Moores of neural oncology at [facility]. She believes her recent symptoms could be result of her starting Decadron once again. She recommends admission to the hospital, she will except as a transfer to [facility] for medication adjustments and to taper off of steroids. I discussed this with the husband who is agreeable to this plan.  ____________________________________________   FINAL CLINICAL IMPRESSION(S) / ED DIAGNOSES  Altered mental status agitation   Harvest Dark, MD 10/15/16 1538    Harvest Dark, MD 10/15/16 [phone]

## 2016-10-15 NOTE — ED Notes (Signed)
No change in patient condition. Pt remains intermittently agitated, pt's husband remains at bedside. Pt refuses to keep BP cuff, O2 sensor and heart monitor on. MD aware. Will continue to monitor for further patient needs. Awaiting transfer.

## 2016-10-15 NOTE — ED Notes (Signed)
Pt's husband asking about when patient will be transferred. Explained waiting for [facility] to be able to transfer patient. Also explained will be to bedside to obtain vitals. MD remains aware that patient will not keep heart monitor, BP cuff, and O2 sensor on. Pt visualized moving around in bed, pulling blankets off. Will continue to monitor for further changes in patient condition.

## 2016-10-15 NOTE — ED Notes (Signed)
Md notified that this RN was called to bedside by patient's family due to patient having a panic attack. VORB for 1mg  Ativan IV. Medication administered per MD order. Pt is noted to be in bed with eyes closed at this time, repeatedly stating, "I've got to get out of here, they aren't doing anything". Pt stops moaning to speak normally intermittently then goes back to moaning. When MD arrived to bedside, pt stopped moaning and crying initially then states, "I have brain cancer!". MD at bedside at this time.

## 2016-10-15 NOTE — ED Notes (Signed)
EMTALA signed by pt, VS within 30 minutes of transfer.

## 2016-10-16 ENCOUNTER — Other Ambulatory Visit: Payer: Self-pay | Admitting: Hematology and Oncology

## 2016-10-16 ENCOUNTER — Telehealth: Payer: Self-pay | Admitting: Pharmacist

## 2016-10-16 DIAGNOSIS — C719 Malignant neoplasm of brain, unspecified: Secondary | ICD-10-CM

## 2016-10-16 MED ORDER — TEMOZOLOMIDE 100 MG PO CAPS: ORAL_CAPSULE | ORAL | 0 refills | Status: DC

## 2016-10-16 NOTE — Progress Notes (Signed)
OFF PATHWAY REGIMEN - [Other Dx]  No Change  Continue With Treatment as Ordered.   [id number]:Bevacizumab 10 mg/kg q14 days:   A cycle is every 14 days:     Bevacizumab   **Always confirm dose/schedule in your pharmacy ordering system**    Patient Characteristics: Intent of Therapy: Non-Curative / Palliative Intent, Discussed with Patient

## 2016-10-16 NOTE — Progress Notes (Signed)
START ON PATHWAY REGIMEN - Neuro     A cycle is every 28 days:     Temozolomide      Temozolomide   **Always confirm dose/schedule in your pharmacy ordering system**    Patient Characteristics: Glioblastoma, Anaplastic Astrocytoma, and Anaplastic Oligodendroglioma, Recurrent or Progressive, Nonsurgical Candidate, Systemic Therapy Candidate Disease Status: Recurrent or Progressive Disease Classification: Glioblastoma Treatment Classification: Nonsurgical Candidate Treatment (Nonsurgical/Adjuvant): Systemic Therapy Candidate Would you be surprised if this patient died  in the next year<= I would be surprised if this patient died in the next year Intent of Therapy: Non-Curative / Palliative Intent, Discussed with Patient

## 2016-10-16 NOTE — Telephone Encounter (Signed)
Oral Oncology Pharmacist Encounter  Received new prescription for temozolomide (Temodar) for the treatment of glioblastoma in conjunction with Avastin, patient is continuing her treatment following concomitant radiation and temozolomide. Planned duration of 6 cycles or until disease progression or unacceptable drug toxicity.  CBC/CMP from 10/15/16 assessed, no relevant lab abnormalities. Prescription dose and frequency assessed.   Current medication list in Epic reviewed, no DDIs with temozolomide identified.  Prescription has been e-scribed to the [facility] for benefits analysis and approval.  Oral Oncology Clinic will continue to follow for insurance authorization, copayment issues, initial counseling and start date.  Darl Pikes, PharmD, BCPS Hematology/Oncology Clinical Pharmacist [facility] [phone]  10/16/2016 3:49 PM

## 2016-10-19 ENCOUNTER — Ambulatory Visit: Payer: BLUE CROSS/BLUE SHIELD | Admitting: Hematology and Oncology

## 2016-10-19 ENCOUNTER — Other Ambulatory Visit: Payer: BLUE CROSS/BLUE SHIELD

## 2016-10-19 ENCOUNTER — Ambulatory Visit: Payer: BLUE CROSS/BLUE SHIELD

## 2016-10-19 MED ORDER — TEMOZOLOMIDE 100 MG PO CAPS: ORAL_CAPSULE | ORAL | 0 refills | Status: DC

## 2016-10-19 NOTE — Telephone Encounter (Signed)
Oral Chemotherapy Pharmacist Encounter  Notified by [place] that medication has to be filled at [facility]. E-scribing Rx Surveyor, quantity.   Supportive information sent along the Rx. Will f/u patient access to medication.   Darl Pikes, PharmD, BCPS Hematology/Oncology Clinical Pharmacist [facility] [phone]  10/19/2016 8:54 AM

## 2016-10-20 ENCOUNTER — Inpatient Hospital Stay: Payer: BLUE CROSS/BLUE SHIELD

## 2016-10-20 ENCOUNTER — Inpatient Hospital Stay: Payer: BLUE CROSS/BLUE SHIELD | Admitting: Hematology and Oncology

## 2016-10-20 ENCOUNTER — Ambulatory Visit: Payer: BLUE CROSS/BLUE SHIELD | Admitting: Physical Therapy

## 2016-10-20 NOTE — Progress Notes (Deleted)
Jessica Clinic day:  10/20/2016   Chief Complaint: Jessica [name] is a 61 y.o. [name] with a glioblastoma who is seen for 3 week assessment following completion of concurrent Temodar and radiation.  HPI:  The patient was last seen in the medical oncology clinic on 10/12/2016.  At that time, she felt "ok".  She was on decadron 4 mg BID.  She was having more difficulties at home.  She had trouble seeing out of both eyes; "I see shadows".  She required increased assistance with ADLs (dress, toilet, bath).  She denied any seizures.  She has had problems with loose bowels.  She was not taking Septra prophylaxis.  We discussed ongoing treatment.  She was scheduled for chemotherapy class.  She was admitted to [facility] on 09/x/2018 secondary to increased confusion and agitation with elements of psychosis.  Agitated delerium and psychosis was felt secondary to steroids vs disease progression vs possible seizures.  Psychiatry was consulted.  She was started on Zyprexa 5 mg q 12 hours and q 8 hrs prn, Depakote 500 mg po q 8 hrs.  Ativan, Trazadone, Ambien, Risperdal were held.  She had an acute E coli UTI.  She was treated with Zosyn.   Past Medical History:  Diagnosis Date  . Asthma    Pt does not use inhaler   . Elevated cholesterol   . Glioblastoma ([place])   . History of chicken pox   . History of measles   . History of mumps     Past Surgical History:  Procedure Laterality Date  . CT Scan of head  2009   negative  . FOOT SURGERY    . LAPAROSCOPY     abdominal endometriosis  . TUBAL LIGATION  1984    Family History  Problem Relation Age of Onset  . Breast cancer Mother 51  . Depression Mother   . Hyperlipidemia Mother   . Hypothyroidism Mother   . Hypertension Mother   . Diabetes Father   . Hypertension Father   . Hypertension Brother   . Hypertension Sister     Social History:  reports that she has never smoked. She has never used smokeless  tobacco. She reports that she drinks alcohol. She reports that she does not use drugs.  She has 2 children (age 31 and 50).  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  She lives in [place].  The patient is accompanied by her husband Yvone Neu) and her mother today.  Allergies:  Allergies  Allergen Reactions  . Augmentin [Amoxicillin-Pot Clavulanate] Nausea And Vomiting  . Compazine  [Prochlorperazine]     swelling, SOB.  Marland Kitchen Oxycodone Nausea Only    Current Medications: Current Outpatient Prescriptions  Medication Sig Dispense Refill  . acetaminophen (TYLENOL) 325 MG tablet Take 975 mg by mouth every 6 (six) hours as needed.    . ALPRAZolam (XANAX) 1 MG tablet Take 1 tablet (1 mg total) by mouth daily as needed for anxiety. 30 tablet 0  . atorvastatin (LIPITOR) 40 MG tablet TAKE 1 TABLET DAILY 90 tablet 4  . bacitracin 500 UNIT/GM ointment Apply 1 application topically daily.    Marland Kitchen buPROPion (WELLBUTRIN XL) 300 MG 24 hr tablet Take 1 tablet (300 mg total) by mouth daily. 90 tablet 3  . busPIRone (BUSPAR) 5 MG tablet Take 1 tablet (5 mg total) by mouth 3 (three) times daily. 270 tablet 3  . dexamethasone (DECADRON) 4 MG tablet  Take 4 mg by mouth 2 (two) times daily.    . fluconazole (DIFLUCAN) 100 MG tablet Take 1 tablet (100 mg total) by mouth daily. (Patient not taking: Reported on 10/12/2016) 5 tablet 0  . fluticasone (FLONASE) 50 MCG/ACT nasal spray SHAKE LIQUID AND USE 2 SPRAYS IN EACH NOSTRIL DAILY 16 g 5  . HYDROcodone-homatropine (HYCODAN) 5-1.5 MG/5ML syrup 5 ml 4-6 hours as needed for cough (Patient not taking: Reported on 07/07/2016) 240 mL 0  . mometasone (NASONEX) 50 MCG/ACT nasal spray Place 2 sprays into the nose daily.    . montelukast (SINGULAIR) 10 MG tablet Take 10 mg by mouth at bedtime.    Marland Kitchen omeprazole (PRILOSEC) 20 MG capsule Take 1 capsule (20 mg total) by mouth daily. 90 capsule 3  . ondansetron (ZOFRAN-ODT) 8 MG disintegrating tablet Take 1 tablet (8 mg  total) by mouth every 8 (eight) hours as needed for nausea or vomiting. 30 tablet 1  . polyethylene glycol (MIRALAX / GLYCOLAX) packet Take 17 g by mouth daily as needed.    . potassium chloride SA (K-DUR,KLOR-CON) 20 MEQ tablet Take 1 tablet (20 mEq total) by mouth daily. (Patient not taking: Reported on 09/21/2016) 7 tablet 0  . risperiDONE (RISPERDAL) 0.5 MG tablet Take 0.5 mg by mouth at bedtime.     . sertraline (ZOLOFT) 25 MG tablet Take 25 mg by mouth daily.     . solifenacin (VESICARE) 10 MG tablet Take 10 mg by mouth daily.     Marland Kitchen sulfamethoxazole-trimethoprim (BACTRIM DS,SEPTRA DS) 800-160 MG tablet Take 1 tablet by mouth 3 (three) times a week. 30 tablet 0  . temozolomide (TEMODAR) 100 MG capsule Take 300 mg a day for 5 days every 28 days.  May take on an empty stomach or at bedtime to decrease nausea & vomiting. 15 capsule 0  . traZODone (DESYREL) 50 MG tablet Take 50-100 mg by mouth at bedtime.    Marland Kitchen zolpidem (AMBIEN) 5 MG tablet TAKE 1 TABLET BY MOUTH EVERY NIGHT AT BEDTIME AS NEEDED FOR INSOMNIA (Patient not taking: Reported on 07/07/2016) 30 tablet 5   No current facility-administered medications for this visit.     Review of Systems:  GENERAL:  Feels "ok".  No fevers or sweats. Weight up 2 pounds since last visit.  PERFORMANCE STATUS (ECOG):  3. HEENT:  Bilateral vision loss; seeing shadows.  Nasal congestion. Ears "stopped up". No sore throat, mouth sores or tenderness. Lungs: No shortness of breath or cough.  No hemoptysis. Cardiac:  No chest pain, palpitations, orthopnea, or PND. GI:  No nausea, vomiting, diarrhea, constipation, melena or hematochezia.  Pain pills cause emesis. GU:  No urgency, frequency, dysuria, or hematuria. Musculoskeletal:  No back pain.  No joint pain.  No muscle tenderness. Extremities:  Legs weak.  No pain or swelling. Skin:  Decubitus ulcer, resolved.  No other rashes or skin changes. Neuro:  Left homonymous hemianopsia.  Further decline in vision.   Unsteady on feet.  No headache or focal numbness. Endocrine:  No diabetes, thyroid issues, hot flashes or night sweats. Psych:  No mood changes, depression or anxiety. Pain:  No focal pain. Review of systems:  All other systems reviewed and found to be negative.  Physical Exam: There were no vitals taken for this visit. GENERAL:  Well developed, well nourished, woman sitting comfortably in the exam room in no acute distress.  She requires a 2 person assist onto the exam table.   MENTAL STATUS:  Alert and oriented  to person, place and time. HEAD:  Shoulder length light brown hair.  Face full.  Cushingoid features. Normocephalic, atraumatic, and symmetric.  EYES:  Glasses.  Blue eyes.  Pupils equal round and reactive to light and accomodation.  No conjunctivitis or scleral icterus. ENT:  No oral lesions. No thrush.  Mucous membranes moist.  RESPIRATORY:  Clear to auscultation without rales, wheezes or rhonchi. CARDIOVASCULAR:  Regular rate and rhythm without murmur, rub or gallop. ABDOMEN:  Soft, non-tender, with active bowel sounds, and no hepatosplenomegaly.  No masses. SKIN:  No rashes or ulcers.  EXTREMITIES: No edema, no skin discoloration or tenderness.  No palpable cords. LYMPH NODES: No palpable cervical, supraclavicular, axillary or inguinal adenopathy  NEUROLOGICAL: Cranial nerves II-XII intact except for markedly decreased vision; motor strength 5/5 throughout; sensation intact;  difficult with multi-step commends; finger to nose and RAM difficult; gait wide based and shuffling; no clonus or Babinski.  PSYCH:  Appropriate.   No visits with results within 3 Day(s) from this visit.  Latest known visit with results is:  Admission on 10/15/2016, Discharged on 10/15/2016  Component Date Value Ref Range Status  . WBC 10/15/2016 6.2  3.6 - 11.0 K/uL Final  . RBC 10/15/2016 4.17  3.80 - 5.20 MIL/uL Final  . Hemoglobin 10/15/2016 14.1  12.0 - 16.0 g/dL Final  . HCT 10/15/2016 40.1  35.0  - 47.0 % Final  . MCV 10/15/2016 96.0  80.0 - 100.0 fL Final  . MCH 10/15/2016 33.9  26.0 - 34.0 pg Final  . MCHC 10/15/2016 35.3  32.0 - 36.0 g/dL Final  . RDW 10/15/2016 15.9* 11.5 - 14.5 % Final  . Platelets 10/15/2016 161  150 - 440 K/uL Final  . Sodium 10/15/2016 136  135 - 145 mmol/L Final  . Potassium 10/15/2016 3.9  3.5 - 5.1 mmol/L Final  . Chloride 10/15/2016 100* 101 - 111 mmol/L Final  . CO2 10/15/2016 26  22 - 32 mmol/L Final  . Glucose, Bld 10/15/2016 145* 65 - 99 mg/dL Final  . BUN 10/15/2016 13  6 - 20 mg/dL Final  . Creatinine, Ser 10/15/2016 0.82  0.44 - 1.00 mg/dL Final  . Calcium 10/15/2016 9.7  8.9 - 10.3 mg/dL Final  . Total Protein 10/15/2016 6.4* 6.5 - 8.1 g/dL Final  . Albumin 10/15/2016 3.9  3.5 - 5.0 g/dL Final  . AST 10/15/2016 16  15 - 41 U/L Final  . ALT 10/15/2016 26  14 - 54 U/L Final  . Alkaline Phosphatase 10/15/2016 61  38 - 126 U/L Final  . Total Bilirubin 10/15/2016 2.1* 0.3 - 1.2 mg/dL Final  . GFR calc non Af Amer 10/15/2016 >60  >60 mL/min Final  . GFR calc Af Amer 10/15/2016 >60  >60 mL/min Final   Comment: (NOTE) The eGFR has been calculated using the CKD EPI equation. This calculation has not been validated in all clinical situations. eGFR's persistently <60 mL/min signify possible Chronic Kidney Disease.   . Anion gap 10/15/2016 10  5 - 15 Final  . Tricyclic, Ur Screen 51/88/4166 NONE DETECTED  NONE DETECTED Final  . Amphetamines, Ur Screen 10/15/2016 NONE DETECTED  NONE DETECTED Final  . MDMA (Ecstasy)Ur Screen 10/15/2016 NONE DETECTED  NONE DETECTED Final  . Cocaine Metabolite,Ur [place] 10/15/2016 NONE DETECTED  NONE DETECTED Final  . Opiate, Ur Screen 10/15/2016 NONE DETECTED  NONE DETECTED Final  . Phencyclidine (PCP) Ur S 10/15/2016 NONE DETECTED  NONE DETECTED Final  . Cannabinoid 50 Ng, Ur [place] 10/15/2016  NONE DETECTED  NONE DETECTED Final  . Barbiturates, Ur Screen 10/15/2016 NONE DETECTED  NONE DETECTED Final  . Benzodiazepine, Ur  Scrn 10/15/2016 POSITIVE* NONE DETECTED Final  . Methadone Scn, Ur 10/15/2016 NONE DETECTED  NONE DETECTED Final   Comment: (NOTE) 628  Tricyclics, urine               Cutoff 1000 ng/mL 200  Amphetamines, urine             Cutoff 1000 ng/mL 300  MDMA (Ecstasy), urine           Cutoff 500 ng/mL 400  Cocaine Metabolite, urine       Cutoff 300 ng/mL 500  Opiate, urine                   Cutoff 300 ng/mL 600  Phencyclidine (PCP), urine      Cutoff 25 ng/mL 700  Cannabinoid, urine              Cutoff 50 ng/mL 800  Barbiturates, urine             Cutoff 200 ng/mL 900  Benzodiazepine, urine           Cutoff 200 ng/mL 1000 Methadone, urine                Cutoff 300 ng/mL 1100 1200 The urine drug screen provides only a preliminary, unconfirmed 1300 analytical test result and should not be used for non-medical 1400 purposes. Clinical consideration and professional judgment should 1500 be applied to any positive drug screen result due to possible 1600 interfering substances. A more specific alternate chemical method 1700 must be used in order to obtain a confirmed analytical result.  1800 Gas chromato                          graphy / mass spectrometry (GC/MS) is the preferred 1900 confirmatory method.     Assessment:  LAURAL EILAND is a 62 y.o. 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    Head MRI at [facility] on 07/01/2016 revealed a 5.7 x 4.5 cm heterogeneously enhancing T2 hyperintense lesion in the right is posterior medial temporal lobe with extension into the adjacent right parietal and occipital lobes.  There was a separate enhancing component along the ependymal surface at the body of the right lateral ventricle extending into the centrum semiovale. There was marked mass effect on the atrium and occipital horn of the right lateral ventricle with slight increase in dilation of the temporal horn of the right lateral ventricle, consistent with  at least partial entrapment. There was stable dilation of the left lateral ventricle.  There was no hemorrhage or large acute cortical infarction.   Head MRI at [facility] on 07/02/2016 revealed substantial resection of the mass in the right cerebral hemisphere with residual disease disease. There was T2/FLAIR signal hyperintensity along the margins of the resection cavity. There was focal masslike T2/FLAIR signal hyperintensity and enhancement in the right frontal periventricular white matter, and within the splenium of the corpus callosum compatible with residual disease. There was enhancement along the margins of the resection cavity, which appeared nodular at the posterolateral aspect.  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  She received cranial radiation from 08/06/2016 - 09/25/2016.  She received daily concurrent Temodar (08/10/2016 - 09/25/2016).  She was on Bactrim prophylaxis.  She is on Decadron 4  mg BID.  Head MRI at [facility] on 10/08/2016 revealed significant interval progression of disease including increased nodular enhancing components along the resection cavity with extension past midline. There was focal rim-enhancing lesion and she is 1.6 cm compared to 1 cm) along the right frontal periventricular region concerning for additional distant residual disease. Findings were felt consistent with recent radiation therapy versus true disease progression.  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  Symptomatically, she has progressive vision problems.  She needs assistance with her ADLs.  Exam reveals Cushingoid features and marked decline in vision.  She has persistent lymphocytopenia s/p Temodar.  Bilirubin is elevated.   Plan: 1.  Labs today:  CBC with diff, CMP. 2.  Discuss interim events and recent head MRI.  Discuss progression versus changes in radiation field.  Review images with radiation oncology.  Discuss initiation of Avastin with either Temodar or lomustine.   Side effects reviewed. 3.  Continue Septra DS 1 tablet every Monday, Wednesday, Friday for PCP prophylaxis.  Continue until resolution of lymphopenia.  Encouraged patient to restart.  4.  Continue Decadron 4 mg BID.  Begin taper once Avastin started.  Discuss with radiation oncology.  5.  Phone follow up with Dr. Wynetta Emery regarding treatment plan. 6.  Preauthorize Avastin.   7.  Schedule for next chemotherapy class.  8.  RTC in 1 week for MD assessment and labs (CBC with diff, CMP, urine protein for pre-Avastin) and Avastin.   Melissa C. Mike Gip, MD  10/20/2016, 5:36 AM

## 2016-10-22 ENCOUNTER — Ambulatory Visit: Payer: BLUE CROSS/BLUE SHIELD | Admitting: Physical Therapy

## 2016-10-26 ENCOUNTER — Telehealth: Payer: Self-pay | Admitting: Pharmacist

## 2016-10-26 NOTE — Telephone Encounter (Signed)
Oral Chemotherapy Pharmacist Encounter  Patient Advocate Tonye Pearson called [place] the filling pharmacy for the Temodar prescription. They were able to process prescription but were taking to get a hold of patient to set up medication shipment.   Called [facility] to check on prescription and they were able to contact patient on 9/20. Medication will be delivered to patient on 9/25.   Thank you,  Darl Pikes, PharmD, BCPS Hematology/Oncology Clinical Pharmacist [facility] [phone]  10/26/2016 3:02 PM

## 2016-10-27 ENCOUNTER — Ambulatory Visit: Payer: BLUE CROSS/BLUE SHIELD | Admitting: Physical Therapy

## 2016-10-29 ENCOUNTER — Ambulatory Visit: Payer: BLUE CROSS/BLUE SHIELD | Admitting: Physical Therapy

## 2016-10-29 ENCOUNTER — Ambulatory Visit
Admission: RE | Admit: 2016-10-29 | Discharge: 2016-10-29 | Disposition: A | Discharge status: Home / Self Care | Payer: BLUE CROSS/BLUE SHIELD | Source: Ambulatory Visit | Attending: Radiation Oncology | Admitting: Radiation Oncology

## 2016-10-29 ENCOUNTER — Other Ambulatory Visit: Payer: Self-pay | Admitting: *Deleted

## 2016-10-29 ENCOUNTER — Inpatient Hospital Stay: Payer: BLUE CROSS/BLUE SHIELD

## 2016-10-29 ENCOUNTER — Encounter: Payer: Self-pay | Admitting: Radiation Oncology

## 2016-10-29 VITALS — BP 134/86 | HR 95 | Temp 98.6°F | Resp 18

## 2016-10-29 DIAGNOSIS — Z923 Personal history of irradiation: Secondary | ICD-10-CM | POA: Insufficient documentation

## 2016-10-29 DIAGNOSIS — R5383 Other fatigue: Secondary | ICD-10-CM | POA: Insufficient documentation

## 2016-10-29 DIAGNOSIS — R410 Disorientation, unspecified: Secondary | ICD-10-CM | POA: Diagnosis not present

## 2016-10-29 DIAGNOSIS — C719 Malignant neoplasm of brain, unspecified: Secondary | ICD-10-CM | POA: Insufficient documentation

## 2016-10-29 MED ORDER — FLUCONAZOLE 200 MG PO TABS: 200.0000 mg | ORAL_TABLET | Freq: Every day | ORAL | 0 refills | Status: DC

## 2016-10-29 NOTE — Progress Notes (Signed)
Radiation Oncology Follow up Note  Name: Jessica [name]   Date:   10/29/2016 MRN:  [id number] DOB: 05-Aug-1955    This 61 y.o. female presents to the clinic today for one-month follow-up status post radiation therapy for GBM with concurrent Temodar therapy.  REFERRING PROVIDER: Kirk Ruths, MD  HPI: patient is a 61 year old female now seen out 1 month having completed radiation therapy plus Temodar for GBM.she is been doing fair. She's had issues with altered mental status confusion and disorientation has been evaluated at [facility] with repeat MRI scans. Repeat MRI scan on 10/08/2016 showed interval progression disease with nodular enhancement component along the resection cavity with extension past the midline all areas included her treatment fields.she has been started onBevacizumab 10 mg/kg q14 days:   A cycle is every 14 days:she is doing fair at this time. Continues to have problems with disorientation fatigue.  COMPLICATIONS OF TREATMENT: none  FOLLOW UP COMPLIANCE: keeps appointments   PHYSICAL EXAM:  BP 134/86   Pulse 95   Temp 98.6 F (37 C)   Resp 18  Crude visual fields apparently are in normal range. She is hesitant to abate certain commands. Motor sensory and DTR levels appear equal and symmetric. Well-developed well-nourished patient in NAD. HEENT reveals PERLA, EOMI, discs not visualized.  Oral cavity is clear. No oral mucosal lesions are identified. Neck is clear without evidence of cervical or supraclavicular adenopathy. Lungs are clear to A&P. Cardiac examination is essentially unremarkable with regular rate and rhythm without murmur rub or thrill. Abdomen is benign with no organomegaly or masses noted. Motor sensory and DTR levels are equal and symmetric in the upper and lower extremities. Cranial nerves II through XII are grossly intact. Proprioception is intact. No peripheral adenopathy or edema is identified. No motor or sensory levels are noted. Crude visual fields  are within normal range.  RADIOLOGY RESULTS: I have reviewed her MRI scans from [facility] they do show involved field progression versus radiation changes  PLAN: this time I'll turn follow-up care over to medical oncology. She continues on the close care withBevacizumab . I will be happy to reevaluate the patient any time should further ration therapy be indicated.  I would like to take this opportunity to thank you for allowing me to participate in the care of your patient.Armstead Peaks., MD

## 2016-10-30 ENCOUNTER — Encounter: Payer: Self-pay | Admitting: Hematology and Oncology

## 2016-10-30 ENCOUNTER — Other Ambulatory Visit: Payer: Self-pay | Admitting: *Deleted

## 2016-10-30 ENCOUNTER — Inpatient Hospital Stay: Payer: BLUE CROSS/BLUE SHIELD

## 2016-10-30 ENCOUNTER — Inpatient Hospital Stay (HOSPITAL_BASED_OUTPATIENT_CLINIC_OR_DEPARTMENT_OTHER): Payer: BLUE CROSS/BLUE SHIELD | Admitting: Hematology and Oncology

## 2016-10-30 ENCOUNTER — Other Ambulatory Visit: Payer: Self-pay | Admitting: Hematology and Oncology

## 2016-10-30 VITALS — BP 155/93 | HR 98 | Temp 96.7°F | Resp 18 | Wt 168.1 lb

## 2016-10-30 DIAGNOSIS — R410 Disorientation, unspecified: Secondary | ICD-10-CM | POA: Diagnosis not present

## 2016-10-30 DIAGNOSIS — R5383 Other fatigue: Secondary | ICD-10-CM | POA: Diagnosis not present

## 2016-10-30 DIAGNOSIS — Z923 Personal history of irradiation: Secondary | ICD-10-CM | POA: Diagnosis not present

## 2016-10-30 DIAGNOSIS — H53462 Homonymous bilateral field defects, left side: Secondary | ICD-10-CM

## 2016-10-30 DIAGNOSIS — C719 Malignant neoplasm of brain, unspecified: Secondary | ICD-10-CM | POA: Diagnosis not present

## 2016-10-30 DIAGNOSIS — Z7189 Other specified counseling: Secondary | ICD-10-CM

## 2016-10-30 DIAGNOSIS — Z5112 Encounter for antineoplastic immunotherapy: Secondary | ICD-10-CM | POA: Insufficient documentation

## 2016-10-30 DIAGNOSIS — D72810 Lymphocytopenia: Secondary | ICD-10-CM

## 2016-10-30 DIAGNOSIS — C718 Malignant neoplasm of overlapping sites of brain: Secondary | ICD-10-CM | POA: Diagnosis not present

## 2016-10-30 LAB — COMPREHENSIVE METABOLIC PANEL
ALT: 49 U/L (ref 14–54)
AST: 22 U/L (ref 15–41)
Albumin: 4 g/dL (ref 3.5–5.0)
Alkaline Phosphatase: 60 U/L (ref 38–126)
Anion gap: 12 (ref 5–15)
BUN: 12 mg/dL (ref 6–20)
CO2: 26 mmol/L (ref 22–32)
Calcium: 9.3 mg/dL (ref 8.9–10.3)
Chloride: 95 mmol/L — ABNORMAL LOW (ref 101–111)
Creatinine, Ser: 0.83 mg/dL (ref 0.44–1.00)
GFR calc Af Amer: >60 mL/min (ref >60)
GFR calc non Af Amer: >60 mL/min (ref >60)
Glucose, Bld: 172 mg/dL — ABNORMAL HIGH (ref 65–99)
Potassium: 3.6 mmol/L (ref 3.5–5.1)
Sodium: 133 mmol/L — ABNORMAL LOW (ref 135–145)
Total Bilirubin: 1.3 mg/dL — ABNORMAL HIGH (ref 0.3–1.2)
Total Protein: 6.4 g/dL — ABNORMAL LOW (ref 6.5–8.1)

## 2016-10-30 LAB — CBC WITH DIFFERENTIAL/PLATELET
Basophils Absolute: 0.1 10*3/uL (ref 0–0.1)
Basophils Relative: 1 %
Eosinophils Absolute: 0 10*3/uL (ref 0–0.7)
Eosinophils Relative: 0 %
HCT: 39.4 % (ref 35.0–47.0)
Hemoglobin: 14.3 g/dL (ref 12.0–16.0)
Lymphocytes Relative: 6 %
Lymphs Abs: 0.5 10*3/uL — ABNORMAL LOW (ref 1.0–3.6)
MCH: 34.4 pg — ABNORMAL HIGH (ref 26.0–34.0)
MCHC: 36.2 g/dL — ABNORMAL HIGH (ref 32.0–36.0)
MCV: 95.1 fL (ref 80.0–100.0)
Monocytes Absolute: 0.8 10*3/uL (ref 0.2–0.9)
Monocytes Relative: 8 %
Neutro Abs: 8.2 10*3/uL — ABNORMAL HIGH (ref 1.4–6.5)
Neutrophils Relative %: 85 %
Platelets: 146 10*3/uL — ABNORMAL LOW (ref 150–440)
RBC: 4.15 MIL/uL (ref 3.80–5.20)
RDW: 14.6 % — ABNORMAL HIGH (ref 11.5–14.5)
WBC: 9.5 10*3/uL (ref 3.6–11.0)

## 2016-10-30 MED ORDER — BEVACIZUMAB CHEMO INJECTION 400 MG/16ML
10.0000 mg/kg | Freq: Once | INTRAVENOUS | Status: AC
  Administered 2016-10-30: 800 mg via INTRAVENOUS
  Filled 2016-10-30: qty 32

## 2016-10-30 MED ORDER — SODIUM CHLORIDE 0.9 % IV SOLN
Freq: Once | INTRAVENOUS | Status: AC
  Administered 2016-10-30: 10:00:00 via INTRAVENOUS
  Filled 2016-10-30: qty 1000

## 2016-10-30 NOTE — Progress Notes (Signed)
Patient presents in wheelchair this morning, accompanied by her husband.  She sits with her eyes closed.  Answers question appropriately.  Seems confused otherwise (talking about hearing aids, babies in the room, etc.)  BP 155/93 HR 98

## 2016-10-30 NOTE — Progress Notes (Signed)
Jessica [name] day:  10/30/2016   Chief Complaint: Jessica [name] is a 61 y.o. female with a glioblastoma who is seen for assessment following hospital discharge from [facility] and prior to initiation of cycle #1 Avastin.  HPI:  The patient was last seen in the medical oncology [name] on 10/12/2016.  At that time, she felt "ok".  She was on Decadron 4 mg BID.  She was having more difficulties at home.  She had trouble seeing out of both eyes; "I see shadows".  She required increased assistance with ADLs (dress, toilet, bath).  She denied any seizures.  She has had problems with loose bowels.  She was not taking Septra prophylaxis.  We discussed ongoing treatment.  She was scheduled for chemotherapy class.  She was admitted to [facility] from 10/15/2016 - 10/23/2016 secondary to increased confusion and agitation with elements of psychosis.  She was found to have an Escherichia coli urinary tract infection as well as steroid-induced psychosis.  Symptoms improved with treatment of her UTI and decrease steroid dose. EEG was negative for seizures. CT scan revealed no edema. Psychiatry was consulted. She was placed on Zyprexa and Depakote control agitation. Zyprexa was weaned off by discharge. Depakote was decreased to 500 mg twice a day. Plan was to start trazodone and Risperdal at home.  Decadron was decreased to 2 mg twice a day  She is somnolent in the [name] today. She is slurring her speech. Husband reports that her ability to participate in her activities of daily living has decreased. He states, "I am having to help here with everything".  She has physical therapy services that are scheduled to start this week. She continues on Decadron 4 mg BID, however she is only supposed to be taking 14m BID. She continues on prophylactic Septra as prescribed. She has continued confusion. She has experiencing auditory and visual hallucinations despite antipsychotic medications.  Patient  eating "ok", however her weight has decreased by 10 pounds since last visit. She has thrush. PCP was prescribed Fluconazole.    Past Medical History:  Diagnosis Date  . Asthma    Pt does not use inhaler   . Elevated cholesterol   . [place]   . History of chicken pox   . History of measles   . History of mumps     Past Surgical History:  Procedure Laterality Date  . CT Scan of head  2009   negative  . FOOT SURGERY    . LAPAROSCOPY     abdominal endometriosis  . TUBAL LIGATION  1984    Family History  Problem Relation Age of Onset  . Breast cancer Mother 661 . Depression Mother   . Hyperlipidemia Mother   . Hypothyroidism Mother   . Hypertension Mother   . Diabetes Father   . Hypertension Father   . Hypertension Brother   . Hypertension Sister     Social History:  reports that she has never smoked. She has never used smokeless tobacco. She reports that she drinks alcohol. She reports that she does not use drugs.  She has 2 children (age 6343and 380.  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  Allergies:  Allergies  Allergen Reactions  . Augmentin [Amoxicillin-Pot Clavulanate] Nausea And Vomiting  . Compazine  [Prochlorperazine]     swelling, SOB.  .Marland KitchenOxycodone  Nausea Only    Current Medications: Current Outpatient Prescriptions  Medication Sig Dispense Refill  . acetaminophen (TYLENOL) 325 MG tablet Take 975 mg by mouth every 6 (six) hours as needed.    . ALPRAZolam (XANAX) 1 MG tablet Take 1 tablet (1 mg total) by mouth daily as needed for anxiety. 30 tablet 0  . atorvastatin (LIPITOR) 40 MG tablet TAKE 1 TABLET DAILY 90 tablet 4  . bacitracin 500 UNIT/GM ointment Apply 1 application topically daily.    Marland Kitchen buPROPion (WELLBUTRIN XL) 300 MG 24 hr tablet Take 1 tablet (300 mg total) by mouth daily. 90 tablet 3  . busPIRone (BUSPAR) 5 MG tablet Take 1 tablet (5  mg total) by mouth 3 (three) times daily. 270 tablet 3  . cefUROXime (CEFTIN) 250 MG tablet     . dexamethasone (DECADRON) 2 MG tablet Take by mouth.    . divalproex (DEPAKOTE) 500 MG DR tablet Take 573m (one tablet) twice daily through 9/24, then decrease to 5075m(one tablet) nightly 9/25-9/28, then stop.    . divalproex (DEPAKOTE) 500 MG DR tablet     . fluconazole (DIFLUCAN) 200 MG tablet Take 1 tablet (200 mg total) by mouth daily. 3 tablet 0  . fluticasone (FLONASE) 50 MCG/ACT nasal spray SHAKE LIQUID AND USE 2 SPRAYS IN EACH NOSTRIL DAILY 16 g 5  . mometasone (NASONEX) 50 MCG/ACT nasal spray Place 2 sprays into the nose daily.    . montelukast (SINGULAIR) 10 MG tablet Take 10 mg by mouth at bedtime.    . Marland Kitchenmeprazole (PRILOSEC) 20 MG capsule Take 1 capsule (20 mg total) by mouth daily. 90 capsule 3  . ondansetron (ZOFRAN-ODT) 8 MG disintegrating tablet Take 1 tablet (8 mg total) by mouth every 8 (eight) hours as needed for nausea or vomiting. 30 tablet 1  . polyethylene glycol (MIRALAX / GLYCOLAX) packet Take 17 g by mouth daily as needed.    . risperiDONE (RISPERDAL) 0.5 MG tablet Take 0.5 mg by mouth at bedtime.     . sertraline (ZOLOFT) 25 MG tablet Take 25 mg by mouth daily.     . solifenacin (VESICARE) 10 MG tablet Take 10 mg by mouth daily.     . Marland Kitchenulfamethoxazole-trimethoprim (BACTRIM DS,SEPTRA DS) 800-160 MG tablet Take 1 tablet by mouth 3 (three) times a week. 30 tablet 0  . temozolomide (TEMODAR) 100 MG capsule Take 300 mg a day for 5 days every 28 days.  May take on an empty stomach or at bedtime to decrease nausea & vomiting. 15 capsule 0  . traZODone (DESYREL) 50 MG tablet Take 50-100 mg by mouth at bedtime.    . Marland KitchenYDROcodone-homatropine (HYCODAN) 5-1.5 MG/5ML syrup 5 ml 4-6 hours as needed for cough (Patient not taking: Reported on 07/07/2016) 240 mL 0  . potassium chloride SA (K-DUR,KLOR-CON) 20 MEQ tablet Take 1 tablet (20 mEq total) by mouth daily. (Patient not taking: Reported  on 10/30/2016) 7 tablet 0  . zolpidem (AMBIEN) 5 MG tablet TAKE 1 TABLET BY MOUTH EVERY NIGHT AT BEDTIME AS NEEDED FOR INSOMNIA (Patient not taking: Reported on 07/07/2016) 30 tablet 5   No current facility-administered medications for this visit.     Review of Systems:  GENERAL:  Feels "ok".  No fevers or sweats. Weight down 10 pounds since last visit.  PERFORMANCE STATUS (ECOG):  3. HEENT:  Bilateral vision loss; seeing shadows.  Thrush.  No sore throat, mouth sores or tenderness. Lungs: No shortness of breath or cough.  No hemoptysis.  Cardiac:  No chest pain, palpitations, orthopnea, or PND. GI:  No nausea, vomiting, diarrhea, constipation, melena or hematochezia.  Pain pills cause emesis. GU:  No urgency, frequency, dysuria, or hematuria. Musculoskeletal:  No back pain.  No joint pain.  No muscle tenderness. Extremities:  Legs weak.  No pain or swelling. Skin:  No decubitus.  No other rashes or skin changes. Neuro:  Left homonymous hemianopsia.  Further decline in vision.  Unsteady on feet.  No headache or focal numbness. Endocrine:  No diabetes, thyroid issues, hot flashes or night sweats. Psych:  No mood changes, depression or anxiety. Pain:  No focal pain. Review of systems:  All other systems reviewed and found to be negative.  Physical Exam: Blood pressure (!) 155/93, pulse 98, temperature (!) 96.7 F (35.9 C), temperature source Tympanic, resp. rate 18, weight 168 lb 1 oz (76.2 kg). GENERAL: Chronically fatigued appearing woman sitting comfortably in a wheelchair in the exam room in no acute distress.  She requires a 2 person assist onto the exam table.   MENTAL STATUS:  Alert and oriented to person and time. HEAD:  Shoulder length light brown hair.  Cushingoid features. Normocephalic, atraumatic, and symmetric.  EYES:  Sits with eyes closed.  Glasses.  Blue eyes.  Pupils equal round and reactive to light and accomodation.  No conjunctivitis or scleral icterus. ENT:  Thrush.   Mucous membranes moist.  RESPIRATORY:  Clear to auscultation without rales, wheezes or rhonchi. CARDIOVASCULAR:  Regular rate and rhythm without murmur, rub or gallop. ABDOMEN:  Soft, non-tender, with active bowel sounds, and no hepatosplenomegaly.  No masses. SKIN:  No rashes or ulcers.  EXTREMITIES: No edema, no skin discoloration or tenderness.  No palpable cords. LYMPH NODES: No palpable cervical, supraclavicular, axillary or inguinal adenopathy  NEUROLOGICAL: Face symmetric.  Sensation intact.  Upper extremity strength symmetric.  Left leg stronger than right leg.  Bilateral patellar reflexes 1+.  No clonus or Babinski. PSYCH:  Somewhat withdrawn.   Appointment on 10/30/2016  Component Date Value Ref Range Status  . WBC 10/30/2016 9.5  3.6 - 11.0 K/uL Final  . RBC 10/30/2016 4.15  3.80 - 5.20 MIL/uL Final  . Hemoglobin 10/30/2016 14.3  12.0 - 16.0 g/dL Final   RESULT REPEATED AND VERIFIED  . HCT 10/30/2016 39.4  35.0 - 47.0 % Final   RESULT REPEATED AND VERIFIED  . MCV 10/30/2016 95.1  80.0 - 100.0 fL Final  . MCH 10/30/2016 34.4* 26.0 - 34.0 pg Final  . MCHC 10/30/2016 36.2* 32.0 - 36.0 g/dL Final  . RDW 10/30/2016 14.6* 11.5 - 14.5 % Final  . Platelets 10/30/2016 146* 150 - 440 K/uL Final  . Neutrophils Relative % 10/30/2016 85  % Final  . Neutro Abs 10/30/2016 8.2* 1.4 - 6.5 K/uL Final  . Lymphocytes Relative 10/30/2016 6  % Final  . Lymphs Abs 10/30/2016 0.5* 1.0 - 3.6 K/uL Final  . Monocytes Relative 10/30/2016 8  % Final  . Monocytes Absolute 10/30/2016 0.8  0.2 - 0.9 K/uL Final  . Eosinophils Relative 10/30/2016 0  % Final  . Eosinophils Absolute 10/30/2016 0.0  0 - 0.7 K/uL Final  . Basophils Relative 10/30/2016 1  % Final  . Basophils Absolute 10/30/2016 0.1  0 - 0.1 K/uL Final  . Sodium 10/30/2016 133* 135 - 145 mmol/L Final  . Potassium 10/30/2016 3.6  3.5 - 5.1 mmol/L Final  . Chloride 10/30/2016 95* 101 - 111 mmol/L Final  . CO2 10/30/2016 26  22 -  32 mmol/L  Final  . Glucose, Bld 10/30/2016 172* 65 - 99 mg/dL Final  . BUN 10/30/2016 12  6 - 20 mg/dL Final  . Creatinine, Ser 10/30/2016 0.83  0.44 - 1.00 mg/dL Final  . Calcium 10/30/2016 9.3  8.9 - 10.3 mg/dL Final  . Total Protein 10/30/2016 6.4* 6.5 - 8.1 g/dL Final  . Albumin 10/30/2016 4.0  3.5 - 5.0 g/dL Final  . AST 10/30/2016 22  15 - 41 U/L Final  . ALT 10/30/2016 49  14 - 54 U/L Final  . Alkaline Phosphatase 10/30/2016 60  38 - 126 U/L Final  . Total Bilirubin 10/30/2016 1.3* 0.3 - 1.2 mg/dL Final  . GFR calc non Af Amer 10/30/2016 >60  >60 mL/min Final  . GFR calc Af Amer 10/30/2016 >60  >60 mL/min Final   Comment: (NOTE) The eGFR has been calculated using the CKD EPI equation. This calculation has not been validated in all clinical situations. eGFR's persistently <60 mL/min signify possible Chronic Kidney Disease.   . Anion gap 10/30/2016 12  5 - 15 Final    Assessment:  ARORA COAKLEY is a 61 y.o. 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    Head MRI at [facility] on 07/01/2016 revealed a 5.7 x 4.5 cm heterogeneously enhancing T2 hyperintense lesion in the right is posterior medial temporal lobe with extension into the adjacent right parietal and occipital lobes.  There was a separate enhancing component along the ependymal surface at the body of the right lateral ventricle extending into the centrum semiovale. There was marked mass effect on the atrium and occipital horn of the right lateral ventricle with slight increase in dilation of the temporal horn of the right lateral ventricle, consistent with at least partial entrapment. There was stable dilation of the left lateral ventricle.  There was no hemorrhage or large acute cortical infarction.   Head MRI at [facility] on 07/02/2016 revealed substantial resection of the mass in the right cerebral hemisphere with residual disease disease. There was T2/FLAIR signal hyperintensity along the  margins of the resection cavity. There was focal masslike T2/FLAIR signal hyperintensity and enhancement in the right frontal periventricular white matter, and within the splenium of the corpus callosum compatible with residual disease. There was enhancement along the margins of the resection cavity, which appeared nodular at the posterolateral aspect.  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  She received cranial radiation from 08/06/2016 - 09/25/2016.  She received daily concurrent Temodar (08/10/2016 - 09/25/2016).  She was on Bactrim prophylaxis.  She is on Decadron 4 mg BID.  Head MRI at [facility] on 10/08/2016 revealed significant interval progression of disease including increased nodular enhancing components along the resection cavity with extension past midline. There was focal rim-enhancing lesion (1.6 cm compared to 1 cm) along the right frontal periventricular region concerning for additional distant residual disease. Findings were felt consistent with recent radiation therapy versus true disease progression.  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  She was admitted to [facility] from 10/15/2016 - 10/23/2016 secondary to increased confusion and agitation with elements of psychosis.  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   Symptomatically, she remains confused. She requires more assistance with her ADLs. Patient is having hallucinations at home. Exam reveals Cushingoid features and marked decline in vision.  She has persistent lymphocytopenia s/p Temodar.  Bilirubin is elevated at 1.3.   Plan:  1.  Labs today:  CBC with diff, CMP, urine protein. 2.  Begn cycle #1 Avastin 10 mg/kg IV every 2 weeks.  Patient and her husband consent to her treatment. 3.  Review imaging with radiation oncology.  Overlay [facility] MRI with radiation port fields.  Ensure no progression outside fields prior to proceeding with Temodar 5 day pulses every 28  days. 4.  Continue Septra DS 1 tablet every Monday, Wednesday, Friday for PCP prophylaxis.  Continue until resolution of lymphopenia.  5.  Continue Decadron. Dose needs to be decreased to 2 mg BID. Discussed with radiation oncology.  6.  Discuss Temodar. Patient has not restarted medication. Continue to hold Temodar until advised by medical oncology. 7.  Anticipate steroid taper per radiation oncology. 8.  RTC in 1 week for MD assessment and labs (CBC with diff, CMP, direct bilirubin). 9.  RTC in 2 weeks for MD assessment, labs (CBC with diff, CMP, urine protein), and cycle #2 Avastin   Melissa C. Mike Gip, MD  10/30/2016, 9:16 AM

## 2016-11-02 ENCOUNTER — Telehealth: Payer: Self-pay | Admitting: *Deleted

## 2016-11-02 NOTE — Telephone Encounter (Signed)
  She is still getting treatment (Avastin) and oral chemotherapy.  She needs assistance and services at home.  She is also a [facility] patient.  M

## 2016-11-02 NOTE — Telephone Encounter (Signed)
Asking for orders for patient to participate in their palliative care program. Reports family verbalizes need for assistance. Please advise

## 2016-11-02 NOTE — Telephone Encounter (Signed)
I completed the palliative paperwork and faxed them to [facility] this afternoon. Let me know if we need to do anything else.  Thanks, Gaspar Bidding

## 2016-11-02 NOTE — Telephone Encounter (Addendum)
Referral needed for Palliative care [facility] : Mickeal Needy or Katrina Stack [phone]  According to Patric Dykes at [facility] they only see [facility] patients for palliative care.   Please send referral to [facility]. Per Manuela Schwartz at [facility]

## 2016-11-02 NOTE — Telephone Encounter (Signed)
That is fine. Let us know exactly what they need and I will take care of it.

## 2016-11-04 ENCOUNTER — Ambulatory Visit: Payer: BLUE CROSS/BLUE SHIELD | Admitting: Physical Therapy

## 2016-11-06 ENCOUNTER — Inpatient Hospital Stay: Payer: BLUE CROSS/BLUE SHIELD | Admitting: Hematology and Oncology

## 2016-11-06 ENCOUNTER — Inpatient Hospital Stay: Payer: BLUE CROSS/BLUE SHIELD

## 2016-11-06 ENCOUNTER — Telehealth: Payer: Self-pay | Admitting: *Deleted

## 2016-11-06 NOTE — Telephone Encounter (Signed)
Husband called to report that he cannot get her out of the bed and is cancelling her appointment for today.

## 2016-11-06 NOTE — Progress Notes (Deleted)
[facility] day:  11/06/2016   Chief Complaint: Jessica [name] is a 61 y.o. female with a glioblastoma who is seen for assessment on day 8 of cycle #1 Avastin.  HPI:  The patient was last seen in the medical oncology clinic on 10/30/2016.  At that time, she was confused. She required more assistance with her ADLs.   She was having hallucinations.  She had persistent lymphocytopenia s/p Temodar.  Bilirubin was 1.3.  She was taking Decadron 4 mg BID.  Dose was decreased to 2 mg BID.  Exam revealed thrush.  She had a prescription for fluconazole which she was to have started.  She received cycle #1 Avastin.    Past Medical History:  Diagnosis Date  . Asthma    Pt does not use inhaler   . Elevated cholesterol   . Glioblastoma ([place])   . History of chicken pox   . History of measles   . History of mumps     Past Surgical History:  Procedure Laterality Date  . CT Scan of head  2009   negative  . FOOT SURGERY    . LAPAROSCOPY     abdominal endometriosis  . TUBAL LIGATION  1984    Family History  Problem Relation Age of Onset  . Breast cancer Mother 29  . Depression Mother   . Hyperlipidemia Mother   . Hypothyroidism Mother   . Hypertension Mother   . Diabetes Father   . Hypertension Father   . Hypertension Brother   . Hypertension Sister     Social History:  reports that she has never smoked. She has never used smokeless tobacco. She reports that she drinks alcohol. She reports that she does not use drugs.  She has 2 children (age 29 and 66).  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  She lives in [place].  The patient is accompanied by her husband Yvone Neu) today.  Allergies:  Allergies  Allergen Reactions  . Augmentin [Amoxicillin-Pot Clavulanate] Nausea And Vomiting  . Compazine  [Prochlorperazine]     swelling, SOB.  Marland Kitchen Oxycodone Nausea Only    Current Medications: Current Outpatient Prescriptions   Medication Sig Dispense Refill  . acetaminophen (TYLENOL) 325 MG tablet Take 975 mg by mouth every 6 (six) hours as needed.    . ALPRAZolam (XANAX) 1 MG tablet Take 1 tablet (1 mg total) by mouth daily as needed for anxiety. 30 tablet 0  . atorvastatin (LIPITOR) 40 MG tablet TAKE 1 TABLET DAILY 90 tablet 4  . bacitracin 500 UNIT/GM ointment Apply 1 application topically daily.    Marland Kitchen buPROPion (WELLBUTRIN XL) 300 MG 24 hr tablet Take 1 tablet (300 mg total) by mouth daily. 90 tablet 3  . busPIRone (BUSPAR) 5 MG tablet Take 1 tablet (5 mg total) by mouth 3 (three) times daily. 270 tablet 3  . cefUROXime (CEFTIN) 250 MG tablet     . dexamethasone (DECADRON) 2 MG tablet Take by mouth.    . divalproex (DEPAKOTE) 500 MG DR tablet Take 536m (one tablet) twice daily through 9/24, then decrease to 5040m(one tablet) nightly 9/25-9/28, then stop.    . divalproex (DEPAKOTE) 500 MG DR tablet     . fluconazole (DIFLUCAN) 200 MG tablet Take 1 tablet (200 mg total) by mouth daily. 3 tablet 0  . fluticasone (FLONASE) 50 MCG/ACT nasal spray SHAKE LIQUID AND USE 2 SPRAYS IN EACH NOSTRIL DAILY 16 g  5  . HYDROcodone-homatropine (HYCODAN) 5-1.5 MG/5ML syrup 5 ml 4-6 hours as needed for cough (Patient not taking: Reported on 07/07/2016) 240 mL 0  . mometasone (NASONEX) 50 MCG/ACT nasal spray Place 2 sprays into the nose daily.    . montelukast (SINGULAIR) 10 MG tablet Take 10 mg by mouth at bedtime.    Marland Kitchen omeprazole (PRILOSEC) 20 MG capsule Take 1 capsule (20 mg total) by mouth daily. 90 capsule 3  . ondansetron (ZOFRAN-ODT) 8 MG disintegrating tablet Take 1 tablet (8 mg total) by mouth every 8 (eight) hours as needed for nausea or vomiting. 30 tablet 1  . polyethylene glycol (MIRALAX / GLYCOLAX) packet Take 17 g by mouth daily as needed.    . potassium chloride SA (K-DUR,KLOR-CON) 20 MEQ tablet Take 1 tablet (20 mEq total) by mouth daily. (Patient not taking: Reported on 10/30/2016) 7 tablet 0  . risperiDONE  (RISPERDAL) 0.5 MG tablet Take 0.5 mg by mouth at bedtime.     . sertraline (ZOLOFT) 25 MG tablet Take 25 mg by mouth daily.     . solifenacin (VESICARE) 10 MG tablet Take 10 mg by mouth daily.     Marland Kitchen sulfamethoxazole-trimethoprim (BACTRIM DS,SEPTRA DS) 800-160 MG tablet Take 1 tablet by mouth 3 (three) times a week. 30 tablet 0  . temozolomide (TEMODAR) 100 MG capsule Take 300 mg a day for 5 days every 28 days.  May take on an empty stomach or at bedtime to decrease nausea & vomiting. 15 capsule 0  . traZODone (DESYREL) 50 MG tablet Take 50-100 mg by mouth at bedtime.    Marland Kitchen zolpidem (AMBIEN) 5 MG tablet TAKE 1 TABLET BY MOUTH EVERY NIGHT AT BEDTIME AS NEEDED FOR INSOMNIA (Patient not taking: Reported on 07/07/2016) 30 tablet 5   No current facility-administered medications for this visit.     Review of Systems:  GENERAL:  Feels "ok".  No fevers or sweats. Weight down 10 pounds since last visit.  PERFORMANCE STATUS (ECOG):  3. HEENT:  Bilateral vision loss; seeing shadows.  Thrush.  No sore throat, mouth sores or tenderness. Lungs: No shortness of breath or cough.  No hemoptysis. Cardiac:  No chest pain, palpitations, orthopnea, or PND. GI:  No nausea, vomiting, diarrhea, constipation, melena or hematochezia.  Pain pills cause emesis. GU:  No urgency, frequency, dysuria, or hematuria. Musculoskeletal:  No back pain.  No joint pain.  No muscle tenderness. Extremities:  Legs weak.  No pain or swelling. Skin:  No decubitus.  No other rashes or skin changes. Neuro:  Left homonymous hemianopsia.  Further decline in vision.  Unsteady on feet.  No headache or focal numbness. Endocrine:  No diabetes, thyroid issues, hot flashes or night sweats. Psych:  No mood changes, depression or anxiety. Pain:  No focal pain. Review of systems:  All other systems reviewed and found to be negative.  Physical Exam: There were no vitals taken for this visit. GENERAL: Chronically fatigued appearing woman sitting  comfortably in a wheelchair in the exam room in no acute distress.  She requires a 2 person assist onto the exam table.   MENTAL STATUS:  Alert and oriented to person and time. HEAD:  Shoulder length light brown hair.  Cushingoid features. Normocephalic, atraumatic, and symmetric.  EYES:  Sits with eyes closed.  Glasses.  Blue eyes.  Pupils equal round and reactive to light and accomodation.  No conjunctivitis or scleral icterus. ENT:  Thrush.  Mucous membranes moist.  RESPIRATORY:  Clear to auscultation  without rales, wheezes or rhonchi. CARDIOVASCULAR:  Regular rate and rhythm without murmur, rub or gallop. ABDOMEN:  Soft, non-tender, with active bowel sounds, and no hepatosplenomegaly.  No masses. SKIN:  No rashes or ulcers.  EXTREMITIES: No edema, no skin discoloration or tenderness.  No palpable cords. LYMPH NODES: No palpable cervical, supraclavicular, axillary or inguinal adenopathy  NEUROLOGICAL: Face symmetric.  Sensation intact.  Upper extremity strength symmetric.  Left leg stronger than right leg.  Bilateral patellar reflexes 1+.  No clonus or Babinski. PSYCH:  Somewhat withdrawn.   No visits with results within 3 Day(s) from this visit.  Latest known visit with results is:  Appointment on 10/30/2016  Component Date Value Ref Range Status  . WBC 10/30/2016 9.5  3.6 - 11.0 K/uL Final  . RBC 10/30/2016 4.15  3.80 - 5.20 MIL/uL Final  . Hemoglobin 10/30/2016 14.3  12.0 - 16.0 g/dL Final   RESULT REPEATED AND VERIFIED  . HCT 10/30/2016 39.4  35.0 - 47.0 % Final   RESULT REPEATED AND VERIFIED  . MCV 10/30/2016 95.1  80.0 - 100.0 fL Final  . MCH 10/30/2016 34.4* 26.0 - 34.0 pg Final  . MCHC 10/30/2016 36.2* 32.0 - 36.0 g/dL Final  . RDW 10/30/2016 14.6* 11.5 - 14.5 % Final  . Platelets 10/30/2016 146* 150 - 440 K/uL Final  . Neutrophils Relative % 10/30/2016 85  % Final  . Neutro Abs 10/30/2016 8.2* 1.4 - 6.5 K/uL Final  . Lymphocytes Relative 10/30/2016 6  % Final  . Lymphs  Abs 10/30/2016 0.5* 1.0 - 3.6 K/uL Final  . Monocytes Relative 10/30/2016 8  % Final  . Monocytes Absolute 10/30/2016 0.8  0.2 - 0.9 K/uL Final  . Eosinophils Relative 10/30/2016 0  % Final  . Eosinophils Absolute 10/30/2016 0.0  0 - 0.7 K/uL Final  . Basophils Relative 10/30/2016 1  % Final  . Basophils Absolute 10/30/2016 0.1  0 - 0.1 K/uL Final  . Sodium 10/30/2016 133* 135 - 145 mmol/L Final  . Potassium 10/30/2016 3.6  3.5 - 5.1 mmol/L Final  . Chloride 10/30/2016 95* 101 - 111 mmol/L Final  . CO2 10/30/2016 26  22 - 32 mmol/L Final  . Glucose, Bld 10/30/2016 172* 65 - 99 mg/dL Final  . BUN 10/30/2016 12  6 - 20 mg/dL Final  . Creatinine, Ser 10/30/2016 0.83  0.44 - 1.00 mg/dL Final  . Calcium 10/30/2016 9.3  8.9 - 10.3 mg/dL Final  . Total Protein 10/30/2016 6.4* 6.5 - 8.1 g/dL Final  . Albumin 10/30/2016 4.0  3.5 - 5.0 g/dL Final  . AST 10/30/2016 22  15 - 41 U/L Final  . ALT 10/30/2016 49  14 - 54 U/L Final  . Alkaline Phosphatase 10/30/2016 60  38 - 126 U/L Final  . Total Bilirubin 10/30/2016 1.3* 0.3 - 1.2 mg/dL Final  . GFR calc non Af Amer 10/30/2016 >60  >60 mL/min Final  . GFR calc Af Amer 10/30/2016 >60  >60 mL/min Final   Comment: (NOTE) The eGFR has been calculated using the CKD EPI equation. This calculation has not been validated in all clinical situations. eGFR's persistently <60 mL/min signify possible Chronic Kidney Disease.   . Anion gap 10/30/2016 12  5 - 15 Final    Assessment:  Jessica [name] is a 61 y.o. 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    Head MRI at [facility] on 07/01/2016 revealed a 5.7 x 4.5 cm heterogeneously  enhancing T2 hyperintense lesion in the right is posterior medial temporal lobe with extension into the adjacent right parietal and occipital lobes.  There was a separate enhancing component along the ependymal surface at the body of the right lateral ventricle extending into the centrum  semiovale. There was marked mass effect on the atrium and occipital horn of the right lateral ventricle with slight increase in dilation of the temporal horn of the right lateral ventricle, consistent with at least partial entrapment. There was stable dilation of the left lateral ventricle.  There was no hemorrhage or large acute cortical infarction.   Head MRI at [facility] on 07/02/2016 revealed substantial resection of the mass in the right cerebral hemisphere with residual disease disease. There was T2/FLAIR signal hyperintensity along the margins of the resection cavity. There was focal masslike T2/FLAIR signal hyperintensity and enhancement in the right frontal periventricular white matter, and within the splenium of the corpus callosum compatible with residual disease. There was enhancement along the margins of the resection cavity, which appeared nodular at the posterolateral aspect.  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  She received cranial radiation from 08/06/2016 - 09/25/2016.  She received daily concurrent Temodar (08/10/2016 - 09/25/2016).  She was on Bactrim prophylaxis.  She is on Decadron 4 mg BID.  Head [facility] at [facility] on 10/08/2016 revealed significant interval progression of disease including increased nodular enhancing components along the resection cavity with extension past midline. There was focal rim-enhancing lesion (1.6 cm compared to 1 cm) along the right frontal periventricular region concerning for additional distant residual disease. Findings were felt consistent with recent radiation therapy versus true disease progression.  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  She was admitted to [facility] from 10/15/2016 - 10/23/2016 secondary to increased confusion and agitation with elements of psychosis.  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   Symptomatically, she remains confused. She requires more assistance with her  ADLs. Patient is having hallucinations at home. Exam reveals Cushingoid features and marked decline in vision.  She has persistent lymphocytopenia s/p Temodar.  Bilirubin is elevated at 1.3.   Plan: 1.  Labs today:  CBC with diff, CMP, direct bilirubin. 2.  Discuss steroid taper.  Dose will be decreased in half every week as tolerated.  Decrease dose to 2 mg a day. 3.  Discuss review of imaging with [facility]. 4.  2.  Begn cycle #1 Avastin 10 mg/kg IV every 2 weeks.  Patient and her husband consent to her treatment. 3.  Review imaging with radiation oncology.  Overlay [facility] MRI with radiation port fields.  Ensure no progression outside fields prior to proceeding with Temodar 5 day pulses every 28 days. 4.  Continue Septra DS 1 tablet every Monday, Wednesday, Friday for PCP prophylaxis.  Continue until resolution of lymphopenia.  5.  Continue Decadron. Dose needs to be decreased to 2 mg BID. Discussed with radiation oncology.  6.  Discuss Temodar. Patient has not restarted medication. Continue to hold Temodar until advised by medical oncology. 7.  Anticipate steroid taper per radiation oncology. 8.  RTC in 1 week for MD assessment and labs (CBC with diff, CMP, direct bilirubin). 9.  RTC in 2 weeks for MD assessment, labs (CBC with diff, CMP, urine protein), and cycle #2 Avastin   Kaylin Schellenberg C. Mike Gip, MD  11/06/2016, 3:56 AM

## 2016-11-09 ENCOUNTER — Ambulatory Visit: Payer: BLUE CROSS/BLUE SHIELD | Admitting: Physical Therapy

## 2016-11-09 ENCOUNTER — Telehealth: Payer: Self-pay | Admitting: *Deleted

## 2016-11-09 NOTE — Telephone Encounter (Signed)
Charles called asking when Jessica [name] is to start her oral chemotherapy. She has an appointment 10/12, he cancelled her appointment last week stating he could not get her out of bed. Please advise

## 2016-11-09 NOTE — Telephone Encounter (Signed)
Jessica [name] informed of Dr Drenda Freeze response and states he will bring medicine in to appointment onFriday

## 2016-11-09 NOTE — Telephone Encounter (Signed)
  We need to see her to start her oral chemotherapy.  Labs will be checked that day.  I was hoping we would have started last week.  She is due for cycle #2 Avastin this week.  We need to try to taper her steroids.  Her medications need to be brought with her so we are confident about what she is taking.  M

## 2016-11-13 ENCOUNTER — Encounter: Payer: Self-pay | Admitting: Hematology and Oncology

## 2016-11-13 ENCOUNTER — Other Ambulatory Visit: Payer: Self-pay | Admitting: *Deleted

## 2016-11-13 ENCOUNTER — Telehealth: Payer: Self-pay | Admitting: Urgent Care

## 2016-11-13 ENCOUNTER — Inpatient Hospital Stay: Payer: BLUE CROSS/BLUE SHIELD

## 2016-11-13 ENCOUNTER — Other Ambulatory Visit: Payer: Self-pay | Admitting: Urgent Care

## 2016-11-13 ENCOUNTER — Other Ambulatory Visit: Payer: Self-pay

## 2016-11-13 ENCOUNTER — Inpatient Hospital Stay: Payer: BLUE CROSS/BLUE SHIELD | Attending: Hematology and Oncology | Admitting: Hematology and Oncology

## 2016-11-13 VITALS — BP 125/88 | HR 83 | Temp 98.3°F | Resp 12 | Ht 65.0 in

## 2016-11-13 DIAGNOSIS — R278 Other lack of coordination: Secondary | ICD-10-CM | POA: Diagnosis not present

## 2016-11-13 DIAGNOSIS — R441 Visual hallucinations: Secondary | ICD-10-CM

## 2016-11-13 DIAGNOSIS — R634 Abnormal weight loss: Secondary | ICD-10-CM | POA: Insufficient documentation

## 2016-11-13 DIAGNOSIS — F418 Other specified anxiety disorders: Secondary | ICD-10-CM

## 2016-11-13 DIAGNOSIS — R112 Nausea with vomiting, unspecified: Secondary | ICD-10-CM | POA: Diagnosis not present

## 2016-11-13 DIAGNOSIS — K5900 Constipation, unspecified: Secondary | ICD-10-CM | POA: Diagnosis not present

## 2016-11-13 DIAGNOSIS — C719 Malignant neoplasm of brain, unspecified: Secondary | ICD-10-CM

## 2016-11-13 DIAGNOSIS — D72810 Lymphocytopenia: Secondary | ICD-10-CM | POA: Diagnosis not present

## 2016-11-13 DIAGNOSIS — R82998 Other abnormal findings in urine: Secondary | ICD-10-CM | POA: Diagnosis not present

## 2016-11-13 DIAGNOSIS — R17 Unspecified jaundice: Secondary | ICD-10-CM

## 2016-11-13 DIAGNOSIS — R630 Anorexia: Secondary | ICD-10-CM | POA: Diagnosis not present

## 2016-11-13 DIAGNOSIS — R000 Tachycardia, unspecified: Secondary | ICD-10-CM | POA: Insufficient documentation

## 2016-11-13 DIAGNOSIS — H547 Unspecified visual loss: Secondary | ICD-10-CM | POA: Diagnosis not present

## 2016-11-13 DIAGNOSIS — G910 Communicating hydrocephalus: Secondary | ICD-10-CM

## 2016-11-13 DIAGNOSIS — N390 Urinary tract infection, site not specified: Secondary | ICD-10-CM

## 2016-11-13 DIAGNOSIS — Z5112 Encounter for antineoplastic immunotherapy: Secondary | ICD-10-CM | POA: Diagnosis not present

## 2016-11-13 DIAGNOSIS — R21 Rash and other nonspecific skin eruption: Secondary | ICD-10-CM | POA: Diagnosis not present

## 2016-11-13 DIAGNOSIS — Z792 Long term (current) use of antibiotics: Secondary | ICD-10-CM | POA: Insufficient documentation

## 2016-11-13 DIAGNOSIS — R440 Auditory hallucinations: Secondary | ICD-10-CM

## 2016-11-13 DIAGNOSIS — Z880 Allergy status to penicillin: Secondary | ICD-10-CM

## 2016-11-13 DIAGNOSIS — R531 Weakness: Secondary | ICD-10-CM

## 2016-11-13 DIAGNOSIS — R4182 Altered mental status, unspecified: Secondary | ICD-10-CM | POA: Diagnosis not present

## 2016-11-13 DIAGNOSIS — C718 Malignant neoplasm of overlapping sites of brain: Secondary | ICD-10-CM

## 2016-11-13 DIAGNOSIS — R451 Restlessness and agitation: Secondary | ICD-10-CM | POA: Diagnosis not present

## 2016-11-13 DIAGNOSIS — R410 Disorientation, unspecified: Secondary | ICD-10-CM | POA: Insufficient documentation

## 2016-11-13 DIAGNOSIS — Z87440 Personal history of urinary (tract) infections: Secondary | ICD-10-CM | POA: Insufficient documentation

## 2016-11-13 DIAGNOSIS — F410 Panic disorder [episodic paroxysmal anxiety] without agoraphobia: Secondary | ICD-10-CM | POA: Insufficient documentation

## 2016-11-13 DIAGNOSIS — R251 Tremor, unspecified: Secondary | ICD-10-CM | POA: Insufficient documentation

## 2016-11-13 DIAGNOSIS — J45909 Unspecified asthma, uncomplicated: Secondary | ICD-10-CM

## 2016-11-13 DIAGNOSIS — Z803 Family history of malignant neoplasm of breast: Secondary | ICD-10-CM

## 2016-11-13 DIAGNOSIS — Z923 Personal history of irradiation: Secondary | ICD-10-CM

## 2016-11-13 DIAGNOSIS — E7800 Pure hypercholesterolemia, unspecified: Secondary | ICD-10-CM

## 2016-11-13 DIAGNOSIS — Z7401 Bed confinement status: Secondary | ICD-10-CM | POA: Insufficient documentation

## 2016-11-13 DIAGNOSIS — Z7189 Other specified counseling: Secondary | ICD-10-CM

## 2016-11-13 DIAGNOSIS — Z79899 Other long term (current) drug therapy: Secondary | ICD-10-CM

## 2016-11-13 LAB — COMPREHENSIVE METABOLIC PANEL
ALT: 34 U/L (ref 14–54)
AST: 15 U/L (ref 15–41)
Albumin: 3.5 g/dL (ref 3.5–5.0)
Alkaline Phosphatase: 63 U/L (ref 38–126)
Anion gap: 10 (ref 5–15)
BUN: 10 mg/dL (ref 6–20)
CO2: 28 mmol/L (ref 22–32)
Calcium: 8.6 mg/dL — ABNORMAL LOW (ref 8.9–10.3)
Chloride: 97 mmol/L — ABNORMAL LOW (ref 101–111)
Creatinine, Ser: 0.6 mg/dL (ref 0.44–1.00)
GFR calc Af Amer: >60 mL/min (ref >60)
GFR calc non Af Amer: >60 mL/min (ref >60)
Glucose, Bld: 124 mg/dL — ABNORMAL HIGH (ref 65–99)
Potassium: 4 mmol/L (ref 3.5–5.1)
Sodium: 135 mmol/L (ref 135–145)
Total Bilirubin: 1.6 mg/dL — ABNORMAL HIGH (ref 0.3–1.2)
Total Protein: 6 g/dL — ABNORMAL LOW (ref 6.5–8.1)

## 2016-11-13 LAB — CBC WITH DIFFERENTIAL/PLATELET
Basophils Absolute: 0 10*3/uL (ref 0–0.1)
Basophils Relative: 0 %
Eosinophils Absolute: 0 10*3/uL (ref 0–0.7)
Eosinophils Relative: 0 %
HCT: 40.1 % (ref 35.0–47.0)
Hemoglobin: 14.3 g/dL (ref 12.0–16.0)
Lymphocytes Relative: 5 %
Lymphs Abs: 0.4 10*3/uL — ABNORMAL LOW (ref 1.0–3.6)
MCH: 34.2 pg — ABNORMAL HIGH (ref 26.0–34.0)
MCHC: 35.8 g/dL (ref 32.0–36.0)
MCV: 95.5 fL (ref 80.0–100.0)
Monocytes Absolute: 0.3 10*3/uL (ref 0.2–0.9)
Monocytes Relative: 4 %
Neutro Abs: 6.4 10*3/uL (ref 1.4–6.5)
Neutrophils Relative %: 91 %
Platelets: 113 10*3/uL — ABNORMAL LOW (ref 150–440)
RBC: 4.2 MIL/uL (ref 3.80–5.20)
RDW: 15 % — ABNORMAL HIGH (ref 11.5–14.5)
WBC: 7.1 10*3/uL (ref 3.6–11.0)

## 2016-11-13 LAB — URINALYSIS, COMPLETE (UACMP) WITH MICROSCOPIC
Bilirubin Urine: NEGATIVE
Glucose, UA: NEGATIVE mg/dL
Ketones, ur: NEGATIVE mg/dL
Nitrite: NEGATIVE
Protein, ur: NEGATIVE mg/dL
Specific Gravity, Urine: 1.012 (ref 1.005–1.030)
pH: 6 (ref 5.0–8.0)

## 2016-11-13 LAB — BILIRUBIN, DIRECT: Bilirubin, Direct: 0.2 mg/dL (ref 0.1–0.5)

## 2016-11-13 MED ORDER — CIPROFLOXACIN HCL 500 MG PO TABS: 500.0000 mg | ORAL_TABLET | Freq: Two times a day (BID) | ORAL | 0 refills | Status: DC

## 2016-11-13 MED ORDER — SODIUM CHLORIDE 0.9 % IV SOLN
Freq: Once | INTRAVENOUS | Status: AC
  Administered 2016-11-13: 12:00:00 via INTRAVENOUS
  Filled 2016-11-13: qty 1000

## 2016-11-13 MED ORDER — SODIUM CHLORIDE 0.9 % IV SOLN
10.0000 mg/kg | Freq: Once | INTRAVENOUS | Status: AC
  Administered 2016-11-13: 800 mg via INTRAVENOUS
  Filled 2016-11-13: qty 32

## 2016-11-13 NOTE — Progress Notes (Signed)
[facility] day:  11/13/2016   Chief Complaint: Jessica [name] is a 61 y.o. female with a glioblastoma who is seen prior to cycle #2 Avastin.  HPI:  The patient was last seen in the medical oncology clinic on 10/30/2016.  At that time, she was confused. She required more assistance with her ADLs.   She was having hallucinations.  She had persistent lymphocytopenia s/p Temodar.  Bilirubin was 1.3.  She was taking Decadron 4 mg BID.  Dose was decreased to 2 mg BID.  Exam revealed thrush.  She had a prescription for fluconazole which she was to have started.  She received cycle #1 Avastin.  She did not return for reassessment on 11/06/2016. Her husband notes that the patient was "too weak to get out of bed".  She can not have morning appointments.  Symptomatically, patient has been doing "ok". Patient more verbal today as compared to previous visits. Of note, patient speaks to provider with eyes closed. Patient requires assistance with all ADLs. Patient unable to ambulate independently. Patient out of bed to a chair "most of the day". Patient continues to experience both auditory and visual hallucinations. Patient experiencing "at least 3 anxiety attacks" a day per her mother's report.  Patient denies any B symptoms. She is eating well with assistance.   Patient unable to follow simple commands. She was asked several times to open her eyes. Patient states, "They are open", however eyes remained closed. Patient oriented to place and person. She thinks that it is "March or April of 1976". She is not able to identify the President of the [place]. Short term recall poor; patient unable to recall 3 simple words in 5 minutes. Patient able to verbalize that there are "4 quarters in a dollar", yet she also stated that there were "4 nickels in a dollar". Decreased ability to count backwards from 100 in increments of 3s.   Patient continues on Decadron 10m BID. She tolerated  her initial Avastin treatment well with no perceived side effects.    Past Medical History:  Diagnosis Date  . Asthma    Pt does not use inhaler   . Elevated cholesterol   [place]   . History of chicken pox   . History of measles   . History of mumps     Past Surgical History:  Procedure Laterality Date  . CT Scan of head  2009   negative  . FOOT SURGERY    . LAPAROSCOPY     abdominal endometriosis  . TUBAL LIGATION  1984    Family History  Problem Relation Age of Onset  . Breast cancer Mother 678 . Depression Mother   . Hyperlipidemia Mother   . Hypothyroidism Mother   . Hypertension Mother   . Diabetes Father   . Hypertension Father   . Hypertension Brother   . Hypertension Sister     Social History:  reports that she has never smoked. She has never used smokeless tobacco. She reports that she drinks alcohol. She reports that she does not use drugs.  She has 2 children (age 8854and 380.  She previously worked as a cScientist, water qualityat a bSet designer  [place]  The patient is accompanied by her husband (Yvone Neu and mother today.  Allergies:  Allergies  Allergen Reactions  . Augmentin [Amoxicillin-Pot Clavulanate] Nausea And Vomiting  . Compazine  [Prochlorperazine]     swelling, SOB.  .Marland Kitchen  Oxycodone Nausea Only    Current Medications: Current Outpatient Prescriptions  Medication Sig Dispense Refill  . acetaminophen (TYLENOL) 325 MG tablet Take 975 mg by mouth every 6 (six) hours as needed.    . ALPRAZolam (XANAX) 1 MG tablet Take 1 tablet (1 mg total) by mouth daily as needed for anxiety. 30 tablet 0  . atorvastatin (LIPITOR) 40 MG tablet TAKE 1 TABLET DAILY 90 tablet 4  . bacitracin 500 UNIT/GM ointment Apply 1 application topically daily.    Marland Kitchen buPROPion (WELLBUTRIN XL) 300 MG 24 hr tablet Take 1 tablet (300 mg total) by mouth daily. 90 tablet 3  . busPIRone (BUSPAR) 5 MG tablet Take 1 tablet (5 mg total) by mouth 3 (three) times daily. 270  tablet 3  . cefUROXime (CEFTIN) 250 MG tablet     . dexamethasone (DECADRON) 2 MG tablet Take by mouth.    . divalproex (DEPAKOTE) 500 MG DR tablet Take 584m (one tablet) twice daily through 9/24, then decrease to 509m(one tablet) nightly 9/25-9/28, then stop.    . divalproex (DEPAKOTE) 500 MG DR tablet     . fluconazole (DIFLUCAN) 200 MG tablet Take 1 tablet (200 mg total) by mouth daily. 3 tablet 0  . fluticasone (FLONASE) 50 MCG/ACT nasal spray SHAKE LIQUID AND USE 2 SPRAYS IN EACH NOSTRIL DAILY 16 g 5  . HYDROcodone-homatropine (HYCODAN) 5-1.5 MG/5ML syrup 5 ml 4-6 hours as needed for cough 240 mL 0  . mometasone (NASONEX) 50 MCG/ACT nasal spray Place 2 sprays into the nose daily.    . montelukast (SINGULAIR) 10 MG tablet Take 10 mg by mouth at bedtime.    . Marland Kitchenmeprazole (PRILOSEC) 20 MG capsule Take 1 capsule (20 mg total) by mouth daily. 90 capsule 3  . ondansetron (ZOFRAN-ODT) 8 MG disintegrating tablet Take 1 tablet (8 mg total) by mouth every 8 (eight) hours as needed for nausea or vomiting. 30 tablet 1  . polyethylene glycol (MIRALAX / GLYCOLAX) packet Take 17 g by mouth daily as needed.    . potassium chloride SA (K-DUR,KLOR-CON) 20 MEQ tablet Take 1 tablet (20 mEq total) by mouth daily. 7 tablet 0  . risperiDONE (RISPERDAL) 0.5 MG tablet Take 0.5 mg by mouth at bedtime.     . sertraline (ZOLOFT) 25 MG tablet Take 25 mg by mouth daily.     . solifenacin (VESICARE) 10 MG tablet Take 10 mg by mouth daily.     . Marland Kitchenulfamethoxazole-trimethoprim (BACTRIM DS,SEPTRA DS) 800-160 MG tablet Take 1 tablet by mouth 3 (three) times a week. 30 tablet 0  . temozolomide (TEMODAR) 100 MG capsule Take 300 mg a day for 5 days every 28 days.  May take on an empty stomach or at bedtime to decrease nausea & vomiting. 15 capsule 0  . traZODone (DESYREL) 50 MG tablet Take 50-100 mg by mouth at bedtime.    . Marland Kitchenolpidem (AMBIEN) 5 MG tablet TAKE 1 TABLET BY MOUTH EVERY NIGHT AT BEDTIME AS NEEDED FOR INSOMNIA 30  tablet 5   No current facility-administered medications for this visit.     Review of Systems:  GENERAL:  Feels "ok".  No fevers or sweats. Now new weight recorded.  PERFORMANCE STATUS (ECOG):  3. HEENT:  Bilateral vision loss.  No sore throat, mouth sores or tenderness. Lungs: No shortness of breath or cough.  No hemoptysis. Cardiac:  No chest pain, palpitations, orthopnea, or PND. GI:  No nausea, vomiting, diarrhea, constipation, melena or hematochezia.  Pain pills cause emesis.  GU:  No urgency, frequency, dysuria, or hematuria. Musculoskeletal:  No back pain.  No joint pain.  No muscle tenderness. Extremities:  Legs remain weak.  No pain or swelling. Skin:  No decubitus.  No other rashes or skin changes. Neuro:  Left homonymous hemianopsia. Decline in vision.  Unsteady on feet.  No headache or focal numbness. Endocrine:  No diabetes, thyroid issues, hot flashes or night sweats. Psych:  Anxiety attacks (confused at times).  No mood changes or depression. Pain:  No focal pain. Review of systems:  All other systems reviewed and found to be negative.  Physical Exam: Blood pressure 125/88, pulse 83, temperature 98.3 F (36.8 C), temperature source Tympanic, resp. rate 12, height 5' 5"  (1.651 m), SpO2 97 %. GENERAL: Chronically fatigued appearing woman sitting comfortably in a wheelchair in the exam room in no acute distress.  She is examined in the chair. MENTAL STATUS:  Alert and oriented to person and place.  States that it is March or April 1976. HEAD:  Shoulder length light brown hair.  Cushingoid features. Normocephalic, atraumatic, and symmetric.  EYES:  Sits with eyes closed.  Glasses.  Blue eyes.  Pupils equal round and reactive to light and accomodation.  No conjunctivitis or scleral icterus. ENT:  No oral lesions.  No thrush.  Mucous membranes moist.  RESPIRATORY:  Clear to auscultation without rales, wheezes or rhonchi. CARDIOVASCULAR:  Regular rate and rhythm without murmur,  rub or gallop. ABDOMEN:  Soft, non-tender, with active bowel sounds, and no hepatosplenomegaly.  No masses. SKIN:  No rashes or ulcers.  EXTREMITIES: No edema, no skin discoloration or tenderness.  No palpable cords. LYMPH NODES: No palpable cervical, supraclavicular, axillary or inguinal adenopathy  NEUROLOGICAL: Remembers 3 of 3 words immediately but 0 of 3 words in 5 minutes (even with prompting).  Does not know Software engineer.  4 quarters in a dollar and 4 nickels in a quarter.  Unable to count backwards from 100.  Face symmetric.  Sensation intact.  Upper extremity strength symmetric.  Able to reach and grab my hand consistently on the right.  Left leg stronger than right leg.  Bilateral patellar reflexes 1+.  No clonus or Babinski. PSYCH:  Engaged.  Talkative.   Appointment on 11/13/2016  Component Date Value Ref Range Status  . WBC 11/13/2016 7.1  3.6 - 11.0 K/uL Final  . RBC 11/13/2016 4.20  3.80 - 5.20 MIL/uL Final  . Hemoglobin 11/13/2016 14.3  12.0 - 16.0 g/dL Final  . HCT 11/13/2016 40.1  35.0 - 47.0 % Final  . MCV 11/13/2016 95.5  80.0 - 100.0 fL Final  . MCH 11/13/2016 34.2* 26.0 - 34.0 pg Final  . MCHC 11/13/2016 35.8  32.0 - 36.0 g/dL Final  . RDW 11/13/2016 15.0* 11.5 - 14.5 % Final  . Platelets 11/13/2016 113* 150 - 440 K/uL Final  . Neutrophils Relative % 11/13/2016 91  % Final  . Neutro Abs 11/13/2016 6.4  1.4 - 6.5 K/uL Final  . Lymphocytes Relative 11/13/2016 5  % Final  . Lymphs Abs 11/13/2016 0.4* 1.0 - 3.6 K/uL Final  . Monocytes Relative 11/13/2016 4  % Final  . Monocytes Absolute 11/13/2016 0.3  0.2 - 0.9 K/uL Final  . Eosinophils Relative 11/13/2016 0  % Final  . Eosinophils Absolute 11/13/2016 0.0  0 - 0.7 K/uL Final  . Basophils Relative 11/13/2016 0  % Final  . Basophils Absolute 11/13/2016 0.0  0 - 0.1 K/uL Final  . Sodium 11/13/2016 135  135 - 145 mmol/L Final  . Potassium 11/13/2016 4.0  3.5 - 5.1 mmol/L Final  . Chloride 11/13/2016 97* 101 - 111 mmol/L  Final  . CO2 11/13/2016 28  22 - 32 mmol/L Final  . Glucose, Bld 11/13/2016 124* 65 - 99 mg/dL Final  . BUN 11/13/2016 10  6 - 20 mg/dL Final  . Creatinine, Ser 11/13/2016 0.60  0.44 - 1.00 mg/dL Final  . Calcium 11/13/2016 8.6* 8.9 - 10.3 mg/dL Final  . Total Protein 11/13/2016 6.0* 6.5 - 8.1 g/dL Final  . Albumin 11/13/2016 3.5  3.5 - 5.0 g/dL Final  . AST 11/13/2016 15  15 - 41 U/L Final  . ALT 11/13/2016 34  14 - 54 U/L Final  . Alkaline Phosphatase 11/13/2016 63  38 - 126 U/L Final  . Total Bilirubin 11/13/2016 1.6* 0.3 - 1.2 mg/dL Final  . GFR calc non Af Amer 11/13/2016 >60  >60 mL/min Final  . GFR calc Af Amer 11/13/2016 >60  >60 mL/min Final   Comment: (NOTE) The eGFR has been calculated using the CKD EPI equation. This calculation has not been validated in all clinical situations. eGFR's persistently <60 mL/min signify possible Chronic Kidney Disease.   . Anion gap 11/13/2016 10  5 - 15 Final    Assessment:  Jessica [name] is a 61 y.o. 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    Head MRI at [facility] on 07/01/2016 revealed a 5.7 x 4.5 cm heterogeneously enhancing T2 hyperintense lesion in the right is posterior medial temporal lobe with extension into the adjacent right parietal and occipital lobes.  There was a separate enhancing component along the ependymal surface at the body of the right lateral ventricle extending into the centrum semiovale. There was marked mass effect on the atrium and occipital horn of the right lateral ventricle with slight increase in dilation of the temporal horn of the right lateral ventricle, consistent with at least partial entrapment. There was stable dilation of the left lateral ventricle.  There was no hemorrhage or large acute cortical infarction.   Head MRI at [facility] on 07/02/2016 revealed substantial resection of the mass in the right cerebral hemisphere with residual disease disease. There was  T2/FLAIR signal hyperintensity along the margins of the resection cavity. There was focal masslike T2/FLAIR signal hyperintensity and enhancement in the right frontal periventricular white matter, and within the splenium of the corpus callosum compatible with residual disease. There was enhancement along the margins of the resection cavity, which appeared nodular at the posterolateral aspect.  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  She received cranial radiation from 08/06/2016 - 09/25/2016.  She received daily concurrent Temodar (08/10/2016 - 09/25/2016).  She was on Bactrim prophylaxis.  She is on Decadron 4 mg BID.  Head MRI at [facility] on 10/08/2016 revealed significant interval progression of disease including increased nodular enhancing components along the resection cavity with extension past midline. There was focal rim-enhancing lesion (1.6 cm compared to 1 cm) along the right frontal periventricular region concerning for additional distant residual disease. Findings were felt consistent with recent radiation therapy versus true disease progression.  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  She was admitted to [facility] from 10/15/2016 - 10/23/2016 secondary to increased confusion and agitation with elements of psychosis.  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  Symptomatically, she requires assistance with her ADLs. She has some auditory and visual hallucinations at home. Exam reveals Cushingoid features, with a marked decrease in her overall ability to see on her left side.  She has persistent lymphocytopenia (ALC 400) s/p Temodar.  WBC is 7100 with an ANC of 6400, Hemoglobin 14.3, hematocrit 40.1, platelets 113,000. Bilirubin is elevated at 1.6 (direct 0.2).   Plan: 1.  Labs today:  CBC with diff, CMP, direct bilirubin. 2.  Discuss steroid  taper.  Dose will be decreased in half every week as tolerated.  Decrease dose to 2 mg a day. 3.  Discuss review of imaging with [facility].  Discuss plan to proceed with Temodar 358m a day x 5 days every 28 days x 2 cycles then reimage.  4.  Cycle #2 Avastin today. 5.  Continue Septra DS 1 tablet every Monday, Wednesday, Friday for PCP prophylaxis.  Continue until resolution of lymphopenia.  6.  Discuss anxiety. Potential cause is ongoing high dose steroids.  Plan to taper steroids. Reviewed [facility] notes, which indicated that Ativan should be avoided. Patient on Trazodone at home already. Family encouraged to reorient often during periods of confusion. 7.  Discuss elevated bilirubin of 1.6. 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 Direct bilirubin level added to routine labs today (direct bilirubin low).  8.  Complete handicap placard application.  9.  Discuss advanced directives. No definitive decisions have been made. Advanced directives packet provided today for family to review.  10.  Discuss low calcium. Level today is 8.6. Patient encouraged to take Calcium 12058mdaily. 11.  RTC in 1 week for MD assessment and labs (CBC with diff). 12.  RTC in 2 weeks for MD assessment, labs (CBC with diff, CMP, urine protein), and cycle #3 Avastin   BrHonor LohNP  11/13/2016, 9:53 AM   I saw and evaluated the patient, participating in the key portions of the service and reviewing pertinent diagnostic studies and records.  I reviewed the nurse practitioner's note and agree with the findings and the plan.  The assessment and plan were discussed with the patient.  Multiple questions were asked by the patient and answered.   MeNolon StallsMD 11/13/2016,9:53 AM

## 2016-11-13 NOTE — Progress Notes (Signed)
Pt informed that she will need to pick up Cipro and begin taking ASAP BID, pt and mother verbalize understanding.

## 2016-11-13 NOTE — Progress Notes (Signed)
Patient here today for follow up. Unable to stand for wt, somnolent and lethargic. See Progess note for assessment.

## 2016-11-13 NOTE — Telephone Encounter (Signed)
Pre-Avastin UA was done to check for proteinuria. Infusion nurse called this NP to advise that when the patient's urine sample was collected it was very cloudy and malodorous. Patient denies urinary symptoms at this time. UA results reviewed. Sample significant leukocytes; TNTC WBCs. Patient is already on PCP prophylaxis using Bactrim. Patient was recently admitted to [facility] for altered mental status secondary to a significant UTI. Additionally she is on both oral and intravenous chemotherapy at this time. WBC is 7.1 with an [place] of 6,400. Will send urine sample for culture and sensitivity. We'll also proceed with treatment for UTI using Cipro 500 mg by mouth twice a day 7 days. Prescription was sent to the patient's pharmacy. Plan of care discussed with patient and her family by primary infusion RN.  Patient advised to return a call to the medical oncology clinic with any new or worsening symptoms. Patient aware that further assessment is needed if she develops significant urinary symptoms, fever, or altered mental status due to her risk for significant infection due to her on chemotherapy. Plan of care discussed with Dr. Mike Gip.

## 2016-11-14 ENCOUNTER — Encounter: Payer: Self-pay | Admitting: Urgent Care

## 2016-11-17 ENCOUNTER — Ambulatory Visit: Payer: BLUE CROSS/BLUE SHIELD | Admitting: Physical Therapy

## 2016-11-17 ENCOUNTER — Telehealth: Payer: Self-pay | Admitting: *Deleted

## 2016-11-17 ENCOUNTER — Other Ambulatory Visit: Payer: Self-pay | Admitting: Urgent Care

## 2016-11-17 ENCOUNTER — Other Ambulatory Visit: Payer: Self-pay | Admitting: *Deleted

## 2016-11-17 DIAGNOSIS — N390 Urinary tract infection, site not specified: Secondary | ICD-10-CM

## 2016-11-17 NOTE — Telephone Encounter (Signed)
Left VM message for patient to call us if she is not feeling well and to let her know that we will do repeat ua and urine culture when she comes on Thurday, 11/17/16.

## 2016-11-18 NOTE — Progress Notes (Signed)
[facility] day:  11/19/2016   Chief Complaint: Jessica [name] is a 61 y.o. female with a glioblastoma who is seen for assessment on day 7 s/p cycle #1 Temodar and 1 week assessment following cycle #2 Avastin.  HPI:  The patient was last seen in the medical oncology clinic on 11/14/2006.  At that time, she was confused.  She required more assistance with her ADLs.   She was having auditory and visual hallucinations.  She was having multiple anxiety attacks.  She had persistent lymphocytopenia s/p Temodar.  Bilirubin was 1.6 (direct bili 0.2)   She was taking Decadron 2 mg BID.  She continued on PCP prophylaxis with Septra on Mondays, Wednesdays, and Fridays. She had malodorous urine. UA revealed WBCs that were TNTC. Culture was ordered, however due to lab error it was not completed. Cipro 500  twice a day was sent in.  She received cycle #2 Avastin.  Decadron was tapered to 2 mg a day.  She began 5 days of Temodar.  During the interim, patient states that she has been "good". Patient is more alert today. Her eyes are open for her assessment. Patient started her Temodar 356m on 11/16/2016. She had some nausea and vomiting the first night only. Patient is eating "a little here and there" per her husband. Weight is relatively stable. Patient has has some constipation. She continues on Decadron 2 mg day. Patient is "real emotional" during the day. Activity continues to be limited. She spends most of the day in the chair. Patient requires maximum assistance with transfers and ambulation. It is difficult for her to complete simple tasks.   Patient continues on PCP prophylaxis (Septra). At her last visit, patient was noted to have a UTI. Antibiotics were sent in, however patient never picked up the prescription. Due to lab error, C&S was not performed. Patient denies urinary symptoms. Patient has a low grade temperature (99.1) in the clinic today. Patient has an  intertriginous rash beneath both of her breasts.   Patient is disoriented.  Her thinking is delusional, as she thinks that she is "pretty healthy" and that "nothing is wrong". She thinks it is December of 1996, and that OMady Gemmais the PSoftware engineerof the [place] Patient states that there are "25 nickels in a quarter". She is able to correctly note that there are "4 quarters in a dollar". Patient unable to count backward from 100 in increments of 3. Patient unable to consistently complete the finger-nose-finger test. Her immediate recall of 3 simple words is intact. Her short term ability to recall the same 3 simple words after 5 minutes is impaired. She is unable to recall any of the words that she was asked to remember.    Past Medical History:  Diagnosis Date  . Asthma    Pt does not use inhaler   . Elevated cholesterol   . Glioblastoma ([place]   . History of chicken pox   . History of measles   . History of mumps     Past Surgical History:  Procedure Laterality Date  . CT Scan of head  2009   negative  . FOOT SURGERY    . LAPAROSCOPY     abdominal endometriosis  . TUBAL LIGATION  1984    Family History  Problem Relation Age of Onset  . Breast cancer Mother 670 . Depression Mother   . Hyperlipidemia Mother   . Hypothyroidism Mother   .  Hypertension Mother   . Diabetes Father   . Hypertension Father   . Hypertension Brother   . Hypertension Sister     Social History:  reports that she has never smoked. She has never used smokeless tobacco. She reports that she drinks alcohol. She reports that she does not use drugs.  She has 2 children (age 56 and 59).  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  She lives in [place].  The patient is accompanied by her husband Jessica [name])  today.  Allergies:  Allergies  Allergen Reactions  . Augmentin [Amoxicillin-Pot Clavulanate] Nausea And Vomiting  . Compazine  [Prochlorperazine]     swelling, SOB.  Marland Kitchen Oxycodone Nausea  Only    Current Medications: Current Outpatient Prescriptions  Medication Sig Dispense Refill  . acetaminophen (TYLENOL) 325 MG tablet Take 975 mg by mouth every 6 (six) hours as needed.    . ALPRAZolam (XANAX) 1 MG tablet Take 1 tablet (1 mg total) by mouth daily as needed for anxiety. 30 tablet 0  . atorvastatin (LIPITOR) 40 MG tablet TAKE 1 TABLET DAILY 90 tablet 4  . bacitracin 500 UNIT/GM ointment Apply 1 application topically daily.    Marland Kitchen buPROPion (WELLBUTRIN XL) 300 MG 24 hr tablet Take 1 tablet (300 mg total) by mouth daily. 90 tablet 3  . busPIRone (BUSPAR) 5 MG tablet Take 1 tablet (5 mg total) by mouth 3 (three) times daily. 270 tablet 3  . cefUROXime (CEFTIN) 250 MG tablet     . dexamethasone (DECADRON) 2 MG tablet Take by mouth.    . divalproex (DEPAKOTE) 500 MG DR tablet Take 535m (one tablet) twice daily through 9/24, then decrease to 5076m(one tablet) nightly 9/25-9/28, then stop.    . divalproex (DEPAKOTE) 500 MG DR tablet     . fluconazole (DIFLUCAN) 200 MG tablet Take 1 tablet (200 mg total) by mouth daily. 3 tablet 0  . fluticasone (FLONASE) 50 MCG/ACT nasal spray SHAKE LIQUID AND USE 2 SPRAYS IN EACH NOSTRIL DAILY 16 g 5  . HYDROcodone-homatropine (HYCODAN) 5-1.5 MG/5ML syrup 5 ml 4-6 hours as needed for cough 240 mL 0  . mometasone (NASONEX) 50 MCG/ACT nasal spray Place 2 sprays into the nose daily.    . montelukast (SINGULAIR) 10 MG tablet Take 10 mg by mouth at bedtime.    . Marland Kitchenmeprazole (PRILOSEC) 20 MG capsule Take 1 capsule (20 mg total) by mouth daily. 90 capsule 3  . ondansetron (ZOFRAN-ODT) 8 MG disintegrating tablet Take 1 tablet (8 mg total) by mouth every 8 (eight) hours as needed for nausea or vomiting. 30 tablet 1  . polyethylene glycol (MIRALAX / GLYCOLAX) packet Take 17 g by mouth daily as needed.    . potassium chloride SA (K-DUR,KLOR-CON) 20 MEQ tablet Take 1 tablet (20 mEq total) by mouth daily. 7 tablet 0  . risperiDONE (RISPERDAL) 0.5 MG tablet Take  0.5 mg by mouth at bedtime.     . sertraline (ZOLOFT) 25 MG tablet Take 25 mg by mouth daily.     . solifenacin (VESICARE) 10 MG tablet Take 10 mg by mouth daily.     . Marland Kitchenulfamethoxazole-trimethoprim (BACTRIM DS,SEPTRA DS) 800-160 MG tablet Take 1 tablet by mouth 3 (three) times a week. 30 tablet 0  . temozolomide (TEMODAR) 100 MG capsule Take 300 mg a day for 5 days every 28 days.  May take on an empty stomach or at bedtime to decrease nausea & vomiting. 15 capsule 0  .  traZODone (DESYREL) 50 MG tablet Take 50-100 mg by mouth at bedtime.    Marland Kitchen zolpidem (AMBIEN) 5 MG tablet TAKE 1 TABLET BY MOUTH EVERY NIGHT AT BEDTIME AS NEEDED FOR INSOMNIA 30 tablet 5  . ciprofloxacin (CIPRO) 500 MG tablet Take 1 tablet (500 mg total) by mouth 2 (two) times daily. 14 tablet 0   No current facility-administered medications for this visit.     Review of Systems:  GENERAL:  Feels "good".  No fevers or sweats. Weight down 2 pounds since last month.  PERFORMANCE STATUS (ECOG):  3. HEENT:  Bilateral vision loss.  No sore throat, mouth sores or tenderness. Lungs: No shortness of breath or cough.  No hemoptysis. Cardiac:  No chest pain, palpitations, orthopnea, or PND. GI:  Eating "here and there".  Constipation.  No nausea, vomiting, diarrhea, melena or hematochezia.  Pain pills cause emesis. GU:  No urgency, frequency, dysuria, or hematuria. Musculoskeletal:  No back pain.  No joint pain.  No muscle tenderness. Extremities:  Legs remain weak.  No pain or swelling. Skin:  No decubitus.  Intertriginous rash beneath breasts. No other rashes or skin changes. Neuro:  Left homonymous hemianopsia. Decline in vision.  Unsteady on feet.  No headache or focal numbness. Endocrine:  No diabetes, thyroid issues, hot flashes or night sweats. Psych:  Anxiety attacks (confused at times).  Emotional. Pain:  No focal pain. Review of systems:  All other systems reviewed and found to be negative.  Physical Exam: Blood pressure  130/90, pulse 90, temperature 99.1 F (37.3 C), temperature source Tympanic, weight 166 lb (75.3 kg). GENERAL: Chronically fatigued appearing woman sitting comfortably in a wheelchair in the exam room in no acute distress.  She is examined in the chair. MENTAL STATUS:  Alert and oriented to person and place.  States that it is December 1995. HEAD:  Shoulder length light brown hair.  Cushingoid features. Normocephalic, atraumatic, and symmetric.  EYES:  Sits with eyes open (improved).  Glasses.  Blue eyes.  Pupils equal round and reactive to light and accomodation.  No conjunctivitis or scleral icterus. ENT:  No oral lesions.  No thrush.  Mucous membranes moist.  RESPIRATORY:  Clear to auscultation without rales, wheezes or rhonchi. CARDIOVASCULAR:  Regular rate and rhythm without murmur, rub or gallop. ABDOMEN:  Soft, non-tender, with active bowel sounds, and no hepatosplenomegaly.  No masses. SKIN:  Intertriginous rash beneath breasts; skin erythematous. No other rashes or ulcers.  EXTREMITIES: No edema, no skin discoloration or tenderness.  No palpable cords. LYMPH NODES: No palpable cervical, supraclavicular, axillary or inguinal adenopathy  NEUROLOGICAL: Remembers 3 of 3 words immediately, but 0 of 3 words in 5 minutes (even with prompting).  Does not know Software engineer.  4 quarters in a dollar and 25 nickels in a quarter.  Unable to count backwards from 100.  Face symmetric.  Sensation intact.  Upper extremity strength symmetric.  Unable to consistently perform the finger-nose-finger test. Left leg stronger than right leg.  Bilateral patellar reflexes 1+.  No clonus or Babinski. PSYCH:  Engaged.  Talkative.   Appointment on 11/19/2016  Component Date Value Ref Range Status  . Sodium 11/19/2016 133* 135 - 145 mmol/L Final  . Potassium 11/19/2016 4.2  3.5 - 5.1 mmol/L Final  . Chloride 11/19/2016 96* 101 - 111 mmol/L Final  . CO2 11/19/2016 27  22 - 32 mmol/L Final  . Glucose, Bld 11/19/2016  132* 65 - 99 mg/dL Final  . BUN 11/19/2016 10  6 -  20 mg/dL Final  . Creatinine, Ser 11/19/2016 0.95  0.44 - 1.00 mg/dL Final  . Calcium 11/19/2016 9.3  8.9 - 10.3 mg/dL Final  . Total Protein 11/19/2016 6.3* 6.5 - 8.1 g/dL Final  . Albumin 11/19/2016 3.7  3.5 - 5.0 g/dL Final  . AST 11/19/2016 21  15 - 41 U/L Final  . ALT 11/19/2016 40  14 - 54 U/L Final  . Alkaline Phosphatase 11/19/2016 68  38 - 126 U/L Final  . Total Bilirubin 11/19/2016 2.3* 0.3 - 1.2 mg/dL Final  . GFR calc non Af Amer 11/19/2016 >60  >60 mL/min Final  . GFR calc Af Amer 11/19/2016 >60  >60 mL/min Final   Comment: (NOTE) The eGFR has been calculated using the CKD EPI equation. This calculation has not been validated in all clinical situations. eGFR's persistently <60 mL/min signify possible Chronic Kidney Disease.   . Anion gap 11/19/2016 10  5 - 15 Final  . WBC 11/19/2016 5.4  3.6 - 11.0 K/uL Final  . RBC 11/19/2016 4.11  3.80 - 5.20 MIL/uL Final  . Hemoglobin 11/19/2016 14.0  12.0 - 16.0 g/dL Final  . HCT 11/19/2016 39.5  35.0 - 47.0 % Final  . MCV 11/19/2016 96.2  80.0 - 100.0 fL Final  . MCH 11/19/2016 34.0  26.0 - 34.0 pg Final  . MCHC 11/19/2016 35.3  32.0 - 36.0 g/dL Final  . RDW 11/19/2016 15.1* 11.5 - 14.5 % Final  . Platelets 11/19/2016 120* 150 - 440 K/uL Final  . Neutrophils Relative % 11/19/2016 87  % Final  . Neutro Abs 11/19/2016 4.7  1.4 - 6.5 K/uL Final  . Lymphocytes Relative 11/19/2016 5  % Final  . Lymphs Abs 11/19/2016 0.3* 1.0 - 3.6 K/uL Final  . Monocytes Relative 11/19/2016 7  % Final  . Monocytes Absolute 11/19/2016 0.4  0.2 - 0.9 K/uL Final  . Eosinophils Relative 11/19/2016 1  % Final  . Eosinophils Absolute 11/19/2016 0.0  0 - 0.7 K/uL Final  . Basophils Relative 11/19/2016 0  % Final  . Basophils Absolute 11/19/2016 0.0  0 - 0.1 K/uL Final    Assessment:  NEEKA URISTA is a 61 y.o. 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    Head MRI at [facility] on 07/01/2016 revealed a 5.7 x 4.5 cm heterogeneously enhancing T2 hyperintense lesion in the right is posterior medial temporal lobe with extension into the adjacent right parietal and occipital lobes.  There was a separate enhancing component along the ependymal surface at the body of the right lateral ventricle extending into the centrum semiovale. There was marked mass effect on the atrium and occipital horn of the right lateral ventricle with slight increase in dilation of the temporal horn of the right lateral ventricle, consistent with at least partial entrapment. There was stable dilation of the left lateral ventricle.  There was no hemorrhage or large acute cortical infarction.   Head MRI at [facility] on 07/02/2016 revealed substantial resection of the mass in the right cerebral hemisphere with residual disease disease. There was T2/FLAIR signal hyperintensity along the margins of the resection cavity. There was focal masslike T2/FLAIR signal hyperintensity and enhancement in the right frontal periventricular white matter, and within the splenium of the corpus callosum compatible with residual disease. There was enhancement along the margins of the resection cavity, which appeared nodular at the posterolateral aspect.  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  She received cranial radiation from 08/06/2016 - 09/25/2016.  She received daily concurrent Temodar (08/10/2016 - 09/25/2016).  She was on Bactrim prophylaxis.  She is on Decadron 2 mg a day.  Head MRI at [facility] on 10/08/2016 revealed significant interval progression of disease including increased nodular enhancing components along the resection cavity with extension past midline. There was focal rim-enhancing lesion (1.6 cm compared to 1 cm) along the right frontal periventricular region concerning for additional distant residual disease. Findings were felt consistent with recent radiation therapy versus true  disease progression.  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  She is s/p 2 cycles of Avastin (10/30/2016 - 11/13/2016).  She began cycle #1 Temodar on 11/13/2016.  She was admitted to [facility] from 10/15/2016 - 10/23/2016 secondary to increased confusion and agitation with elements of psychosis.  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  Symptomatically, she requires assistance with her ADLs. She has auditory and visual hallucinations at home. She has been "really emotional". Exam reveals Cushingoid features, with a marked decrease in her overall ability to see on her left side. Patient has an intertriginous rash beneath her breasts. She has persistent lymphocytopenia (ALC 300) while on Temodar.  WBC is 5400 with an [place] of 4700. Hemoglobin is 14.0, hematocrit 39.5, and platelets 120,000.  Sodium slightly low at 133. Total bilirubin is 2.3 (direct bili 0.4) today.   Plan: 1.  Labs today:  CBC with diff, UA, urine culture, direct bili 2.  Discuss cycle #1 Temodar.  She is currently day 7 of cycle #1 today.  Plan to continue Temodar 332m a day x 5 days every 28 days x 2 cycles with reimaging after 2 cycles.  3.  Continue Septra DS 1 tablet every Monday, Wednesday, Friday for PCP prophylaxis.  Continue until resolution of lymphopenia.  4.  Resend Rx for:  ciprofloxacin 5069mBID x 7 days 5.  Discuss rash beneath breasts. Patient encouraged to dry areas thoroughly after bathing. Will send in a Rx for Nystatin powder four times a day x 5 days, then PRN 6.  Discuss advanced directives. Husband notes that the previously reported packet is "still laying on the table". He notes that they have not discussed advanced care directives as of yet. Patient and husband encouraged to complete packet to define her care wishes.  7.  Continue calcium 1200 mg daily. 8.  Discuss  continued steroid taper.  Dose will be decreased in half every week as tolerated.  Instructed to decrease Decadron to 82m57m day on Monday 11/23/2016. 9.  RTC on 11/27/2016 for MD assessment, labs (CBC with diff, CMP, urine protein, direct bili), and cycle #3 Avastin.   BryHonor LohP  11/19/2016, 4:24 PM   I saw and evaluated the patient, participating in the key portions of the service and reviewing pertinent diagnostic studies and records.  I reviewed the nurse practitioner's note and agree with the findings and the plan.  The assessment and plan were discussed with the patient.  Multiple questions were asked by the patient and answered.   MelNolon StallsD 11/19/2016,4:24 PM

## 2016-11-19 ENCOUNTER — Encounter: Payer: Self-pay | Admitting: Hematology and Oncology

## 2016-11-19 ENCOUNTER — Inpatient Hospital Stay (HOSPITAL_BASED_OUTPATIENT_CLINIC_OR_DEPARTMENT_OTHER): Payer: BLUE CROSS/BLUE SHIELD | Admitting: Hematology and Oncology

## 2016-11-19 ENCOUNTER — Inpatient Hospital Stay: Payer: BLUE CROSS/BLUE SHIELD

## 2016-11-19 VITALS — BP 130/90 | HR 90 | Temp 99.1°F | Wt 166.0 lb

## 2016-11-19 DIAGNOSIS — J45909 Unspecified asthma, uncomplicated: Secondary | ICD-10-CM

## 2016-11-19 DIAGNOSIS — R451 Restlessness and agitation: Secondary | ICD-10-CM

## 2016-11-19 DIAGNOSIS — K5900 Constipation, unspecified: Secondary | ICD-10-CM | POA: Diagnosis not present

## 2016-11-19 DIAGNOSIS — R440 Auditory hallucinations: Secondary | ICD-10-CM

## 2016-11-19 DIAGNOSIS — E7800 Pure hypercholesterolemia, unspecified: Secondary | ICD-10-CM

## 2016-11-19 DIAGNOSIS — R21 Rash and other nonspecific skin eruption: Secondary | ICD-10-CM

## 2016-11-19 DIAGNOSIS — Z923 Personal history of irradiation: Secondary | ICD-10-CM

## 2016-11-19 DIAGNOSIS — N3000 Acute cystitis without hematuria: Secondary | ICD-10-CM

## 2016-11-19 DIAGNOSIS — F410 Panic disorder [episodic paroxysmal anxiety] without agoraphobia: Secondary | ICD-10-CM

## 2016-11-19 DIAGNOSIS — D72810 Lymphocytopenia: Secondary | ICD-10-CM

## 2016-11-19 DIAGNOSIS — G910 Communicating hydrocephalus: Secondary | ICD-10-CM

## 2016-11-19 DIAGNOSIS — F418 Other specified anxiety disorders: Secondary | ICD-10-CM

## 2016-11-19 DIAGNOSIS — R634 Abnormal weight loss: Secondary | ICD-10-CM | POA: Diagnosis not present

## 2016-11-19 DIAGNOSIS — R82998 Other abnormal findings in urine: Secondary | ICD-10-CM | POA: Diagnosis not present

## 2016-11-19 DIAGNOSIS — R410 Disorientation, unspecified: Secondary | ICD-10-CM | POA: Diagnosis not present

## 2016-11-19 DIAGNOSIS — Z880 Allergy status to penicillin: Secondary | ICD-10-CM

## 2016-11-19 DIAGNOSIS — R441 Visual hallucinations: Secondary | ICD-10-CM

## 2016-11-19 DIAGNOSIS — R112 Nausea with vomiting, unspecified: Secondary | ICD-10-CM

## 2016-11-19 DIAGNOSIS — C719 Malignant neoplasm of brain, unspecified: Secondary | ICD-10-CM | POA: Diagnosis not present

## 2016-11-19 DIAGNOSIS — Z7189 Other specified counseling: Secondary | ICD-10-CM

## 2016-11-19 DIAGNOSIS — N390 Urinary tract infection, site not specified: Secondary | ICD-10-CM

## 2016-11-19 DIAGNOSIS — Z803 Family history of malignant neoplasm of breast: Secondary | ICD-10-CM

## 2016-11-19 DIAGNOSIS — Z79899 Other long term (current) drug therapy: Secondary | ICD-10-CM

## 2016-11-19 DIAGNOSIS — H547 Unspecified visual loss: Secondary | ICD-10-CM

## 2016-11-19 DIAGNOSIS — Z87440 Personal history of urinary (tract) infections: Secondary | ICD-10-CM

## 2016-11-19 DIAGNOSIS — C718 Malignant neoplasm of overlapping sites of brain: Secondary | ICD-10-CM | POA: Diagnosis not present

## 2016-11-19 LAB — COMPREHENSIVE METABOLIC PANEL
ALT: 40 U/L (ref 14–54)
AST: 21 U/L (ref 15–41)
Albumin: 3.7 g/dL (ref 3.5–5.0)
Alkaline Phosphatase: 68 U/L (ref 38–126)
Anion gap: 10 (ref 5–15)
BUN: 10 mg/dL (ref 6–20)
CO2: 27 mmol/L (ref 22–32)
Calcium: 9.3 mg/dL (ref 8.9–10.3)
Chloride: 96 mmol/L — ABNORMAL LOW (ref 101–111)
Creatinine, Ser: 0.95 mg/dL (ref 0.44–1.00)
GFR calc Af Amer: >60 mL/min (ref >60)
GFR calc non Af Amer: >60 mL/min (ref >60)
Glucose, Bld: 132 mg/dL — ABNORMAL HIGH (ref 65–99)
Potassium: 4.2 mmol/L (ref 3.5–5.1)
Sodium: 133 mmol/L — ABNORMAL LOW (ref 135–145)
Total Bilirubin: 2.3 mg/dL — ABNORMAL HIGH (ref 0.3–1.2)
Total Protein: 6.3 g/dL — ABNORMAL LOW (ref 6.5–8.1)

## 2016-11-19 LAB — CBC WITH DIFFERENTIAL/PLATELET
Basophils Absolute: 0 10*3/uL (ref 0–0.1)
Basophils Relative: 0 %
Eosinophils Absolute: 0 10*3/uL (ref 0–0.7)
Eosinophils Relative: 1 %
HCT: 39.5 % (ref 35.0–47.0)
Hemoglobin: 14 g/dL (ref 12.0–16.0)
Lymphocytes Relative: 5 %
Lymphs Abs: 0.3 10*3/uL — ABNORMAL LOW (ref 1.0–3.6)
MCH: 34 pg (ref 26.0–34.0)
MCHC: 35.3 g/dL (ref 32.0–36.0)
MCV: 96.2 fL (ref 80.0–100.0)
Monocytes Absolute: 0.4 10*3/uL (ref 0.2–0.9)
Monocytes Relative: 7 %
Neutro Abs: 4.7 10*3/uL (ref 1.4–6.5)
Neutrophils Relative %: 87 %
Platelets: 120 10*3/uL — ABNORMAL LOW (ref 150–440)
RBC: 4.11 MIL/uL (ref 3.80–5.20)
RDW: 15.1 % — ABNORMAL HIGH (ref 11.5–14.5)
WBC: 5.4 10*3/uL (ref 3.6–11.0)

## 2016-11-19 LAB — BILIRUBIN, DIRECT: Bilirubin, Direct: 0.4 mg/dL (ref 0.1–0.5)

## 2016-11-19 MED ORDER — NYSTATIN 100000 UNIT/GM EX POWD: Freq: Four times a day (QID) | CUTANEOUS | 1 refills | Status: AC

## 2016-11-19 MED ORDER — CIPROFLOXACIN HCL 500 MG PO TABS: 500.0000 mg | ORAL_TABLET | Freq: Two times a day (BID) | ORAL | 0 refills | Status: DC

## 2016-11-19 NOTE — Progress Notes (Signed)
Patient is here for follow up with labs. She is unable to stand up to weigh, even with the assistance of two nurses. She was weighed today on the wheelchair scale.  She denies having any pain. She has difficulty communicating and appears to be obtunded.

## 2016-11-20 ENCOUNTER — Other Ambulatory Visit: Payer: Self-pay | Admitting: *Deleted

## 2016-11-20 ENCOUNTER — Other Ambulatory Visit: Payer: Self-pay | Admitting: Urgent Care

## 2016-11-20 ENCOUNTER — Telehealth: Payer: Self-pay | Admitting: *Deleted

## 2016-11-20 DIAGNOSIS — N390 Urinary tract infection, site not specified: Secondary | ICD-10-CM

## 2016-11-20 LAB — URINALYSIS, COMPLETE (UACMP) WITH MICROSCOPIC
GLUCOSE, UA: NEGATIVE mg/dL
KETONES UR: NEGATIVE mg/dL
Nitrite: POSITIVE — AB
PROTEIN: 30 mg/dL — AB
Specific Gravity, Urine: 1.025 (ref 1.005–1.030)
pH: 6 (ref 5.0–8.0)

## 2016-11-20 NOTE — Telephone Encounter (Signed)
I called verbal order to [facility] and let her know we sent prescription yesterday. I had to leave message on voice mail

## 2016-11-20 NOTE — Telephone Encounter (Signed)
A orders for Skilled nursing for patient. States she has bad skin breakdown under her breasts. Please advise, or call her with orders

## 2016-11-20 NOTE — Telephone Encounter (Signed)
  OK for skilled nursing.  M

## 2016-11-20 NOTE — Telephone Encounter (Signed)
I saw the intertriginous rash beneath the breasts yesterday. I sent in Rx for Nystatin powder QID x 5 days, then PRN.

## 2016-11-21 LAB — URINE CULTURE: Culture: 60000 — AB

## 2016-11-23 ENCOUNTER — Telehealth: Payer: Self-pay | Admitting: *Deleted

## 2016-11-23 ENCOUNTER — Ambulatory Visit: Payer: BLUE CROSS/BLUE SHIELD | Admitting: Physical Therapy

## 2016-11-23 NOTE — Telephone Encounter (Signed)
  Concern that symptoms aren't secondary to constipation.  No change in my recommendations.  M

## 2016-11-23 NOTE — Telephone Encounter (Signed)
Jessica [name] has accepted an appointment for tomorrow ar 130 PM

## 2016-11-23 NOTE — Telephone Encounter (Signed)
Advised husband of Dr Drenda Freeze response and offer ed an appointment with NP, but he states he wants to wait because he realized that it has been at least a week since she has had a bowel movement and he had care takers to give her medicine for that and he wants to seeif it helps first. He will either call back for an appointment or take her to the ER if it doesn't

## 2016-11-23 NOTE — Telephone Encounter (Signed)
Jessica [name] called reporting that patient is not eating drinking or getting out of bed, asking what he needs to do. Please advise

## 2016-11-23 NOTE — Telephone Encounter (Signed)
  Patient needs to be seen today.  Symptom management clinic or ER.  M

## 2016-11-24 ENCOUNTER — Observation Stay
Admission: AD | Admit: 2016-11-24 | Discharge: 2016-11-28 | Disposition: A | Discharge status: Home / Self Care | Payer: BLUE CROSS/BLUE SHIELD | Source: Ambulatory Visit | Attending: Internal Medicine | Admitting: Internal Medicine

## 2016-11-24 ENCOUNTER — Inpatient Hospital Stay (HOSPITAL_BASED_OUTPATIENT_CLINIC_OR_DEPARTMENT_OTHER): Payer: BLUE CROSS/BLUE SHIELD | Admitting: Oncology

## 2016-11-24 ENCOUNTER — Observation Stay: Payer: BLUE CROSS/BLUE SHIELD

## 2016-11-24 VITALS — BP 129/92 | HR 121 | Temp 97.1°F | Resp 18 | Wt 163.0 lb

## 2016-11-24 DIAGNOSIS — R251 Tremor, unspecified: Secondary | ICD-10-CM | POA: Diagnosis not present

## 2016-11-24 DIAGNOSIS — Z888 Allergy status to other drugs, medicaments and biological substances status: Secondary | ICD-10-CM | POA: Insufficient documentation

## 2016-11-24 DIAGNOSIS — R278 Other lack of coordination: Secondary | ICD-10-CM | POA: Diagnosis not present

## 2016-11-24 DIAGNOSIS — Z79891 Long term (current) use of opiate analgesic: Secondary | ICD-10-CM | POA: Diagnosis not present

## 2016-11-24 DIAGNOSIS — G9340 Encephalopathy, unspecified: Secondary | ICD-10-CM | POA: Diagnosis not present

## 2016-11-24 DIAGNOSIS — F418 Other specified anxiety disorders: Secondary | ICD-10-CM | POA: Insufficient documentation

## 2016-11-24 DIAGNOSIS — C719 Malignant neoplasm of brain, unspecified: Secondary | ICD-10-CM

## 2016-11-24 DIAGNOSIS — C718 Malignant neoplasm of overlapping sites of brain: Secondary | ICD-10-CM

## 2016-11-24 DIAGNOSIS — D696 Thrombocytopenia, unspecified: Secondary | ICD-10-CM | POA: Diagnosis not present

## 2016-11-24 DIAGNOSIS — R4182 Altered mental status, unspecified: Secondary | ICD-10-CM | POA: Diagnosis present

## 2016-11-24 DIAGNOSIS — Z881 Allergy status to other antibiotic agents status: Secondary | ICD-10-CM | POA: Insufficient documentation

## 2016-11-24 DIAGNOSIS — Z79899 Other long term (current) drug therapy: Secondary | ICD-10-CM | POA: Diagnosis not present

## 2016-11-24 DIAGNOSIS — R634 Abnormal weight loss: Secondary | ICD-10-CM

## 2016-11-24 DIAGNOSIS — E7800 Pure hypercholesterolemia, unspecified: Secondary | ICD-10-CM | POA: Insufficient documentation

## 2016-11-24 DIAGNOSIS — R531 Weakness: Secondary | ICD-10-CM

## 2016-11-24 DIAGNOSIS — R630 Anorexia: Secondary | ICD-10-CM

## 2016-11-24 DIAGNOSIS — K219 Gastro-esophageal reflux disease without esophagitis: Secondary | ICD-10-CM | POA: Insufficient documentation

## 2016-11-24 DIAGNOSIS — R21 Rash and other nonspecific skin eruption: Secondary | ICD-10-CM

## 2016-11-24 DIAGNOSIS — Z803 Family history of malignant neoplasm of breast: Secondary | ICD-10-CM

## 2016-11-24 DIAGNOSIS — Z923 Personal history of irradiation: Secondary | ICD-10-CM

## 2016-11-24 DIAGNOSIS — Z791 Long term (current) use of non-steroidal anti-inflammatories (NSAID): Secondary | ICD-10-CM | POA: Insufficient documentation

## 2016-11-24 DIAGNOSIS — E538 Deficiency of other specified B group vitamins: Secondary | ICD-10-CM | POA: Diagnosis not present

## 2016-11-24 DIAGNOSIS — R410 Disorientation, unspecified: Secondary | ICD-10-CM

## 2016-11-24 DIAGNOSIS — Z792 Long term (current) use of antibiotics: Secondary | ICD-10-CM | POA: Diagnosis not present

## 2016-11-24 DIAGNOSIS — K5900 Constipation, unspecified: Secondary | ICD-10-CM

## 2016-11-24 DIAGNOSIS — R82998 Other abnormal findings in urine: Secondary | ICD-10-CM

## 2016-11-24 DIAGNOSIS — J45909 Unspecified asthma, uncomplicated: Secondary | ICD-10-CM

## 2016-11-24 DIAGNOSIS — Z23 Encounter for immunization: Secondary | ICD-10-CM | POA: Insufficient documentation

## 2016-11-24 DIAGNOSIS — Z880 Allergy status to penicillin: Secondary | ICD-10-CM

## 2016-11-24 DIAGNOSIS — R32 Unspecified urinary incontinence: Secondary | ICD-10-CM | POA: Diagnosis not present

## 2016-11-24 DIAGNOSIS — D72810 Lymphocytopenia: Secondary | ICD-10-CM | POA: Diagnosis not present

## 2016-11-24 DIAGNOSIS — R1310 Dysphagia, unspecified: Secondary | ICD-10-CM | POA: Insufficient documentation

## 2016-11-24 DIAGNOSIS — E785 Hyperlipidemia, unspecified: Secondary | ICD-10-CM | POA: Diagnosis not present

## 2016-11-24 DIAGNOSIS — Z7401 Bed confinement status: Secondary | ICD-10-CM

## 2016-11-24 DIAGNOSIS — F419 Anxiety disorder, unspecified: Secondary | ICD-10-CM | POA: Insufficient documentation

## 2016-11-24 DIAGNOSIS — N390 Urinary tract infection, site not specified: Secondary | ICD-10-CM | POA: Diagnosis not present

## 2016-11-24 DIAGNOSIS — E876 Hypokalemia: Secondary | ICD-10-CM | POA: Insufficient documentation

## 2016-11-24 DIAGNOSIS — F410 Panic disorder [episodic paroxysmal anxiety] without agoraphobia: Secondary | ICD-10-CM

## 2016-11-24 DIAGNOSIS — R440 Auditory hallucinations: Secondary | ICD-10-CM

## 2016-11-24 DIAGNOSIS — R441 Visual hallucinations: Secondary | ICD-10-CM

## 2016-11-24 DIAGNOSIS — R000 Tachycardia, unspecified: Secondary | ICD-10-CM

## 2016-11-24 DIAGNOSIS — G910 Communicating hydrocephalus: Secondary | ICD-10-CM

## 2016-11-24 DIAGNOSIS — R17 Unspecified jaundice: Secondary | ICD-10-CM

## 2016-11-24 LAB — COMPREHENSIVE METABOLIC PANEL
ALBUMIN: 3.8 g/dL (ref 3.5–5.0)
ALT: 40 U/L (ref 14–54)
AST: 21 U/L (ref 15–41)
Alkaline Phosphatase: 62 U/L (ref 38–126)
Anion gap: 12 (ref 5–15)
BUN: 11 mg/dL (ref 6–20)
CHLORIDE: 100 mmol/L — AB (ref 101–111)
CO2: 25 mmol/L (ref 22–32)
Calcium: 9.1 mg/dL (ref 8.9–10.3)
Creatinine, Ser: 1 mg/dL (ref 0.44–1.00)
GFR calc Af Amer: >60 mL/min (ref >60)
GFR calc non Af Amer: 59 mL/min — ABNORMAL LOW (ref >60)
GLUCOSE: 145 mg/dL — AB (ref 65–99)
POTASSIUM: 4.1 mmol/L (ref 3.5–5.1)
SODIUM: 137 mmol/L (ref 135–145)
Total Bilirubin: 2 mg/dL — ABNORMAL HIGH (ref 0.3–1.2)
Total Protein: 6.2 g/dL — ABNORMAL LOW (ref 6.5–8.1)

## 2016-11-24 LAB — CBC
HCT: 43.6 % (ref 35.0–47.0)
Hemoglobin: 15 g/dL (ref 12.0–16.0)
MCH: 33.6 pg (ref 26.0–34.0)
MCHC: 34.4 g/dL (ref 32.0–36.0)
MCV: 97.6 fL (ref 80.0–100.0)
PLATELETS: 148 10*3/uL — AB (ref 150–440)
RBC: 4.47 MIL/uL (ref 3.80–5.20)
RDW: 16.1 % — AB (ref 11.5–14.5)
WBC: 5 10*3/uL (ref 3.6–11.0)

## 2016-11-24 LAB — URINALYSIS, ROUTINE W REFLEX MICROSCOPIC
Bacteria, UA: NONE SEEN
Glucose, UA: NEGATIVE mg/dL
Hgb urine dipstick: NEGATIVE
Ketones, ur: 5 mg/dL — AB
LEUKOCYTES UA: NEGATIVE
Nitrite: NEGATIVE
PH: 5 (ref 5.0–8.0)
Protein, ur: 100 mg/dL — AB
RBC / HPF: NONE SEEN RBC/hpf (ref 0–5)
WBC, UA: NONE SEEN WBC/hpf (ref 0–5)

## 2016-11-24 LAB — VALPROIC ACID LEVEL: Valproic Acid Lvl: <10 ug/mL — ABNORMAL LOW (ref 50.0–100.0)

## 2016-11-24 LAB — AMMONIA: AMMONIA: 11 umol/L (ref 9–35)

## 2016-11-24 MED ORDER — ENOXAPARIN SODIUM 40 MG/0.4ML ~~LOC~~ SOLN
40.0000 mg | SUBCUTANEOUS | Status: DC
  Administered 2016-11-24 – 2016-11-27 (×4): 40 mg via SUBCUTANEOUS
  Filled 2016-11-24 (×4): qty 0.4

## 2016-11-24 MED ORDER — BUPROPION HCL ER (XL) 300 MG PO TB24
300.0000 mg | ORAL_TABLET | Freq: Every day | ORAL | Status: DC
  Administered 2016-11-24 – 2016-11-28 (×5): 300 mg via ORAL
  Filled 2016-11-24 (×5): qty 1

## 2016-11-24 MED ORDER — DEXTROSE 5 % IV SOLN
1.0000 g | INTRAVENOUS | Status: AC
  Administered 2016-11-24 – 2016-11-27 (×4): 1 g via INTRAVENOUS
  Filled 2016-11-24 (×4): qty 10

## 2016-11-24 MED ORDER — FLUTICASONE PROPIONATE 50 MCG/ACT NA SUSP
2.0000 | Freq: Every day | NASAL | Status: DC
  Administered 2016-11-25 – 2016-11-28 (×4): 2 via NASAL
  Filled 2016-11-24: qty 16

## 2016-11-24 MED ORDER — SODIUM CHLORIDE 0.9 % IV SOLN
INTRAVENOUS | Status: DC
  Administered 2016-11-24 – 2016-11-28 (×5): via INTRAVENOUS

## 2016-11-24 MED ORDER — BUSPIRONE HCL 5 MG PO TABS
5.0000 mg | ORAL_TABLET | Freq: Three times a day (TID) | ORAL | Status: DC
  Administered 2016-11-24 – 2016-11-28 (×12): 5 mg via ORAL
  Filled 2016-11-24 (×15): qty 1

## 2016-11-24 MED ORDER — DARIFENACIN HYDROBROMIDE ER 7.5 MG PO TB24
7.5000 mg | ORAL_TABLET | Freq: Every day | ORAL | Status: DC
  Administered 2016-11-25 – 2016-11-28 (×4): 7.5 mg via ORAL
  Filled 2016-11-24 (×4): qty 1

## 2016-11-24 MED ORDER — POTASSIUM CHLORIDE CRYS ER 20 MEQ PO TBCR
20.0000 meq | EXTENDED_RELEASE_TABLET | Freq: Every day | ORAL | Status: DC
  Administered 2016-11-25 – 2016-11-28 (×4): 20 meq via ORAL
  Filled 2016-11-24 (×5): qty 1

## 2016-11-24 MED ORDER — DEXTROSE 5 % IV SOLN: 1.0000 g | INTRAVENOUS | Status: DC

## 2016-11-24 MED ORDER — RISPERIDONE 0.5 MG PO TABS
0.5000 mg | ORAL_TABLET | Freq: Every day | ORAL | Status: DC
  Administered 2016-11-24 – 2016-11-27 (×4): 0.5 mg via ORAL
  Filled 2016-11-24 (×5): qty 1

## 2016-11-24 MED ORDER — PANTOPRAZOLE SODIUM 40 MG PO TBEC
40.0000 mg | DELAYED_RELEASE_TABLET | Freq: Every day | ORAL | Status: DC
  Administered 2016-11-25 – 2016-11-28 (×4): 40 mg via ORAL
  Filled 2016-11-24 (×4): qty 1

## 2016-11-24 MED ORDER — ACETAMINOPHEN 650 MG RE SUPP: 650.0000 mg | Freq: Four times a day (QID) | RECTAL | Status: DC | PRN

## 2016-11-24 MED ORDER — INFLUENZA VAC SPLIT QUAD 0.5 ML IM SUSY
0.5000 mL | PREFILLED_SYRINGE | INTRAMUSCULAR | Status: AC
Start: 2016-11-25 — End: 2016-11-25
  Administered 2016-11-25: 12:00:00 0.5 mL via INTRAMUSCULAR
  Filled 2016-11-24 (×2): qty 0.5

## 2016-11-24 MED ORDER — ACETAMINOPHEN 325 MG PO TABS: 650.0000 mg | ORAL_TABLET | Freq: Four times a day (QID) | ORAL | Status: DC | PRN

## 2016-11-24 MED ORDER — TRAZODONE HCL 50 MG PO TABS
50.0000 mg | ORAL_TABLET | Freq: Every day | ORAL | Status: DC
  Administered 2016-11-24 – 2016-11-25 (×2): 50 mg via ORAL
  Filled 2016-11-24 (×2): qty 1

## 2016-11-24 MED ORDER — NYSTATIN 100000 UNIT/GM EX POWD
Freq: Four times a day (QID) | CUTANEOUS | Status: DC
  Administered 2016-11-24 – 2016-11-27 (×13): via TOPICAL
  Administered 2016-11-28: 1 via TOPICAL
  Filled 2016-11-24: qty 15

## 2016-11-24 MED ORDER — ZOLPIDEM TARTRATE 5 MG PO TABS: 5.0000 mg | ORAL_TABLET | Freq: Every evening | ORAL | Status: DC | PRN

## 2016-11-24 MED ORDER — SERTRALINE HCL 50 MG PO TABS
25.0000 mg | ORAL_TABLET | Freq: Every day | ORAL | Status: DC
  Administered 2016-11-24 – 2016-11-28 (×5): 25 mg via ORAL
  Filled 2016-11-24 (×5): qty 1

## 2016-11-24 MED ORDER — MONTELUKAST SODIUM 10 MG PO TABS
10.0000 mg | ORAL_TABLET | Freq: Every day | ORAL | Status: DC
  Administered 2016-11-24 – 2016-11-27 (×4): 10 mg via ORAL
  Filled 2016-11-24 (×4): qty 1

## 2016-11-24 MED ORDER — ONDANSETRON HCL 4 MG/2ML IJ SOLN: 4.0000 mg | Freq: Four times a day (QID) | INTRAMUSCULAR | Status: DC | PRN

## 2016-11-24 MED ORDER — ONDANSETRON HCL 4 MG PO TABS: 4.0000 mg | ORAL_TABLET | Freq: Four times a day (QID) | ORAL | Status: DC | PRN

## 2016-11-24 NOTE — Progress Notes (Signed)
Here for add on appt. Per husband and pt mother. There has been a change in behavior and mental status x 4 days. Pt mute--not following direction from anyone to open eyes. Finally stated she was " in [place]"  Per husband she has been taking only minimal sips of water-maybe 4 oz of fluid since 9am this am. No solids today or last eve. Last ate 2 fries and 1 chicken piece yesterday at  3 pm with coaxing. . Per family has been mute but 4 days ago was interacting to their questions.  No BM per husband x 7-9 days . Pt sitting w eyes closed, restless and changing positions occasionally otherwise does not appear alert to surroundings. Lennon Alstrom ARNP informed and will see pt

## 2016-11-24 NOTE — Progress Notes (Signed)
Symptom Management Consult note [facility]  [phone] [phone]  Patient Care Team: Kirk Ruths, MD as PCP - General (Internal Medicine) [provider] Barrie Dunker, MD (Dermatology) Noreene Filbert, MD as Referring Physician (Radiation Oncology)   Name of the patient: Jessica [name]  [id number]  23-Oct-1955   Date of visit: 11/24/16  Diagnosis- Glioblastoma   Chief complaint/ Reason for visit- Altered mental status  Heme/Onc history: Jessica [name] is a 61 y.o. 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    Head MRI at [facility] on 07/01/2016 revealed a 5.7 x 4.5 cm heterogeneously enhancing T2 hyperintense lesion in the right is posterior medial temporal lobe with extension into the adjacent right parietal and occipital lobes.  There was a separate enhancing component along the ependymal surface at the body of the right lateral ventricle extending into the centrum semiovale. There was marked mass effect on the atrium and occipital horn of the right lateral ventricle with slight increase in dilation of the temporal horn of the right lateral ventricle, consistent with at least partial entrapment. There was stable dilation of the left lateral ventricle.  There was no hemorrhage or large acute cortical infarction.   Head MRI at [facility] on 07/02/2016 revealed substantial resection of the mass in the right cerebral hemisphere with residual disease disease. There was T2/FLAIR signal hyperintensity along the margins of the resection cavity. There was focal masslike T2/FLAIR signal hyperintensity and enhancement in the right frontal periventricular white matter, and within the splenium of the corpus callosum compatible with residual disease. There was enhancement along the margins of the resection cavity, which appeared nodular at the posterolateral aspect.  There was acute  infarct in the right occipital lobe.  She received cranial radiation from 08/06/2016 - 09/25/2016.  She received daily concurrent Temodar (08/10/2016 - 09/25/2016).  She was on Bactrim prophylaxis.  She is on Decadron 2 mg a day.  Head MRI at [facility] on 10/08/2016 revealed significant interval progression of disease including increased nodular enhancing components along the resection cavity with extension past midline. There was focal rim-enhancing lesion (1.6 cm compared to 1 cm) along the right frontal periventricular region concerning for additional distant residual disease. Findings were felt consistent with recent radiation therapy versus true disease progression.  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  She is s/p 2 cycles of Avastin (10/30/2016 - 11/13/2016).  She began cycle #1 Temodar on 11/13/2016.  She was admitted to [facility] from 10/15/2016 - 10/23/2016 secondary to increased confusion and agitation with elements of psychosis.  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  Interval history- Patient was last seen by Dr. Mike Gip on 11/19/2016 where she stated she was "feeling good". She had just started her Temodar 300 mg on 11/16/2016 and only complained of mild nausea and vomiting the first night. She had began to eat a little better per her husband but did complain of mild constipation. She continued her Decadron 2 mg daily. Her activity continued to be limited and she continued to spend most of her time in her chair. She required maximum assistance with transfers and ambulation and she continued to find it hard to complete simple tasks. She continued her prophylaxis Septra every Monday, Wednesday and Friday. She was noted to have a urinary tract infection on last visit  and was instructed to pick up antibiotics but unfortunately they did not. They denied  any fevers at home. She was disorientated and delusional when asked simple questions and she was unable to consistently complete the finger-nose-finger test. Her husband states she has auditory and visual hallucinations at home and continues to be very emotional. Her exam revealed cushingoid features. She had a rash beneath both breasts. A new prescription for Cipro 500 mg twice a day 7 days was sent in for her UTI and Nystain powder for her rash. Advanced directives were briefly discussed with the husband but he was not wanting to discuss at this time.   Today she presents with her husband and mother with continued altered mental status. Patient's husband states that beginning approximately 1-2 weeks ago he noticed a change in her mental status where she was responding less (if at all) when asked questions. This has significantly declined over the past several days and now she sometimes does not respond at all. She mostly sits in her chair and stares off into space. They have continued concerns on how to safely transfer her from her bed to her chair and to the bathroom. Her husband states, "I can't lift her anymore. My two sons have to lift her for me". Yesterday she slid from her chair to the floor and her husband was unable to get her off the floor. He had to call one of his sons to move her back to her chair. He states they are having trouble taking care of her at home. She is also not eating or drinking fluids. Her husband states she may have had a half a cup of water yesterday and two french fries. He also can't remember the last time she has a bowel movement. He states he may have been a week aghShe has not been taking all of her medications regularly and the husband is unsure of what she actually swallows. They have found several pills on the ground or on her shirt. He did pick up her prescription for Cipro and gave her "a few days worth". He did not know whether to continue her medications given her  change in mental status. They are uncertain on what to do at this point due to her significant decline recently.   ECOG FS:4 - Bedbound  Review of systems- Review of Systems  Unable to perform ROS: Mental status change    Current treatment- Cycle 1 Temodar on 11/13/16.  Allergies  Allergen Reactions  . Compazine  [Prochlorperazine] Anaphylaxis    swelling, SOB.  . Augmentin [Amoxicillin-Pot Clavulanate] Nausea And Vomiting  . Oxycodone Nausea Only     Past Medical History:  Diagnosis Date  . Asthma    Pt does not use inhaler   . Elevated cholesterol   . Glioblastoma ([place])   . History of chicken pox   . History of measles   . History of mumps      Past Surgical History:  Procedure Laterality Date  . CT Scan of head  2009   negative  . FOOT SURGERY    . LAPAROSCOPY     abdominal endometriosis  . TUBAL LIGATION  1984    Social History   Social History  . Marital status: Married    Spouse name: N/A  . Number of children: 2  . Years of education: N/A   Occupational History  . Manager     [employer]   Social History Main Topics  . Smoking  status: Never Smoker  . Smokeless tobacco: Never Used  . Alcohol use 0.0 oz/week     Comment: occasional use  . Drug use: No  . Sexual activity: Not on file   Other Topics Concern  . Not on file   Social History Narrative  . No narrative on file    Family History  Problem Relation Age of Onset  . Breast cancer Mother 33  . Depression Mother   . Hyperlipidemia Mother   . Hypothyroidism Mother   . Hypertension Mother   . Diabetes Father   . Hypertension Father   . Hypertension Brother   . Hypertension Sister     No current facility-administered medications for this visit.  No current outpatient prescriptions on file.  Facility-Administered Medications Ordered in Other Visits:  .  0.9 %  sodium chloride infusion, , Intravenous, Continuous, Sainani, Belia Heman, MD, Last Rate: 75 mL/hr at  11/26/16 0617 .  acetaminophen (TYLENOL) tablet 650 mg, 650 mg, Oral, Q6H PRN **OR** acetaminophen (TYLENOL) suppository 650 mg, 650 mg, Rectal, Q6H PRN, Sainani, Vivek J, MD .  buPROPion (WELLBUTRIN XL) 24 hr tablet 300 mg, 300 mg, Oral, Daily, Henreitta Leber, MD, 300 mg at 11/25/16 0903 .  busPIRone (BUSPAR) tablet 5 mg, 5 mg, Oral, TID, Henreitta Leber, MD, 5 mg at 11/25/16 2244 .  cefTRIAXone (ROCEPHIN) 1 g in dextrose 5 % 50 mL IVPB, 1 g, Intravenous, Q24H, Lenis Noon, RPH, Stopped at 11/25/16 1823 .  darifenacin (ENABLEX) 24 hr tablet 7.5 mg, 7.5 mg, Oral, Daily, Henreitta Leber, MD, 7.5 mg at 11/25/16 0904 .  enoxaparin (LOVENOX) injection 40 mg, 40 mg, Subcutaneous, Q24H, Sainani, Belia Heman, MD, 40 mg at 11/25/16 2244 .  fluticasone (FLONASE) 50 MCG/ACT nasal spray 2 spray, 2 spray, Each Nare, Daily, Henreitta Leber, MD, 2 spray at 11/25/16 0904 .  montelukast (SINGULAIR) tablet 10 mg, 10 mg, Oral, QHS, Sainani, Belia Heman, MD, 10 mg at 11/25/16 2244 .  nystatin (MYCOSTATIN/NYSTOP) topical powder, , Topical, QID, Sainani, Vivek J, MD .  ondansetron (ZOFRAN) tablet 4 mg, 4 mg, Oral, Q6H PRN **OR** ondansetron (ZOFRAN) injection 4 mg, 4 mg, Intravenous, Q6H PRN, Sainani, Vivek J, MD .  pantoprazole (PROTONIX) EC tablet 40 mg, 40 mg, Oral, Daily, Henreitta Leber, MD, 40 mg at 11/25/16 0902 .  potassium chloride SA (K-DUR,KLOR-CON) CR tablet 20 mEq, 20 mEq, Oral, Daily, Henreitta Leber, MD, 20 mEq at 11/25/16 0902 .  risperiDONE (RISPERDAL) tablet 0.5 mg, 0.5 mg, Oral, QHS, Sainani, Vivek J, MD, 0.5 mg at 11/25/16 2244 .  sertraline (ZOLOFT) tablet 25 mg, 25 mg, Oral, Daily, Henreitta Leber, MD, 25 mg at 11/25/16 0903 .  traZODone (DESYREL) tablet 50-100 mg, 50-100 mg, Oral, QHS, Sainani, Belia Heman, MD, 50 mg at 11/25/16 2244 .  zolpidem (AMBIEN) tablet 5 mg, 5 mg, Oral, QHS PRN, Henreitta Leber, MD  Physical exam:  Vitals:   11/24/16 1416  BP: (!) 129/92  Pulse: (!) 121  Resp: 18    Temp: (!) 97.1 F (36.2 C)  TempSrc: Tympanic  Weight: 163 lb (73.9 kg)   Physical Exam  Constitutional: She is well-developed, well-nourished, and in no distress.  Neck: Normal range of motion. Neck supple.  Cardiovascular: Normal rate and regular rhythm.   Pulmonary/Chest: Effort normal and breath sounds normal.  Abdominal: Soft. Bowel sounds are normal.  Neurological: She is disoriented. She displays weakness and tremor. She has an abnormal Finger-Nose-Finger Test.  Coordination abnormal.  Skin: Skin is warm and dry. Rash noted.  Psychiatric: Her affect is blunt. She exhibits a depressed mood. She exhibits disordered thought content, abnormal new learning ability, abnormal recent memory and abnormal remote memory.     CMP Latest Ref Rng & Units 11/25/2016  Glucose 65 - 99 mg/dL 84  BUN 6 - 20 mg/dL 10  Creatinine 0.44 - 1.00 mg/dL 0.77  Sodium 135 - 145 mmol/L 138  Potassium 3.5 - 5.1 mmol/L 3.2(L)  Chloride 101 - 111 mmol/L 108  CO2 22 - 32 mmol/L 24  Calcium 8.9 - 10.3 mg/dL 8.0(L)  Total Protein 6.5 - 8.1 g/dL -  Total Bilirubin 0.3 - 1.2 mg/dL -  Alkaline Phos 38 - 126 U/L -  AST 15 - 41 U/L -  ALT 14 - 54 U/L -   CBC Latest Ref Rng & Units 11/25/2016  WBC 3.6 - 11.0 K/uL 3.9  Hemoglobin 12.0 - 16.0 g/dL 13.0  Hematocrit 35.0 - 47.0 % 37.3  Platelets 150 - 440 K/uL 134(L)    No images are attached to the encounter.  Dg Abd 1 View  Result Date: 11/24/2016 CLINICAL DATA:  60 year old female with constipation. EXAM: ABDOMEN - 1 VIEW COMPARISON:  None. FINDINGS: There is moderate amount of stool throughout the colon. No bowel dilatation or evidence of obstruction. No free air or radiopaque calculi. Bilateral tubal ligation clips noted within the pelvis. The osseous structures and the soft tissues appear unremarkable. IMPRESSION: Constipation.  No radiographic evidence of bowel obstruction. Electronically Signed   By: Anner Crete M.D.   On: 11/24/2016 19:56   US  Abdomen Limited Ruq  Result Date: 11/26/2016 CLINICAL DATA:  Elevated bilirubin EXAM: ULTRASOUND ABDOMEN LIMITED RIGHT UPPER QUADRANT COMPARISON:  08/22/2012 FINDINGS: Gallbladder: No gallstones or wall thickening visualized. No sonographic Murphy sign noted by sonographer. Common bile duct: Diameter: 4 mm.  Where visualized, no filling defect. Liver: No focal lesion identified. Echogenic with some sparing seen about the gallbladder fossa. No evidence of intrahepatic duct dilatation. Portal vein is patent on color Doppler imaging with normal direction of blood flow towards the liver. IMPRESSION: 1. Negative gallbladder.  No bile duct dilatation. 2. Hepatic steatosis. Electronically Signed   By: Monte Fantasia M.D.   On: 11/26/2016 08:50     Assessment and plan- Patient is a 61 y.o. female who presents for altered mental status. She is in a wheelchair and accompanied by her husband and mother. When asked how she was she responded with  "good morning" but nothing further. Several additional questions were asked and she offered no response but did turn her head and look at me. She is sitting in her wheelchair staring ahead with sporadic body jerks. She exhibts cushingoid features but opens eyes when spoken to. She is tachycardic with HR manually 110. She is afebrile. Lungs are clear.    1. After discussing with Dr. Mike Gip and family a joint decision was made to have her directly admitted to the hospital for further management and care. We have several concerns for this patient. We unfortunately do not know if she was ever treated adequately for her urinary tract infection with Cipro because we are uncertain of how many doses she actually received. There is also a concern for  dehydration given she is not eating or drinking. Dr. Mike Gip has spoken to the family about hospice care in the past and this will probably need to be re-addressed inpatient.    Visit Diagnosis 1.  Disorientation   2. Glioblastoma  ([place])     Patient expressed understanding and was in agreement with this plan. She also understands that She can call clinic at any time with any questions, concerns, or complaints.   Greater than 50% of this 15 minute visit was spent on counseling/coordinating care for this patient.  Marisue Humble [facility] Pager- [phone] 11/26/2016 10:01 AM

## 2016-11-24 NOTE — H&P (Signed)
[place] [facility] [place] NAME: Jessica [name]    MR#:  [id number]  DATE OF BIRTH:  10-07-1955  DATE OF ADMISSION:  11/24/2016  PRIMARY CARE PHYSICIAN: Kirk Ruths, MD   REQUESTING/REFERRING PHYSICIAN: Dr. Nolon Stalls  CHIEF COMPLAINT:  No chief complaint on file. Altered mental status.  HISTORY OF PRESENT ILLNESS:  Jessica [name]  is a 61 y.o. female with a known history of glioblastoma status post craniotomy ongoing chemotherapy and radiation, hyperlipidemia, recent urinary tract infection, GERD, anxiety/depression, who presents to the hospital from her oncologist office due to altered mental status. Patient herself is a poor historian therefore most of the history obtained from the husband at bedside. As per the husband patient's mental status has been declining now for the past week or so. She has been less talkative and not as interactive. She was diagnosed with a urinary tract infection earlier last week and treated with 5 days ciprofloxacin but her mental status has not improved. She has had no fevers, no nausea no vomiting or abdominal pain or chest pain or shortness of breath or any other associated symptoms. Patient does have a history of anxiety/depression but none of her psychotropic meds have been increased or adjusted recently. Since her mental status has not improved she was sent to the hospital for direct admission.  PAST MEDICAL HISTORY:   Past Medical History:  Diagnosis Date  . Asthma    Pt does not use inhaler   . Elevated cholesterol   . Glioblastoma ([place])   . History of chicken pox   . History of measles   . History of mumps     PAST SURGICAL HISTORY:   Past Surgical History:  Procedure Laterality Date  . CT Scan of head  2009   negative  . FOOT SURGERY    . LAPAROSCOPY     abdominal endometriosis  . TUBAL LIGATION  1984    SOCIAL HISTORY:   Social History  Substance Use Topics  . Smoking status:  Never Smoker  . Smokeless tobacco: Never Used  . Alcohol use 0.0 oz/week     Comment: occasional use    FAMILY HISTORY:   Family History  Problem Relation Age of Onset  . Breast cancer Mother 26  . Depression Mother   . Hyperlipidemia Mother   . Hypothyroidism Mother   . Hypertension Mother   . Diabetes Father   . Hypertension Father   . Hypertension Brother   . Hypertension Sister     DRUG ALLERGIES:   Allergies  Allergen Reactions  . Compazine  [Prochlorperazine] Anaphylaxis    swelling, SOB.  . Augmentin [Amoxicillin-Pot Clavulanate] Nausea And Vomiting  . Oxycodone Nausea Only    REVIEW OF SYSTEMS:   Review of Systems  Unable to perform ROS: Mental acuity    MEDICATIONS AT HOME:   Prior to Admission medications   Medication Sig Start Date End Date Taking? Authorizing Provider  acetaminophen (TYLENOL) 325 MG tablet Take 975 mg by mouth every 6 (six) hours as needed.    [provider]  ALPRAZolam Duanne Moron) 1 MG tablet Take 1 tablet (1 mg total) by mouth daily as needed for anxiety. Patient not taking: Reported on 11/24/2016 09/04/16   Consuello Bossier, MD  atorvastatin (LIPITOR) 40 MG tablet TAKE 1 TABLET DAILY Patient not taking: Reported on 11/24/2016 03/28/15   Birdie Sons, MD  bacitracin 500 UNIT/GM ointment Apply 1 application topically daily.  [provider]  buPROPion (WELLBUTRIN XL) 300 MG 24 hr tablet Take 1 tablet (300 mg total) by mouth daily. 07/08/14   Birdie Sons, MD  busPIRone (BUSPAR) 5 MG tablet Take 1 tablet (5 mg total) by mouth 3 (three) times daily. 03/01/15   Birdie Sons, MD  cefUROXime (CEFTIN) 250 MG tablet  10/23/16   [provider]  ciprofloxacin (CIPRO) 500 MG tablet Take 1 tablet (500 mg total) by mouth 2 (two) times daily. Patient not taking: Reported on 11/24/2016 11/19/16   Karen Kitchens, NP  divalproex (DEPAKOTE) 500 MG DR tablet Take 500mg  (one tablet) twice daily through 9/24, then  decrease to 500mg  (one tablet) nightly 9/25-9/28, then stop. 10/23/16   [provider]  divalproex (DEPAKOTE) 500 MG DR tablet  10/23/16   [provider]  fluconazole (DIFLUCAN) 200 MG tablet Take 1 tablet (200 mg total) by mouth daily. Patient not taking: Reported on 11/24/2016 10/29/16   Noreene Filbert, MD  fluticasone [facility]) 50 MCG/ACT nasal spray SHAKE LIQUID AND USE 2 SPRAYS IN [facility] NOSTRIL DAILY Patient not taking: Reported on 11/24/2016 01/31/15   Birdie Sons, MD  HYDROcodone-homatropine [facility]) 5-1.5 MG/5ML syrup 5 ml 4-6 hours as needed for cough Patient not taking: Reported on 11/24/2016 02/22/15   Carmon Ginsberg, PA  mometasone (NASONEX) 50 MCG/ACT nasal spray Place 2 sprays into the nose daily.    [provider]  montelukast (SINGULAIR) 10 MG tablet Take 10 mg by mouth at bedtime.    [provider]  nystatin (MYCOSTATIN/NYSTOP) powder Apply topically 4 (four) times daily. Use x 5 days then PRN. 11/19/16   Karen Kitchens, NP  omeprazole (PRILOSEC) 20 MG capsule Take 1 capsule (20 mg total) by mouth daily. 07/08/14   Birdie Sons, MD  ondansetron (ZOFRAN-ODT) 8 MG disintegrating tablet Take 1 tablet (8 mg total) by mouth every 8 (eight) hours as needed for nausea or vomiting. Patient not taking: Reported on 11/24/2016 07/27/16   Sindy Guadeloupe, MD  polyethylene glycol [facility] Floria Raveling) packet Take 17 g by mouth daily as needed.    [provider]  potassium chloride SA (K-DUR,KLOR-CON) 20 MEQ tablet Take 1 tablet (20 mEq total) by mouth daily. 07/27/16   Sindy Guadeloupe, MD  risperiDONE (RISPERDAL) 0.5 MG tablet Take 0.5 mg by mouth at bedtime.  10/08/16   [provider]  sertraline (ZOLOFT) 25 MG tablet Take 25 mg by mouth daily.  07/09/16 07/09/17  [provider]  solifenacin (VESICARE) 10 MG tablet Take 10 mg by mouth daily.     [provider]  sulfamethoxazole-trimethoprim (BACTRIM DS,SEPTRA DS) 800-160  MG tablet Take 1 tablet by mouth 3 (three) times a week. Patient not taking: Reported on 11/24/2016 10/14/16   Karen Kitchens, NP  temozolomide (TEMODAR) 100 MG capsule Take 300 mg a day for 5 days every 28 days.  May take on an empty stomach or at bedtime to decrease nausea & vomiting. 10/19/16   Lequita Asal, MD  traZODone (DESYREL) 50 MG tablet Take 50-100 mg by mouth at bedtime.    [provider]  zolpidem (AMBIEN) 5 MG tablet TAKE 1 TABLET BY MOUTH EVERY NIGHT AT BEDTIME AS NEEDED FOR INSOMNIA 05/18/15   Birdie Sons, MD      VITAL SIGNS:  Blood pressure (!) 125/55, pulse 97, temperature (!) 97.5 F (36.4 C), temperature source Oral, resp. rate 18, SpO2 98 %.  PHYSICAL EXAMINATION:  Physical Exam  GENERAL:  61 y.o.-year-old patient lying in the bed lethargic/encephalopathic but in NAD.   EYES: Pupils equal, round, reactive to light and accommodation. No scleral icterus. Extraocular muscles intact.  HEENT: Head atraumatic, normocephalic. Oropharynx and nasopharynx clear. No oropharyngeal erythema, dry oral mucosa  NECK:  Supple, no jugular venous distention. No thyroid enlargement, no tenderness.  LUNGS: Normal breath sounds bilaterally, no wheezing, rales, rhonchi. No use of accessory muscles of respiration.  CARDIOVASCULAR: S1, S2 RRR. No murmurs, rubs, gallops, clicks.  ABDOMEN: Soft, nontender, nondistended. Bowel sounds present. No organomegaly or mass.  EXTREMITIES: No pedal edema, cyanosis, or clubbing. + 2 pedal & radial pulses b/l.   NEUROLOGIC: Cranial nerves II through XII are intact. No focal Motor or sensory deficits appreciated b/l. Globally weak.  PSYCHIATRIC: The patient is alert and oriented x 1. Flat Affect SKIN: No obvious rash, lesion, or ulcer.   LABORATORY PANEL:   CBC  Recent Labs Lab 11/19/16 1520  WBC 5.4  HGB 14.0  HCT 39.5  PLT 120*    ------------------------------------------------------------------------------------------------------------------  Chemistries   Recent Labs Lab 11/19/16 1520  NA 133*  K 4.2  CL 96*  CO2 27  GLUCOSE 132*  BUN 10  CREATININE 0.95  CALCIUM 9.3  AST 21  ALT 40  ALKPHOS 68  BILITOT 2.3*   ------------------------------------------------------------------------------------------------------------------  Cardiac Enzymes No results for input(s): TROPONINI in the last 168 hours. ------------------------------------------------------------------------------------------------------------------  RADIOLOGY:  No results found.   IMPRESSION AND PLAN:   61 year old female with past medical history of glioblastoma status post craniotomy with ongoing chemotherapy and radiation, anxiety/depression, hyperlipidemia, GERD, recent urinary tract infection who presents to the hospital due to altered mental status.  1. Altered mental status-etiology unclear presently. Patient did have a recent urinary tract infection but has been adequately treated with oral ciprofloxacin. This is likely metabolic encephalopathy but the source is unclear. -I will check a repeat urinalysis, treat her empirically with IV ceftriaxone. -We will also check routine blood work including a CBC med B, I will also check an ammonia level.  2. Urinary tract infection-patient had a recent Escherichia coli UTI which was adequately treated with oral Cipro. -I will repeat a urinalysis with culture, empirically treated with IV ceftriaxone. She is clinically afebrile presently.  3. History of anxiety/depression-continue Xanax, BuSpar, Zoloft, Risperdal. -Patient used to be on Depakote but doesn't seem to be taken presently. I will check a Depakote level.  4. Hyperlipidemia-continue atorvastatin.  5. GERD-continue Protonix.  6. History of glioblastoma status post craniotomy with currently undergoing chemotherapy-patient is  followed by Dr. Mike Gip. -I will place an oncology consult.  7. Urinary incontinence-continue Vesicare.    All the records are reviewed and case discussed with ED provider. Management plans discussed with the patient, family and they are in agreement.  CODE STATUS: Full code  TOTAL TIME TAKING CARE OF THIS PATIENT: 45 minutes.    Henreitta Leber M.D on 11/24/2016 at 4:59 PM  Between 7am to 6pm - Pager - [phone]  After 6pm go to [email or web address] - password [email or web address] [facility]  [facility]  Office  [phone]  CC: Primary care physician; Kirk Ruths, MD

## 2016-11-25 DIAGNOSIS — G9340 Encephalopathy, unspecified: Secondary | ICD-10-CM | POA: Diagnosis not present

## 2016-11-25 LAB — CBC
HEMATOCRIT: 37.3 % (ref 35.0–47.0)
HEMOGLOBIN: 13 g/dL (ref 12.0–16.0)
MCH: 34 pg (ref 26.0–34.0)
MCHC: 34.9 g/dL (ref 32.0–36.0)
MCV: 97.4 fL (ref 80.0–100.0)
Platelets: 134 10*3/uL — ABNORMAL LOW (ref 150–440)
RBC: 3.83 MIL/uL (ref 3.80–5.20)
RDW: 16.1 % — ABNORMAL HIGH (ref 11.5–14.5)
WBC: 3.9 10*3/uL (ref 3.6–11.0)

## 2016-11-25 LAB — BASIC METABOLIC PANEL
Anion gap: 6 (ref 5–15)
BUN: 10 mg/dL (ref 6–20)
CALCIUM: 8 mg/dL — AB (ref 8.9–10.3)
CHLORIDE: 108 mmol/L (ref 101–111)
CO2: 24 mmol/L (ref 22–32)
CREATININE: 0.77 mg/dL (ref 0.44–1.00)
GFR calc non Af Amer: >60 mL/min (ref >60)
Glucose, Bld: 84 mg/dL (ref 65–99)
POTASSIUM: 3.2 mmol/L — AB (ref 3.5–5.1)
SODIUM: 138 mmol/L (ref 135–145)

## 2016-11-25 LAB — URINE DRUG SCREEN, QUALITATIVE (ARMC ONLY)
Amphetamines, Ur Screen: NOT DETECTED
Barbiturates, Ur Screen: NOT DETECTED
Benzodiazepine, Ur Scrn: NOT DETECTED
CANNABINOID 50 NG, UR ~~LOC~~: NOT DETECTED
COCAINE METABOLITE, UR ~~LOC~~: NOT DETECTED
MDMA (ECSTASY) UR SCREEN: NOT DETECTED
Methadone Scn, Ur: NOT DETECTED
OPIATE, UR SCREEN: NOT DETECTED
Phencyclidine (PCP) Ur S: NOT DETECTED
TRICYCLIC, UR SCREEN: NOT DETECTED

## 2016-11-25 LAB — MAGNESIUM: Magnesium: 2.1 mg/dL (ref 1.7–2.4)

## 2016-11-25 LAB — HIV ANTIBODY (ROUTINE TESTING W REFLEX): HIV SCREEN 4TH GENERATION: NONREACTIVE

## 2016-11-25 NOTE — Progress Notes (Addendum)
[facility] NAME: Jessica [name]    MR#:  [id number]  DATE OF BIRTH:  [birth year]  SUBJECTIVE:  CHIEF COMPLAINT:  No chief complaint on file.  The patient is confused, only know her name. REVIEW OF SYSTEMS:  Review of Systems  Unable to perform ROS: Mental status change    DRUG ALLERGIES:   Allergies  Allergen Reactions  . Compazine  [Prochlorperazine] Anaphylaxis    swelling, SOB.  . Augmentin [Amoxicillin-Pot Clavulanate] Nausea And Vomiting  . Oxycodone Nausea Only   VITALS:  Blood pressure (!) 95/50, pulse 78, temperature 98.1 F (36.7 C), temperature source Oral, resp. rate 18, weight 165 lb 3.2 oz (74.9 kg), SpO2 98 %. PHYSICAL EXAMINATION:  Physical Exam  Constitutional: She is well-developed, well-nourished, and in no distress.  HENT:  Head: Normocephalic.  Mouth/Throat: Oropharynx is clear and moist.  Eyes: Pupils are equal, round, and reactive to light. Conjunctivae and EOM are normal. No scleral icterus.  Neck: Normal range of motion. Neck supple. No JVD present. No tracheal deviation present.  Cardiovascular: Normal rate, regular rhythm and normal heart sounds.  Exam reveals no gallop.   No murmur heard. Pulmonary/Chest: Effort normal and breath sounds normal. No respiratory distress. She has no wheezes. She has no rales.  Abdominal: Soft. Bowel sounds are normal. She exhibits no distension. There is no tenderness. There is no rebound.  Musculoskeletal: Normal range of motion. She exhibits no edema or tenderness.  Neurological: No cranial nerve deficit.  She is awake but confused,  follow commands.  Skin: No rash noted. No erythema.  Psychiatric:  Confused.   LABORATORY PANEL:  Female CBC  Recent Labs Lab 11/25/16 0623  WBC 3.9  HGB 13.0  HCT 37.3  PLT 134*   ------------------------------------------------------------------------------------------------------------------ Chemistries   Recent  Labs Lab 11/24/16 1652 11/25/16 0623  NA 137 138  K 4.1 3.2*  CL 100* 108  CO2 25 24  GLUCOSE 145* 84  BUN 11 10  CREATININE 1.00 0.77  CALCIUM 9.1 8.0*  MG  --  2.1  AST 21  --   ALT 40  --   ALKPHOS 62  --   BILITOT 2.0*  --    RADIOLOGY:  Dg Abd 1 View  Result Date: 11/24/2016 CLINICAL DATA:  61 year old female with constipation. EXAM: ABDOMEN - 1 VIEW COMPARISON:  None. FINDINGS: There is moderate amount of stool throughout the colon. No bowel dilatation or evidence of obstruction. No free air or radiopaque calculi. Bilateral tubal ligation clips noted within the pelvis. The osseous structures and the soft tissues appear unremarkable. IMPRESSION: Constipation.  No radiographic evidence of bowel obstruction. Electronically Signed   By: Anner Crete M.D.   On: 11/24/2016 19:56   ASSESSMENT AND PLAN:   61 year old female with past medical history of glioblastoma status post craniotomy with ongoing chemotherapy and radiation, anxiety/depression, hyperlipidemia, GERD, recent urinary tract infection who presents to the hospital due to altered mental status.  1. Altered mental status, likely metabolic encephalopathy but the source is unclear.  Repeated urinalysis: Unremarkable, on IV ceftriaxone. Ammonia level is normal. Check urine drug screen. Aspiration and fall precaution.  2. Urinary tract infection-patient had a recent Escherichia coli UTI which was adequately treated with oral Cipro. Repeated urinalysis: Unremarkable, on IV ceftriaxone.  3. History of anxiety/depression-continue Xanax, BuSpar, Zoloft, Risperdal. -Patient used to be on Depakote but doesn't seem to be taken presently. Follow-up Depakote level.  4. Hyperlipidemia-continue atorvastatin.  5. GERD-continue Protonix.  6. History of glioblastoma status post craniotomy with currently undergoing chemotherapy-patient is followed by Dr. Mike Gip. -I will place an oncology consult.  7. Urinary  incontinence-continue Vesicare.  Hypertension. Continue normal saline IV and follow-up potassium. Hypokalemia. Give potassium supplement and a follow-up level. Magnesium level is normal. Thrombocytopenia. Looks like since last month, stable.  Elevated bilirubin level. Looks like chronic. But no imaging of abdomen before. Follow-up abdominal ultrasound.  All the records are reviewed and case discussed with Care Management/Social Worker. Management plans discussed with the patient, family and they are in agreement.  CODE STATUS: Full Code  TOTAL TIME TAKING CARE OF THIS PATIENT: 28 minutes.   More than 50% of the time was spent in counseling/coordination of care: YES  POSSIBLE D/C IN 1-2 DAYS, DEPENDING ON CLINICAL CONDITION.   Demetrios Loll M.D on 11/25/2016 at 12:08 PM  Between 7am to 6pm - Pager - [phone]  After 6pm go to [email or web address] - [facility]

## 2016-11-25 NOTE — Progress Notes (Signed)
Physical Therapy Evaluation Patient Details Name: Jessica [name] MRN: [id number] DOB: [birth year] Today's Date: 11/25/2016   History of Present Illness  Pt admitted for AMS, reports of decreased mental status the past week. PMH of glioblastoma s/p craniotomy with ongoing chemo and treatment, hyperlipidemia, GERD, anxiety/depression, recent UTI treated with Ciprofloxacin.   Clinical Impression  Pt is a pleasant 61 year old female admitted for AMS. Pt able to respond to simple questions, however is a poor historian, history and PLOF obtained from mother who was present in room. Pt performed bed mobility with mod assist, transfers/amb with RW with mod assist, +2 for safety. Pt transferred from seated EOB to [facility] for toileting, and then transferred to recliner. Pt requires continuous cues for proper sequencing with mobility, responds best to simple one-worded commands and tactile cues. Pt unsteady on feet and grasps onto RW tightly due to fear of falling, provided reassurance. Pt demonstrates deficits with strength/endurance/bed mobility/transfers/amb. Would benefit from further PT services to promote optimal return to home. Recommend transition to SNF upon DC from acute hospitalization. This entire session was guided, instructed, and directly supervised by Greggory Stallion, DPT.    Follow Up Recommendations SNF    Equipment Recommendations  None recommended by PT (pt has RW and manual WC)    Recommendations for Other Services       Precautions / Restrictions Precautions Precautions: Fall Restrictions Weight Bearing Restrictions: No      Mobility  Bed Mobility Overal bed mobility: Needs Assistance Bed Mobility: Supine to Sit     Supine to sit: Mod assist     General bed mobility comments: Pt able to roll onto L side with assistance to reach R arm over, able to bring LEs off the bed.  Assist to guide shoulders off of bed to seated position. Pt required hand held assistance to roll and sit  up. Tactile and verbal cues to scoot to EOB  Transfers Overall transfer level: Needs assistance Equipment used: Rolling walker (2 wheeled) Transfers: Sit to/from Stand Sit to Stand: Mod assist;+2 physical assistance;+2 safety/equipment         General transfer comment: Pt responded best to simple cues to "stand up". Able to rise to standing with support from RW. Pt braces LEs against bed for support. Pt able to transfer to/from [facility]. Cues to bend knees during lowering as pt keeps knees extended.   Ambulation/Gait Ambulation/Gait assistance: Mod assist;+2 physical assistance;+2 safety/equipment Ambulation Distance (Feet): 2 Feet Assistive device: Rolling walker (2 wheeled) Gait Pattern/deviations: Leaning posteriorly     General Gait Details: Pt amb with extended trunk and stiff legs, takes short steps, appears to rock side to side to move feet. Relies on RW for support and balance. Cues for step sequence, delayed response to perform movement, responds best to tactile cues.   Stairs            Wheelchair Mobility    Modified Rankin (Stroke Patients Only)       Balance Overall balance assessment: Needs assistance Sitting-balance support: Feet supported Sitting balance-Leahy Scale: Fair Sitting balance - Comments: Pt requires assistance to get into seated position at EOB or BSC, however has the trunk control to maintain position for several minutes.    Standing balance support: Bilateral upper extremity supported Standing balance-Leahy Scale: Fair Standing balance comment: Pt able to support bodyweight in standing with support from RW, demonstrates stiff legs and extended trunk, slightly unsteady on feet  Pertinent Vitals/Pain Pain Assessment: No/denies pain    Home Living Family/patient expects to be discharged to:: Private residence Living Arrangements: Spouse/significant other;Children (two grown sons) Available Help at  Discharge: Family Type of Home: Mobile home Home Access: Stairs to enter;Other (comment) (Installing ramp, will be completed in November) Entrance Stairs-Rails: Left;Right Entrance Stairs-Number of Steps: 6 steps at front entrance, 7 steps at rear entrance Home Layout: One level Home Equipment: Environmental consultant - 2 wheels;Wheelchair - manual Additional Comments: Pt uses a manual WC for amb within the home, able to amb with RW with assistance. Husband and two sons assist patient in/out of the home by lifting pt in [facility] up/down the stairs. Plans for ramp to be installed in November. Pt uses diapers and occasionally BSC for toileting.    Prior Function Level of Independence: Needs assistance         Comments: Pt has assistance from family to perform ADLs and amb within/out of home. Pt's mother reports pt spends majority of time in the home, has PT at home twice a week, practices amb in the home with RW.      Hand Dominance        Extremity/Trunk Assessment   Upper Extremity Assessment Upper Extremity Assessment: Generalized weakness (UE MMT grossly 3+/5)    Lower Extremity Assessment Lower Extremity Assessment: Generalized weakness;Difficult to assess due to impaired cognition (LE MMT 3/5)       Communication   Communication: Other (comment) (Difficulty responding to questions due to AMS)  Cognition Arousal/Alertness: Lethargic Behavior During Therapy: WFL for tasks assessed/performed Overall Cognitive Status: Impaired/Different from baseline                                 General Comments: Pt is a poor historian (history per pt's mothers), appears confused, able to follow simple commands with tactile and visual cues.       General Comments      Exercises Other Exercises Other Exercises: Pt transfered to [facility] for toileting duties, +2 assist, RN present to assist with set up    Assessment/Plan    PT Assessment Patient needs continued PT services  PT Problem List  Decreased strength;Decreased range of motion;Decreased activity tolerance;Decreased balance;Decreased mobility;Decreased coordination;Decreased cognition;Decreased knowledge of use of DME       PT Treatment Interventions DME instruction;Gait training;Stair training;Functional mobility training;Therapeutic activities;Therapeutic exercise;Balance training;Patient/family education    PT Goals (Current goals can be found in the Care Plan section)  Acute Rehab PT Goals PT Goal Formulation: Patient unable to participate in goal setting Time For Goal Achievement: 12/09/16 Potential to Achieve Goals: Good    Frequency Min 2X/week   Barriers to discharge        Co-evaluation               AM-PAC PT "6 Clicks" Daily Activity  Outcome Measure Difficulty turning over in bed (including adjusting bedclothes, sheets and blankets)?: Unable Difficulty moving from lying on back to sitting on the side of the bed? : Unable Difficulty sitting down on and standing up from a chair with arms (e.g., wheelchair, bedside commode, etc,.)?: Unable Help needed moving to and from a bed to chair (including a wheelchair)?: A Lot Help needed walking in hospital room?: A Lot Help needed climbing 3-5 steps with a railing? : Total 6 Click Score: 8    End of Session Equipment Utilized During Treatment: Gait belt Activity Tolerance: Patient tolerated treatment  well Patient left: in chair;with call bell/phone within reach;with chair alarm set;with family/visitor present Nurse Communication: Mobility status;Other (comment) (+2 assist with RW to transfer pt from chair to bed) PT Visit Diagnosis: Unsteadiness on feet (R26.81);Other abnormalities of gait and mobility (R26.89);Muscle weakness (generalized) (M62.81)    Time: 3419-3790 PT Time Calculation (min) (ACUTE ONLY): 38 min   Charges:         PT G Codes:   PT G-Codes **NOT FOR INPATIENT CLASS** Functional Assessment Tool Used: AM-PAC 6 Clicks Basic  Mobility Functional Limitation: Mobility: Walking and moving around Mobility: Walking and Moving Around Current Status (W4097): At least 80 percent but less than 100 percent impaired, limited or restricted Mobility: Walking and Moving Around Goal Status [phone]): At least 60 percent but less than 80 percent impaired, limited or restricted    Manfred Arch, SPT   Manfred Arch 11/25/2016, 5:22 PM

## 2016-11-26 ENCOUNTER — Observation Stay: Payer: BLUE CROSS/BLUE SHIELD

## 2016-11-26 DIAGNOSIS — F410 Panic disorder [episodic paroxysmal anxiety] without agoraphobia: Secondary | ICD-10-CM

## 2016-11-26 DIAGNOSIS — R531 Weakness: Secondary | ICD-10-CM | POA: Diagnosis not present

## 2016-11-26 DIAGNOSIS — K5900 Constipation, unspecified: Secondary | ICD-10-CM | POA: Diagnosis not present

## 2016-11-26 DIAGNOSIS — H547 Unspecified visual loss: Secondary | ICD-10-CM

## 2016-11-26 DIAGNOSIS — Z923 Personal history of irradiation: Secondary | ICD-10-CM

## 2016-11-26 DIAGNOSIS — G9340 Encephalopathy, unspecified: Secondary | ICD-10-CM | POA: Diagnosis not present

## 2016-11-26 DIAGNOSIS — R5382 Chronic fatigue, unspecified: Secondary | ICD-10-CM

## 2016-11-26 DIAGNOSIS — Z79899 Other long term (current) drug therapy: Secondary | ICD-10-CM

## 2016-11-26 DIAGNOSIS — Z9221 Personal history of antineoplastic chemotherapy: Secondary | ICD-10-CM

## 2016-11-26 DIAGNOSIS — R4182 Altered mental status, unspecified: Secondary | ICD-10-CM | POA: Diagnosis not present

## 2016-11-26 DIAGNOSIS — Z792 Long term (current) use of antibiotics: Secondary | ICD-10-CM | POA: Diagnosis not present

## 2016-11-26 DIAGNOSIS — C719 Malignant neoplasm of brain, unspecified: Secondary | ICD-10-CM | POA: Diagnosis not present

## 2016-11-26 DIAGNOSIS — N390 Urinary tract infection, site not specified: Secondary | ICD-10-CM

## 2016-11-26 DIAGNOSIS — Z9114 Patient's other noncompliance with medication regimen: Secondary | ICD-10-CM

## 2016-11-26 DIAGNOSIS — R630 Anorexia: Secondary | ICD-10-CM

## 2016-11-26 LAB — TSH: TSH: 1.446 u[IU]/mL (ref 0.350–4.500)

## 2016-11-26 LAB — VITAMIN B12: VITAMIN B 12: 66 pg/mL — AB (ref 180–914)

## 2016-11-26 MED ORDER — PREMIER PROTEIN SHAKE
11.0000 [oz_av] | Freq: Two times a day (BID) | ORAL | Status: DC
  Administered 2016-11-28: 10:00:00 11 [oz_av] via ORAL

## 2016-11-26 MED ORDER — BISACODYL 10 MG RE SUPP
10.0000 mg | Freq: Every day | RECTAL | Status: DC | PRN
  Administered 2016-11-26: 10 mg via RECTAL
  Filled 2016-11-26: qty 1

## 2016-11-26 MED ORDER — GADOBENATE DIMEGLUMINE 529 MG/ML IV SOLN
15.0000 mL | Freq: Once | INTRAVENOUS | Status: AC | PRN
  Administered 2016-11-26: 15 mL via INTRAVENOUS

## 2016-11-26 MED ORDER — TRAZODONE HCL 50 MG PO TABS
50.0000 mg | ORAL_TABLET | Freq: Every day | ORAL | Status: DC
  Administered 2016-11-26 – 2016-11-27 (×2): 50 mg via ORAL
  Filled 2016-11-26 (×2): qty 1

## 2016-11-26 NOTE — Progress Notes (Signed)
Medications administered by student RN [id number] with supervision of Clinical Instructor Danyel Griess MSN, RN-BC or patient's assigned RN.   

## 2016-11-26 NOTE — Clinical Social Work Placement (Addendum)
   CLINICAL SOCIAL WORK PLACEMENT  NOTE  Date:  11/26/2016  Patient Details  Name: Jessica [name] MRN: [id number] Date of Birth: February 27, 1955  Clinical Social Work is seeking post-discharge placement for this patient at the [place] level of care (*CSW will initial, date and re-position this form in  chart as items are completed):  Yes   Patient/family provided with [facility] Work Department's list of facilities offering this level of care within the geographic area requested by the patient (or if unable, by the patient's family).  Yes   Patient/family informed of their freedom to choose among providers that offer the needed level of care, that participate in Medicare, Medicaid or managed care program needed by the patient, have an available bed and are willing to accept the patient.  Yes   Patient/family informed of [facility]'s ownership interest in [facility] and [facility], as well as of the fact that they are under no obligation to receive care at these facilities.  PASRR submitted to EDS on 11/26/16     PASRR number received on  11/27/16     Existing PASRR number confirmed on   [id number]     FL2 transmitted to all facilities in geographic area requested by pt/family on 11/26/16     FL2 transmitted to all facilities within larger geographic area on    11/26/16   Patient informed that his/her managed care company has contracts with or will negotiate with certain facilities, including the following:        Yes   Patient/family informed of bed offers received.  Patient chooses bed at  ([facility] )     Physician recommends and patient chooses bed at     [facility] Patient to be transferred to [facility]   on  October 27/18.  Patient to be transferred to facility by  EMS     Patient family notified on  October 27/18 of transfer.  Name of family member notified:   Her husband [facility]     PHYSICIAN       Additional Comment:     _______________________________________________ Sample, Veronia Beets, LCSW 11/26/2016, 5:00 PM

## 2016-11-26 NOTE — Progress Notes (Signed)
Per [facility] liaison [facility] authorization has been received.  [employer], LCSW [phone]

## 2016-11-26 NOTE — Evaluation (Signed)
Clinical/Bedside Swallow Evaluation Patient Details  Name: Jessica [name] MRN: [id number] Date of Birth: 1955-03-08  Today's Date: 11/26/2016 Time: SLP Start Time (ACUTE ONLY): 95 SLP Stop Time (ACUTE ONLY): 1230 SLP Time Calculation (min) (ACUTE ONLY): 60 min  Past Medical History:  Past Medical History:  Diagnosis Date  . Asthma    Pt does not use inhaler   . Elevated cholesterol   . Glioblastoma ([place])   . History of chicken pox   . History of measles   . History of mumps    Past Surgical History:  Past Surgical History:  Procedure Laterality Date  . CT Scan of head  2009   negative  . FOOT SURGERY    . LAPAROSCOPY     abdominal endometriosis  . TUBAL LIGATION  1984   HPI:  Pt is a 61 y.o. female with a known history of glioblastoma status post craniotomy ongoing chemotherapy and radiation, hyperlipidemia, recent urinary tract infection, GERD, anxiety/depression, who presents to the hospital from her oncologist office due to altered mental status. Patient herself is a poor historian therefore most of the history obtained from the husband at bedside. As per the husband patient's mental status has been declining now for the past week or so. She has been less talkative and not as interactive. She was diagnosed with a urinary tract infection earlier last week and treated with 5 days ciprofloxacin but her mental status has not improved. She has had no fevers, no nausea no vomiting or abdominal pain or chest pain or shortness of breath or any other associated symptoms. Patient does have a history of anxiety/depression but none of her psychotropic meds have been increased or adjusted recently. Since her mental status has not improved she was sent to the hospital for direct admission.   Assessment / Plan / Recommendation Clinical Impression  Pt appears to present w/ mild oral phase Dysphagia w/ a mild-moderate risk for aspiration. Pharyngeal swallow function can be impacted by oral  phase deficits. Per chart review, pt has past medical hx of glioblastoma status post craniotomy ongoing chemotherapy and radiation, hyperlipidemia, recent urinary tract infection, GERD, and anxiety/depression. Pt appears to exhibited Cognitive decline c/b decreased orientation and attention to tasks. Pt's mother was present during evaluation.  Pt was given trials of thin liquid via straw, puree, and soft solids. During all po trials, Pt needed orientation to the drinking task and moderate verbal/tactile cues to initiate tasks. During trials of thin liquids via straw, pt exhibited timely A-P transit and swallow function given these trials. No overt, immediate s/s of aspiration were noted. No decline in vocal quality or respiratory status were noted. During trials of puree, pt exhibited min oral holding and min prolonged A-P transit. No overt, immediate s/s of aspiration were noted. No decline in vocal quality or respiratory status were noted. During trials of soft solids (graham cracker moistened w/ ice cream), pt exhibited munching mastication pattern, mod oral holding, and mod prolonged A-P transit. No overt, immediate s/s of aspiration were noted. No decline in vocal quality or respiratory status were noted. Pt's pharyngeal phase given all trials appears grossly WFL, however, oral phase swallow function deficits can impact the pharyngeal phase swallow function. Pt's Cognitive decline and inattention to task can also increase risk for choking. Pt required max assist during feeding session.  ST educated on aspiration precautions - sitting upright, small bites/sips, making sure pt is alert and oriented of task before eating/drinking, reducing environmental stimulation, checking mouth for  oral clearing; ST also educated on fatigue and recommended taking rest breaks throughout the meal and monitoring pt's cues. Pt's drinking of liquids appeared efficient, however, pt appeared to fatigue w/ texture increase in foods  d/t an increase in energy exertion and attention to task. Dietician support could f/u w/ recommendation of a drink supplement for nutritional needs which would be easier for overall conservation of energy.  D/t pt's current presentation and Cognitive status, Recommend Dysphagia level 1 diet w/ thin liquids; aspiration precautions. Recommend reflux precautions d/t GERD diagnosis (per chart review). Recommend max assist and support during feeding sessions - checking mouth for oral clearing, monitor for cues and fatigue, give rest breaks during meals.  ST services will f/u w/ pt status, diet toleration, and education while admitted. NSG/MD updated.   SLP Visit Diagnosis: Dysphagia, oral phase (R13.11)    Aspiration Risk  Mild aspiration risk (-moderate)    Diet Recommendation  Dysphagia level 1 w/ thin liquids; aspiration precautions; reflux precautions.    Medication Administration: Crushed with puree (as able)    Other  Recommendations Recommended Consults:  (Dietician f/u; palliative care consult) Oral Care Recommendations: Oral care BID;Staff/trained caregiver to provide oral care   Follow up Recommendations  (TBD)      Frequency and Duration min 2x/week  1 week       Prognosis Prognosis for Safe Diet Advancement: Fair (-Good) Barriers to Reach Goals: Severity of deficits      Swallow Study   General Date of Onset: 11/24/16 HPI: Pt is a 61 y.o. female with a known history of glioblastoma status post craniotomy ongoing chemotherapy and radiation, hyperlipidemia, recent urinary tract infection, GERD, anxiety/depression, who presents to the hospital from her oncologist office due to altered mental status. Patient herself is a poor historian therefore most of the history obtained from the husband at bedside. As per the husband patient's mental status has been declining now for the past week or so. She has been less talkative and not as interactive. She was diagnosed with a urinary tract  infection earlier last week and treated with 5 days ciprofloxacin but her mental status has not improved. She has had no fevers, no nausea no vomiting or abdominal pain or chest pain or shortness of breath or any other associated symptoms. Patient does have a history of anxiety/depression but none of her psychotropic meds have been increased or adjusted recently. Since her mental status has not improved she was sent to the hospital for direct admission. Type of Study: Bedside Swallow Evaluation Previous Swallow Assessment: none reported Diet Prior to this Study: Regular;Thin liquids Temperature Spikes Noted: No (wbc WNL) Respiratory Status: Room air History of Recent Intubation: No Behavior/Cognition: Alert;Cooperative;Pleasant mood;Distractible;Requires cueing Oral Cavity Assessment: Within Functional Limits Oral Care Completed by SLP: Recent completion by staff Oral Cavity - Dentition: Adequate natural dentition Vision: Functional for self-feeding Self-Feeding Abilities: Able to feed self;Needs assist;Needs set up Patient Positioning: Upright in bed Baseline Vocal Quality: Normal Volitional Cough: Strong Volitional Swallow: Able to elicit    Oral/Motor/Sensory Function Overall Oral Motor/Sensory Function: Within functional limits   Ice Chips Ice chips: Not tested   Thin Liquid Thin Liquid: Within functional limits Presentation: Spoon;Self Fed;Straw (hand over hand support; 5 trials)    Nectar Thick Nectar Thick Liquid: Not tested   Honey Thick Honey Thick Liquid: Not tested   Puree Puree: Impaired (4 trials ) Presentation: Spoon (assist required) Oral Phase Impairments: Poor awareness of bolus;Reduced lingual movement/coordination Oral Phase Functional Implications: Prolonged  oral transit;Oral holding   Solid   GO   Solid: Impaired Presentation: Spoon (assist required; 4 trials) Oral Phase Impairments: Reduced lingual movement/coordination;Poor awareness of bolus;Impaired  mastication Oral Phase Functional Implications: Oral holding;Prolonged oral transit;Impaired mastication        Carolynn Sayers, SLP-Graduate Student Carolynn Sayers 11/26/2016,1:21 PM   This information has been reviewed and agreed upon by this supervising clinician.  This patient note, response to treatment and overall treatment plan has been reviewed and this clinician agrees with the information provided.   11/26/16, 3:34 PM [place], [place], CCC-SLP

## 2016-11-26 NOTE — Consult Note (Signed)
Reason for Consult:AMS Referring Physician: Mike Gip  CC: AMS  HPI: Jessica [name] is an 61 y.o. female with a history of GBS s/p resection, undergoing chemo who per the mother has had a progressive decline since resection in May of this year.  This became particularly worse about 3 weeks ago.  They brought her to the ED because they were no longer abe to manage her at home.  They could not move her.  She was not eating or drinking.  Consult called for further recommendations.  Last MR imaging in September showed progression of tumor with cross of the midline noted.    Past Medical History:  Diagnosis Date  . Asthma    Pt does not use inhaler   . Elevated cholesterol   . Glioblastoma ([place])   . History of chicken pox   . History of measles   . History of mumps     Past Surgical History:  Procedure Laterality Date  . CT Scan of head  2009   negative  . FOOT SURGERY    . LAPAROSCOPY     abdominal endometriosis  . TUBAL LIGATION  1984    Family History  Problem Relation Age of Onset  . Breast cancer Mother 64  . Depression Mother   . Hyperlipidemia Mother   . Hypothyroidism Mother   . Hypertension Mother   . Diabetes Father   . Hypertension Father   . Hypertension Brother   . Hypertension Sister     Social History:  reports that she has never smoked. She has never used smokeless tobacco. She reports that she drinks alcohol. She reports that she does not use drugs.  Allergies  Allergen Reactions  . Compazine  [Prochlorperazine] Anaphylaxis    swelling, SOB.  . Augmentin [Amoxicillin-Pot Clavulanate] Nausea And Vomiting  . Oxycodone Nausea Only    Medications:  I have reviewed the patient's current medications. Prior to Admission:  Prescriptions Prior to Admission  Medication Sig Dispense Refill Last Dose  . atorvastatin (LIPITOR) 40 MG tablet TAKE 1 TABLET DAILY 90 tablet 4 11/23/2016 at Unknown time  . buPROPion (WELLBUTRIN XL) 300 MG 24 hr tablet Take 1 tablet  (300 mg total) by mouth daily. 90 tablet 3 11/23/2016 at Unknown time  . busPIRone (BUSPAR) 5 MG tablet Take 1 tablet (5 mg total) by mouth 3 (three) times daily. 270 tablet 3 11/23/2016 at Unknown time  . dexamethasone (DECADRON) 2 MG tablet Take 1 tablet by mouth daily.  0 11/23/2016 at Unknown time  . divalproex (DEPAKOTE) 500 MG DR tablet Take 500mg  (one tablet) twice daily through 9/24, then decrease to 500mg  (one tablet) nightly 9/25-9/28, then stop.   11/23/2016 at Unknown time  . montelukast (SINGULAIR) 10 MG tablet Take 10 mg by mouth at bedtime.   11/23/2016 at Unknown time  . nystatin (MYCOSTATIN/NYSTOP) powder Apply topically 4 (four) times daily. Use x 5 days then PRN. 15 g 1 11/23/2016 at Unknown time  . omeprazole (PRILOSEC) 20 MG capsule Take 1 capsule (20 mg total) by mouth daily. 90 capsule 3 11/23/2016 at Unknown time  . potassium chloride SA (K-DUR,KLOR-CON) 20 MEQ tablet Take 1 tablet (20 mEq total) by mouth daily. 7 tablet 0 11/23/2016 at Unknown time  . risperiDONE (RISPERDAL) 0.5 MG tablet Take 0.5 mg by mouth at bedtime.    11/23/2016 at Unknown time  . sertraline (ZOLOFT) 25 MG tablet Take 25 mg by mouth daily.    11/23/2016 at Unknown time  .  solifenacin (VESICARE) 10 MG tablet Take 10 mg by mouth daily.    11/23/2016 at Unknown time  . temozolomide (TEMODAR) 100 MG capsule Take 300 mg a day for 5 days every 28 days.  May take on an empty stomach or at bedtime to decrease nausea & vomiting. 15 capsule 0 Past Month at Unknown time  . traZODone (DESYREL) 50 MG tablet Take 50-100 mg by mouth at bedtime.   11/23/2016 at Unknown time  . zolpidem (AMBIEN) 5 MG tablet TAKE 1 TABLET BY MOUTH EVERY NIGHT AT BEDTIME AS NEEDED FOR INSOMNIA 30 tablet 5 11/23/2016 at Unknown time  . acetaminophen (TYLENOL) 325 MG tablet Take 975 mg by mouth every 6 (six) hours as needed.   prn at prn  . ALPRAZolam (XANAX) 1 MG tablet Take 1 tablet (1 mg total) by mouth daily as needed for anxiety.  (Patient not taking: Reported on 11/24/2016) 30 tablet 0 Not Taking at Unknown time  . ciprofloxacin (CIPRO) 500 MG tablet Take 1 tablet (500 mg total) by mouth 2 (two) times daily. (Patient not taking: Reported on 11/24/2016) 14 tablet 0 Not Taking at Unknown time  . fluconazole (DIFLUCAN) 200 MG tablet Take 1 tablet (200 mg total) by mouth daily. (Patient not taking: Reported on 11/24/2016) 3 tablet 0 Not Taking at Unknown time  . fluticasone (FLONASE) 50 MCG/ACT nasal spray SHAKE LIQUID AND USE 2 SPRAYS IN EACH NOSTRIL DAILY (Patient not taking: Reported on 11/24/2016) 16 g 5 Not Taking at Unknown time  . HYDROcodone-homatropine (HYCODAN) 5-1.5 MG/5ML syrup 5 ml 4-6 hours as needed for cough (Patient not taking: Reported on 11/24/2016) 240 mL 0 Not Taking at Unknown time  . ondansetron (ZOFRAN-ODT) 8 MG disintegrating tablet Take 1 tablet (8 mg total) by mouth every 8 (eight) hours as needed for nausea or vomiting. (Patient not taking: Reported on 11/24/2016) 30 tablet 1 Not Taking at Unknown time  . polyethylene glycol (MIRALAX / GLYCOLAX) packet Take 17 g by mouth daily as needed.   prn at prn  . sulfamethoxazole-trimethoprim (BACTRIM DS,SEPTRA DS) 800-160 MG tablet Take 1 tablet by mouth 3 (three) times a week. (Patient not taking: Reported on 11/24/2016) 30 tablet 0 Not Taking at Unknown time   Scheduled: . buPROPion  300 mg Oral Daily  . busPIRone  5 mg Oral TID  . darifenacin  7.5 mg Oral Daily  . enoxaparin (LOVENOX) injection  40 mg Subcutaneous Q24H  . fluticasone  2 spray Each Nare Daily  . montelukast  10 mg Oral QHS  . nystatin   Topical QID  . pantoprazole  40 mg Oral Daily  . potassium chloride SA  20 mEq Oral Daily  . risperiDONE  0.5 mg Oral QHS  . sertraline  25 mg Oral Daily  . traZODone  50-100 mg Oral QHS    ROS: History obtained from the patient and and mother  General ROS: not eating or drinking Psychological ROS: behavioral disorder Ophthalmic ROS: negative for  - blurry vision, double vision, eye pain or loss of vision ENT ROS: negative for - epistaxis, nasal discharge, oral lesions, sore throat, tinnitus or vertigo Allergy and Immunology ROS: negative for - hives or itchy/watery eyes Hematological and Lymphatic ROS: negative for - bleeding problems, bruising or swollen lymph nodes Endocrine ROS: negative for - galactorrhea, hair pattern changes, polydipsia/polyuria or temperature intolerance Respiratory ROS: negative for - cough, hemoptysis, shortness of breath or wheezing Cardiovascular ROS: negative for - chest pain, dyspnea on exertion, edema or  irregular heartbeat Gastrointestinal ROS: negative for - abdominal pain, diarrhea, hematemesis, nausea/vomiting or stool incontinence Genito-Urinary ROS: negative for - dysuria, hematuria, incontinence or urinary frequency/urgency Musculoskeletal ROS: negative for - joint swelling or muscular weakness Neurological ROS: as noted in HPI Dermatological ROS: negative for rash and skin lesion changes  Physical Examination: Blood pressure (!) 128/59, pulse 82, temperature 97.8 F (36.6 C), resp. rate 20, height 5\' 4"  (1.626 m), weight 73 kg (161 lb), SpO2 100 %.  HEENT-  Normocephalic, no lesions, without obvious abnormality.  Normal external eye and conjunctiva.  Normal TM's bilaterally.  Normal auditory canals and external ears. Normal external nose, mucus membranes and septum.  Normal pharynx. Cardiovascular- S1, S2 normal, pulses palpable throughout   Lungs- chest clear, no wheezing, rales, normal symmetric air entry Abdomen- soft, non-tender; bowel sounds normal; no masses,  no organomegaly Extremities- no edema Lymph-no adenopathy palpable Musculoskeletal-no joint tenderness, deformity or swelling Skin-warm and dry, no hyperpigmentation, vitiligo, or suspicious lesions  Neurological Examination   Mental Status: Patient lying in bed with eyes closed but responds to verbal stimuli.  Knows who she is  but does not recognize her mother and knows she is in [place] but does not know she is in a hospital Cranial Nerves: II: Discs flat bilaterally; Does not blink to bilateral confrontation.  Has difficulty finding my hand on the left but does know that it is there.  Finds hand easily on the right.  Pupils equal, round, reactive to light and accommodation III,IV, VI: ptosis not present, extra-ocular motions intact bilaterally V,VII: smile symmetric, facial light touch sensation normal bilaterally VIII: hearing normal bilaterally IX,X: gag reflex present XI: bilateral shoulder shrug XII: midline tongue extension Motor: Lifts all extremities against gravity.  Laying on her left therefore unable to determine if strength symmetric.   Sensory: Pinprick and light touch intact throughout, bilaterally Deep Tendon Reflexes: 2+ and symmetric throughout Plantars: Right: downgoing   Left: downgoing Cerebellar: Unable to test due to patient's willingness to keep her eyes open Gait: not tested due to safety concerns    Laboratory Studies:   Basic Metabolic Panel:  Recent Labs Lab 11/19/16 1520 11/24/16 1652 11/25/16 0623  NA 133* 137 138  K 4.2 4.1 3.2*  CL 96* 100* 108  CO2 27 25 24   GLUCOSE 132* 145* 84  BUN 10 11 10   CREATININE 0.95 1.00 0.77  CALCIUM 9.3 9.1 8.0*  MG  --   --  2.1    Liver Function Tests:  Recent Labs Lab 11/19/16 1520 11/24/16 1652  AST 21 21  ALT 40 40  ALKPHOS 68 62  BILITOT 2.3* 2.0*  PROT 6.3* 6.2*  ALBUMIN 3.7 3.8   No results for input(s): LIPASE, AMYLASE in the last 168 hours.  Recent Labs Lab 11/24/16 1652  AMMONIA 11    CBC:  Recent Labs Lab 11/19/16 1520 11/24/16 1652 11/25/16 0623  WBC 5.4 5.0 3.9  NEUTROABS 4.7  --   --   HGB 14.0 15.0 13.0  HCT 39.5 43.6 37.3  MCV 96.2 97.6 97.4  PLT 120* 148* 134*    Cardiac Enzymes: No results for input(s): CKTOTAL, CKMB, CKMBINDEX, TROPONINI in the last 168 hours.  BNP: Invalid  input(s): POCBNP  CBG: No results for input(s): GLUCAP in the last 168 hours.  Microbiology: Results for orders placed or performed in visit on 11/19/16  Urine Culture     Status: Abnormal   Collection Time: 11/19/16  3:15 PM  Result Value Ref  Range Status   Specimen Description URINE, CLEAN CATCH  Final   Special Requests NONE  Final   Culture 60,000 COLONIES/mL ESCHERICHIA COLI (A)  Final   Report Status 11/21/2016 FINAL  Final   Organism ID, Bacteria ESCHERICHIA COLI (A)  Final      Susceptibility   Escherichia coli - MIC*    AMPICILLIN 8 SENSITIVE Sensitive     CEFAZOLIN <=4 SENSITIVE Sensitive     CEFTRIAXONE <=1 SENSITIVE Sensitive     CIPROFLOXACIN <=0.25 SENSITIVE Sensitive     GENTAMICIN <=1 SENSITIVE Sensitive     IMIPENEM <=0.25 SENSITIVE Sensitive     NITROFURANTOIN <=16 SENSITIVE Sensitive     TRIMETH/SULFA >=320 RESISTANT Resistant     AMPICILLIN/SULBACTAM 8 SENSITIVE Sensitive     PIP/TAZO <=4 SENSITIVE Sensitive     Extended ESBL NEGATIVE Sensitive     * 60,000 COLONIES/mL ESCHERICHIA COLI    Coagulation Studies: No results for input(s): LABPROT, INR in the last 72 hours.  Urinalysis:  Recent Labs Lab 11/19/16 1515 11/24/16 2255  COLORURINE YELLOW YELLOW*  LABSPEC 1.025 >1.046*  PHURINE 6.0 5.0  GLUCOSEU NEGATIVE NEGATIVE  HGBUR MODERATE* NEGATIVE  BILIRUBINUR SMALL* MODERATE*  KETONESUR NEGATIVE 5*  PROTEINUR 30* 100*  NITRITE POSITIVE* NEGATIVE  LEUKOCYTESUR LARGE* NEGATIVE    Lipid Panel:     Component Value Date/Time   CHOL 178 03/01/2015 0959   TRIG 131 03/01/2015 0959   HDL 46 03/01/2015 0959   CHOLHDL 3.9 03/01/2015 0959   LDLCALC 106 (H) 03/01/2015 0959    HgbA1C: No results found for: HGBA1C  Urine Drug Screen:     Component Value Date/Time   LABOPIA NONE DETECTED 11/25/2016 1822   COCAINSCRNUR NONE DETECTED 11/25/2016 1822   LABBENZ NONE DETECTED 11/25/2016 1822   AMPHETMU NONE DETECTED 11/25/2016 1822   THCU NONE DETECTED  11/25/2016 1822   LABBARB NONE DETECTED 11/25/2016 1822    Alcohol Level: No results for input(s): ETH in the last 168 hours.   Imaging: Dg Abd 1 View  Result Date: 11/24/2016 CLINICAL DATA:  61 year old female with constipation. EXAM: ABDOMEN - 1 VIEW COMPARISON:  None. FINDINGS: There is moderate amount of stool throughout the colon. No bowel dilatation or evidence of obstruction. No free air or radiopaque calculi. Bilateral tubal ligation clips noted within the pelvis. The osseous structures and the soft tissues appear unremarkable. IMPRESSION: Constipation.  No radiographic evidence of bowel obstruction. Electronically Signed   By: Anner Crete M.D.   On: 11/24/2016 19:56   US Abdomen Limited Ruq  Result Date: 11/26/2016 CLINICAL DATA:  Elevated bilirubin EXAM: ULTRASOUND ABDOMEN LIMITED RIGHT UPPER QUADRANT COMPARISON:  08/22/2012 FINDINGS: Gallbladder: No gallstones or wall thickening visualized. No sonographic Murphy sign noted by sonographer. Common bile duct: Diameter: 4 mm.  Where visualized, no filling defect. Liver: No focal lesion identified. Echogenic with some sparing seen about the gallbladder fossa. No evidence of intrahepatic duct dilatation. Portal vein is patent on color Doppler imaging with normal direction of blood flow towards the liver. IMPRESSION: 1. Negative gallbladder.  No bile duct dilatation. 2. Hepatic steatosis. Electronically Signed   By: Monte Fantasia M.D.   On: 11/26/2016 08:50     Assessment/Plan: 61 year old female with GBS s/p resection undergoing chemo with AMS.  Recent imaging in September of this year showed progression of her GBM.  Suspect worsening mental status related to same.  Patient responding and following commands therefore despite there being an interruption in anticonvulsant therapy do not  suspect altered mental status being related to seizure activity.    Recommendations: 1. MRI of the brain with and without contrast with comparison to  old films.  If patient unable to cooperate for MR imaging would recommended head CT with and without contrast 2.  B1, B12, TSH  Alexis Goodell, MD Neurology [phone] 11/26/2016, 12:22 PM

## 2016-11-26 NOTE — NC FL2 (Signed)
[place] LEVEL OF CARE SCREENING TOOL     IDENTIFICATION  Patient Name: Jessica [name] Birthdate: May 01, 1955 Sex: female Admission Date (Current Location): 11/24/2016  [facility] and [place] Number:  Engineering geologist and Address:  [facility], [address], [place], [place] [address]      Provider Number: [id number]  Attending Physician Name and Address:  Henreitta Leber, MD  Relative Name and Phone Number:       Current Level of Care: Hospital Recommended Level of Care: [place] Prior Approval Number:    Date Approved/Denied:   PASRR Number:    Discharge Plan: SNF    Current Diagnoses: Patient Active Problem List   Diagnosis Date Noted  . Altered mental status 11/24/2016  . Indirect hyperbilirubinemia 11/22/2016  . Encounter for antineoplastic immunotherapy 10/30/2016  . Goals of care, counseling/discussion 09/20/2016  . Thrush 09/13/2016  . Lymphopenia 09/13/2016  . Elevated bilirubin 09/13/2016  . 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 Decubitus ulcer 08/16/2016  . Glioblastoma ([place]) 07/14/2016  . Anxiety 02/21/2015  . Depression 02/21/2015  . 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 Vitamin D deficiency 10/15/2008  . Allergic rhinitis 03/19/2007  . Esophageal reflux 03/19/2007  . Family history of breast cancer 03/19/2007  . Hypercholesteremia 03/19/2007  . Insomnia 03/19/2007  . Menopausal symptom 03/19/2007    Orientation RESPIRATION BLADDER Height & Weight     Self  Normal Incontinent Weight: 161 lb (73 kg) Height:  5\' 4"  (162.6 cm)  BEHAVIORAL SYMPTOMS/MOOD NEUROLOGICAL BOWEL NUTRITION STATUS      Continent Diet (Diet: DYS 1 )  AMBULATORY STATUS COMMUNICATION OF NEEDS Skin   Extensive Assist Verbally Normal                       Personal Care Assistance Level of Assistance  Bathing, Feeding, Dressing Bathing Assistance: Limited assistance Feeding assistance:  Independent Dressing Assistance: Limited assistance     Functional Limitations Info  Sight, Hearing, Speech Sight Info: Impaired Hearing Info: Adequate Speech Info: Adequate    SPECIAL CARE FACTORS FREQUENCY  PT (By licensed PT), OT (By licensed OT)     PT Frequency:  (5) OT Frequency:  (5)            Contractures      Additional Factors Info  Code Status, Allergies Code Status Info:  (Full Code. ) Allergies Info:  (Compazine  Prochlorperazine, Augmentin Amoxicillin-pot Clavulanate, Oxycodone)           Current Medications (11/26/2016):  This is the current hospital active medication list Current Facility-Administered Medications  Medication Dose Route Frequency Provider Last Rate Last Dose  . 0.9 %  sodium chloride infusion   Intravenous Continuous Henreitta Leber, MD 75 mL/hr at 11/26/16 0617    . acetaminophen (TYLENOL) tablet 650 mg  650 mg Oral Q6H PRN Henreitta Leber, MD       Or  . acetaminophen (TYLENOL) suppository 650 mg  650 mg Rectal Q6H PRN Henreitta Leber, MD      . buPROPion (WELLBUTRIN XL) 24 hr tablet 300 mg  300 mg Oral Daily Henreitta Leber, MD   300 mg at 11/26/16 1129  . busPIRone (BUSPAR) tablet 5 mg  5 mg Oral TID Henreitta Leber, MD   5 mg at 11/26/16 1126  . cefTRIAXone (ROCEPHIN) 1 g in dextrose 5 % 50 mL IVPB  1 g Intravenous Q24H Lenis Noon, [facility]  Stopped at 11/25/16 1823  . darifenacin (ENABLEX) 24 hr tablet 7.5 mg  7.5 mg Oral Daily Henreitta Leber, MD   7.5 mg at 11/26/16 1128  . enoxaparin (LOVENOX) injection 40 mg  40 mg Subcutaneous Q24H Henreitta Leber, MD   40 mg at 11/25/16 2244  . fluticasone (FLONASE) 50 MCG/ACT nasal spray 2 spray  2 spray Each Nare Daily Henreitta Leber, MD   2 spray at 11/26/16 1137  . montelukast (SINGULAIR) tablet 10 mg  10 mg Oral QHS Henreitta Leber, MD   10 mg at 11/25/16 2244  . nystatin (MYCOSTATIN/NYSTOP) topical powder   Topical QID Henreitta Leber, MD      . ondansetron [facility]) tablet 4  mg  4 mg Oral Q6H PRN Henreitta Leber, MD       Or  . ondansetron (ZOFRAN) injection 4 mg  4 mg Intravenous Q6H PRN Henreitta Leber, MD      . pantoprazole (PROTONIX) EC tablet 40 mg  40 mg Oral Daily Henreitta Leber, MD   40 mg at 11/26/16 1125  . potassium chloride SA (K-DUR,KLOR-CON) CR tablet 20 mEq  20 mEq Oral Daily Henreitta Leber, MD   20 mEq at 11/26/16 1141  . risperiDONE (RISPERDAL) tablet 0.5 mg  0.5 mg Oral QHS Henreitta Leber, MD   0.5 mg at 11/25/16 2244  . sertraline (ZOLOFT) tablet 25 mg  25 mg Oral Daily Henreitta Leber, MD   25 mg at 11/26/16 1126  . traZODone (DESYREL) tablet 50-100 mg  50-100 mg Oral QHS Henreitta Leber, MD   50 mg at 11/25/16 2244  . zolpidem (AMBIEN) tablet 5 mg  5 mg Oral QHS PRN Henreitta Leber, MD         Discharge Medications: Please see discharge summary for a list of discharge medications.  Relevant Imaging Results:  Relevant Lab Results:   Additional Information  (SSN: [id number])  Draysen Weygandt, Veronia Beets, LCSW

## 2016-11-26 NOTE — Progress Notes (Signed)
Initial Nutrition Assessment  DOCUMENTATION CODES:   Severe malnutrition in context of chronic illness  INTERVENTION:  Provide Premier Protein po BID, each supplement provides 160 kcal and 30 grams of protein.  Provide Magic cup TID with meals, each supplement provides 290 kcal and 9 grams of protein.  Encouraged adequate intake of calories and protein through meals and oral nutrition supplements.  Patient would benefit from being followed by outpatient RD at cancer center.  NUTRITION DIAGNOSIS:   Malnutrition (Severe) related to chronic illness (parieto-occipital glioblastoma s/p resection and XRT, on chemotherapy) as evidenced by 11.3% weight loss over 5 months, energy intake < or equal to 75% for > or equal to 1 month.  GOAL:   Patient will meet greater than or equal to 90% of their needs  MONITOR:   PO intake, Supplement acceptance, Labs, Weight trends, I & O's  REASON FOR ASSESSMENT:   Malnutrition Screening Tool    ASSESSMENT:   61 year old female with PMHx of parieto-occipital glioblastoma s/p resection 07/02/2016 at [facility] s/p cranial XRT (08/06/2016-09/25/2016) on chemotherapy (s/p 2 cycles of Avastin, now on Temodar) who presented with AMS of unclear etiology at this time undergoing workup.   -Following SLP evaluation today patient was put on dysphagia 1 diet with thin liquids.  Met with patient and husband at bedside. Patient is disoriented and unable to provide any history. She is laying on her side in bed with her eyes closed, shaking. Husband reports patient has had a decreased appetite for the past week, which he believes is related to constipation as she has not had a BM in a while. He reports that before that she has not had a decreased appetite. However, when asked how she typically eats he reports she is mainly eating just bites of grapes and watermelon during the day and may have some hamburger or hotdogs for dinner at night as he is now in charge of cooking. That  is not nearly a sufficient enough intake. He reports they have tried Ensure and Boost before, but patient does not like them. Noted an Ensure bottle on table from earlier today (as it was no longer cold) that only had a few sips taken. He is willing to try some Automotive engineer Cup to see if patient will like them.  Husband reports patient has been "fairly weight stable" and has not lost any weight. However, he was unsure of her UBW. Per review of chart patient has had weight loss. She was 186.3 lbs on 07/02/2016 and was 165.2 lbs on admission. That is a weight loss of 21.1 lbs (11.3% body weight) over 5 months, which is significant for time frame.  Meal Completion: variable - 0-95%  Medications reviewed and include: pantoprazole, potassium chloride 20 mEq daily, NS @ 75 ml/hr, ceftriaxone.  Labs reviewed: Potassium 3.2.  Nutrition-Focused physical exam completed. Findings are no overt fat depletion, mild-moderate muscle depletion (mild depletion in patellar and anterior thigh region; moderate depletion in posterior calf region), and generalized non-pitting edema. Difficult to truly assess muscle and fat status well in setting of patient's generalized non-pitting edema.  Diet Order:  DIET - DYS 1 Room service appropriate? Yes with Assist; Fluid consistency: Thin  Skin:  Wound (see comment) (MSAD to breast and perineum)  Last BM:  Unknown  Height:   Ht Readings from Last 1 Encounters:  11/26/16 5' 4"  (1.626 m)    Weight:   Wt Readings from Last 1 Encounters:  11/26/16 161 lb (73  kg)    Ideal Body Weight:  54.5 kg  BMI:  Body mass index is 27.64 kg/m.  Estimated Nutritional Needs:   Kcal:  1690-1950 (MSJ x 1.3-1.5)  Protein:  90-105 grams (1.2-1.4 grams/kg)  Fluid:  2.2 L/day (30 ml/kg)  EDUCATION NEEDS:   Education needs addressed  Willey Blade, [place], [place], [place] Office: [phone] Pager: [phone] After Hours/Weekend Pager: [phone]

## 2016-11-26 NOTE — Clinical Social Work Note (Addendum)
Clinical Social Work Assessment  Patient Details  Name: Jessica [name] MRN: [id number] Date of Birth: 09-30-1955  Date of referral:  11/26/16               Reason for consult:  Facility Placement, Discharge Planning                Permission sought to share information with:  Chartered certified accountant granted to share information::  Yes, Verbal Permission Granted  Name::      [place]::   [place]  Relationship::     Contact Information:     Housing/Transportation Living arrangements for the past 2 months:  [place] of Information:  Parent, Spouse Patient Interpreter Needed:  None Criminal Activity/Legal Involvement Pertinent to Current Situation/Hospitalization:  No - Comment as needed Significant Relationships:  Parents, Spouse Lives with:  Spouse Do you feel safe going back to the place where you live?  Yes Need for family participation in patient care:  Yes (Comment)  Care giving concerns:  Patient lives in [place] with husband Juanda Crumble.   Social Worker assessment / plan: Holiday representative (CSW) received SNF consult. PT is recommending SNF. Social work Theatre manager met with patient, mother Webb Silversmith [phone]), and aunt Fraser Din at bedside. Social work Theatre manager introduced self and explained the role of the [place]. Patient was alert but not oriented so mother spoke on her behalf. Patient's mother stated that patient lives with her husband Juanda Crumble [phone]) in [place] and he makes the decisions in regards to patients care. Social work Theatre manager explained that PT is recommending SNF and the SNF process. Social work Theatre manager also explained that [employer] requires authorization and that bed offers can be limited because of transportation to treatment center. Patient's mother vebalized understanding. Social work Theatre manager presented bed offers and patients mother prefers [facility]. Patient's mother also shared that patient receives treatment  at [facility] every 2 weeks. Patient's mother also shared that patients husband transports patient via wheelchair. CSW contacted patient's husband to confirm above and that he was agreeable with SNF search. Patient's husband is agreeable and accepted bed offer at [facility]. Per husband patient's chemo drug temodar is taken every 28 days and the cancer center orders it a ships it to patient's house. Per husband patient has received her temodar for the month of October. Per husband if needed he will provide temodar from home.   Broadus John, [facility] liaison is aware of accepted bed offer and stated [facility] SNF authorization today. CSW and social work Theatre manager will continue to follow up and assist.  Employment status:  Disabled (Comment on whether or not currently receiving Disability) Insurance information:  Managed Care PT Recommendations:  [place] / Referral to community resources:  [place]  Patient/Family's Response to care:  Patient's mother and husband prefer [facility] for patient to complete rehab.  Patient/Family's Understanding of and Emotional Response to Diagnosis, Current Treatment, and Prognosis:  Patient's mother and husband were pleasant and thanked CSW and social work Theatre manager for their assistance.  Emotional Assessment Appearance:  Appears stated age Attitude/Demeanor/Rapport:  Unable to Assess Affect (typically observed):  Unable to Assess Orientation:  Oriented to Self Alcohol / Substance use:  Not Applicable Psych involvement (Current and /or in the community):  No (Comment)  Discharge Needs  Concerns to be addressed:  Care Coordination, Discharge Planning Concerns Readmission within the last 30 days:  No Current discharge risk:  Dependent with  Mobility, Cognitively Impaired Barriers to Discharge:  Continued Medical Work up   Smith Mince, [place] Work 11/26/2016, 2:30 PM

## 2016-11-26 NOTE — Consult Note (Signed)
[facility]  Date of admission:  11/24/2016  Inpatient day:  11/26/2016  Consulting physician: Dr. Demetrios Loll  Reason for Consultation:  History of glioblastoma s/p craniotomy and currently undergoing chemotherapy.  Chief Complaint: Jessica [name] is a 61 y.o. female with glioblastoma multiforme s/p resection, radiation and concurrent Temodar who was admitted from the oncology clinic with ongoing decline in functional status and altered mental status on day 12 s/p cycle #1 monthly Temodar and Avastin.  HPI:  The patient was last seen in the medical oncology clinic by me on 11/19/2016.  At that time, she required assistance with her ADLs. Exam revealed a marked decrease in her overall ability to see on her left side. She had not started her antibiotic for a UTI diagnosed the week prior.  WBC was 5400 with an [place] of 4700.  A new prescription for ciprofloxacin was resent.  Decadron was decreased to 1 mg a day.  She represented to the oncology clinic on 11/24/2016.  She was responding less over the past few days.  She was minimally interactive.  Her husband was having difficulties caring for her.  She was not eating or drinking.  She was not taking her medications regularly.  She had taken some of her ciprofloxacin.  Urine culture during this admission is negative.  No metabolic etiology of altered mental status.  No evidence of seizure.   Past Medical History:  Diagnosis Date  . Asthma    Pt does not use inhaler   . Elevated cholesterol   . Glioblastoma ([place])   . History of chicken pox   . History of measles   . History of mumps     Past Surgical History:  Procedure Laterality Date  . CT Scan of head  2009   negative  . FOOT SURGERY    . LAPAROSCOPY     abdominal endometriosis  . TUBAL LIGATION  1984    Family History  Problem Relation Age of Onset  . Breast cancer Mother 15  . Depression Mother   . Hyperlipidemia Mother   . Hypothyroidism Mother   .  Hypertension Mother   . Diabetes Father   . Hypertension Father   . Hypertension Brother   . Hypertension Sister     Social History:  reports that she has never smoked. She has never used smokeless tobacco. She reports that she drinks alcohol. She reports that she does not use drugs.  The patient is accompanied by her mother.  Allergies:  Allergies  Allergen Reactions  . Compazine  [Prochlorperazine] Anaphylaxis    swelling, SOB.  . Augmentin [Amoxicillin-Pot Clavulanate] Nausea And Vomiting  . Oxycodone Nausea Only    Medications Prior to Admission  Medication Sig Dispense Refill  . atorvastatin (LIPITOR) 40 MG tablet TAKE 1 TABLET DAILY 90 tablet 4  . buPROPion (WELLBUTRIN XL) 300 MG 24 hr tablet Take 1 tablet (300 mg total) by mouth daily. 90 tablet 3  . busPIRone (BUSPAR) 5 MG tablet Take 1 tablet (5 mg total) by mouth 3 (three) times daily. 270 tablet 3  . dexamethasone (DECADRON) 2 MG tablet Take 1 tablet by mouth daily.  0  . divalproex (DEPAKOTE) 500 MG DR tablet Take 587m (one tablet) twice daily through 9/24, then decrease to 5071m(one tablet) nightly 9/25-9/28, then stop.    . montelukast (SINGULAIR) 10 MG tablet Take 10 mg by mouth at bedtime.    . Marland Kitchenystatin (MYCOSTATIN/NYSTOP) powder Apply topically 4 (four) times daily.  Use x 5 days then PRN. 15 g 1  . omeprazole (PRILOSEC) 20 MG capsule Take 1 capsule (20 mg total) by mouth daily. 90 capsule 3  . potassium chloride SA (K-DUR,KLOR-CON) 20 MEQ tablet Take 1 tablet (20 mEq total) by mouth daily. 7 tablet 0  . risperiDONE (RISPERDAL) 0.5 MG tablet Take 0.5 mg by mouth at bedtime.     . sertraline (ZOLOFT) 25 MG tablet Take 25 mg by mouth daily.     . solifenacin (VESICARE) 10 MG tablet Take 10 mg by mouth daily.     Marland Kitchen temozolomide (TEMODAR) 100 MG capsule Take 300 mg a day for 5 days every 28 days.  May take on an empty stomach or at bedtime to decrease nausea & vomiting. 15 capsule 0  . traZODone (DESYREL) 50 MG tablet  Take 50-100 mg by mouth at bedtime.    Marland Kitchen zolpidem (AMBIEN) 5 MG tablet TAKE 1 TABLET BY MOUTH EVERY NIGHT AT BEDTIME AS NEEDED FOR INSOMNIA 30 tablet 5  . acetaminophen (TYLENOL) 325 MG tablet Take 975 mg by mouth every 6 (six) hours as needed.    . ALPRAZolam (XANAX) 1 MG tablet Take 1 tablet (1 mg total) by mouth daily as needed for anxiety. (Patient not taking: Reported on 11/24/2016) 30 tablet 0  . ciprofloxacin (CIPRO) 500 MG tablet Take 1 tablet (500 mg total) by mouth 2 (two) times daily. (Patient not taking: Reported on 11/24/2016) 14 tablet 0  . fluconazole (DIFLUCAN) 200 MG tablet Take 1 tablet (200 mg total) by mouth daily. (Patient not taking: Reported on 11/24/2016) 3 tablet 0  . fluticasone (FLONASE) 50 MCG/ACT nasal spray SHAKE LIQUID AND USE 2 SPRAYS IN EACH NOSTRIL DAILY (Patient not taking: Reported on 11/24/2016) 16 g 5  . HYDROcodone-homatropine (HYCODAN) 5-1.5 MG/5ML syrup 5 ml 4-6 hours as needed for cough (Patient not taking: Reported on 11/24/2016) 240 mL 0  . ondansetron (ZOFRAN-ODT) 8 MG disintegrating tablet Take 1 tablet (8 mg total) by mouth every 8 (eight) hours as needed for nausea or vomiting. (Patient not taking: Reported on 11/24/2016) 30 tablet 1  . polyethylene glycol (MIRALAX / GLYCOLAX) packet Take 17 g by mouth daily as needed.    . sulfamethoxazole-trimethoprim (BACTRIM DS,SEPTRA DS) 800-160 MG tablet Take 1 tablet by mouth 3 (three) times a week. (Patient not taking: Reported on 11/24/2016) 30 tablet 0    Review of Systems: GENERAL:  No complaints.  No fevers or sweats.  PERFORMANCE STATUS (ECOG):  3. HEENT:  Bilateral vision loss.  No sore throat, mouth sores or tenderness. Lungs: No shortness of breath or cough.  No hemoptysis. Cardiac:  No chest pain, palpitations, orthopnea, or PND. GI:  Eating "some".  Constipation.  No nausea, vomiting, diarrhea, melena or hematochezia.  Pain pills cause emesis. GU:  No urgency, frequency, dysuria, or  hematuria. Musculoskeletal:  No back pain.  No joint pain.  No muscle tenderness. Extremities:  Legs are weak.  No pain or swelling. Skin:  No rashes or skin changes. Neuro:  Left homonymous hemianopsia. Steady decline in vision.  Unsteady on feet.  No headache or focal numbness. Endocrine:  No diabetes, thyroid issues, hot flashes or night sweats. Psych:  Anxiety attacks (confused at times). Pain:  No focal pain. Review of systems:  All other systems reviewed and found to be negative.  Physical Exam:  Blood pressure (!) 128/59, pulse 82, temperature 97.8 F (36.6 C), resp. rate 20, height 5' 4"  (1.626 m), weight 161 lb (  73 kg), SpO2 100 %.  GENERAL: Chronically fatigued appearing woman sitting up in bed in no acute distress. MENTAL STATUS:  Alert and oriented to person and place.  States that it is June. HEAD:  Shoulder length light brown hair.  Cushingoid features. Normocephalic, atraumatic, and symmetric.  EYES:  Prefers keeping eyes closed.  Blue eyes.  No conjunctivitis or scleral icterus. ENT:  No oral lesions.  No thrush.  Mucous membranes moist.  RESPIRATORY:  Clear to auscultation without rales, wheezes or rhonchi. CARDIOVASCULAR:  Regular rate and rhythm without murmur, rub or gallop. ABDOMEN:  Soft, non-tender, with active bowel sounds, and no hepatosplenomegaly.  No masses. SKIN:  No rashes. EXTREMITIES: No edema, no skin discoloration or tenderness.  No palpable cords. NEUROLOGICAL: Does not know my name or mother (states that she is someone who works here).  Remembers 3 of 3 words immediately, but 0 of 3 words in 5 minutes (even with prompting).  Does not know Software engineer.  6 quarters in a dollar.  Can count backwards from 10 by 1s, but not 3s.  Face symmetric.  Sensation intact.  Upper extremity strength symmetric.  Can reach out to grap hand.  Left leg stronger than right leg.  No clonus or Babinski. PSYCH:  Engaged.  Talkative.   Results for orders placed or performed during  the hospital encounter of 11/24/16 (from the past 48 hour(s))  HIV antibody (Routine Testing)     Status: None   Collection Time: 11/24/16  4:52 PM  Result Value Ref Range   HIV Screen 4th Generation wRfx Non Reactive Non Reactive    Comment: (NOTE) Performed At: [facility] [place], [place] [id number] Lindon Romp MD ZH:[id number]   CBC     Status: Abnormal   Collection Time: 11/24/16  4:52 PM  Result Value Ref Range   WBC 5.0 3.6 - 11.0 K/uL   RBC 4.47 3.80 - 5.20 MIL/uL   Hemoglobin 15.0 12.0 - 16.0 g/dL   HCT 43.6 35.0 - 47.0 %   MCV 97.6 80.0 - 100.0 fL   MCH 33.6 26.0 - 34.0 pg   MCHC 34.4 32.0 - 36.0 g/dL   RDW 16.1 (H) 11.5 - 14.5 %   Platelets 148 (L) 150 - 440 K/uL  Comprehensive metabolic panel     Status: Abnormal   Collection Time: 11/24/16  4:52 PM  Result Value Ref Range   Sodium 137 135 - 145 mmol/L   Potassium 4.1 3.5 - 5.1 mmol/L   Chloride 100 (L) 101 - 111 mmol/L   CO2 25 22 - 32 mmol/L   Glucose, Bld 145 (H) 65 - 99 mg/dL   BUN 11 6 - 20 mg/dL   Creatinine, Ser 1.00 0.44 - 1.00 mg/dL   Calcium 9.1 8.9 - 10.3 mg/dL   Total Protein 6.2 (L) 6.5 - 8.1 g/dL   Albumin 3.8 3.5 - 5.0 g/dL   AST 21 15 - 41 U/L   ALT 40 14 - 54 U/L   Alkaline Phosphatase 62 38 - 126 U/L   Total Bilirubin 2.0 (H) 0.3 - 1.2 mg/dL   GFR calc non Af Amer 59 (L) >60 mL/min   GFR calc Af Amer >60 >60 mL/min    Comment: (NOTE) The eGFR has been calculated using the CKD EPI equation. This calculation has not been validated in all clinical situations. eGFR's persistently <60 mL/min signify possible Chronic Kidney Disease.    Anion gap 12 5 - 15  Ammonia     Status: None   Collection Time: 11/24/16  4:52 PM  Result Value Ref Range   Ammonia 11 9 - 35 umol/L  Valproic acid level     Status: Abnormal   Collection Time: 11/24/16  4:52 PM  Result Value Ref Range   Valproic Acid Lvl <10 (L) 50.0 - 100.0 ug/mL  Urinalysis, Routine w reflex microscopic      Status: Abnormal   Collection Time: 11/24/16 10:55 PM  Result Value Ref Range   Color, Urine YELLOW (A) YELLOW   APPearance TURBID (A) CLEAR   Specific Gravity, Urine >1.046 (H) 1.005 - 1.030   pH 5.0 5.0 - 8.0   Glucose, UA NEGATIVE NEGATIVE mg/dL   Hgb urine dipstick NEGATIVE NEGATIVE   Bilirubin Urine MODERATE (A) NEGATIVE   Ketones, ur 5 (A) NEGATIVE mg/dL   Protein, ur 100 (A) NEGATIVE mg/dL   Nitrite NEGATIVE NEGATIVE   Leukocytes, UA NEGATIVE NEGATIVE   RBC / HPF NONE SEEN 0 - 5 RBC/hpf   WBC, UA NONE SEEN 0 - 5 WBC/hpf   Bacteria, UA NONE SEEN NONE SEEN   Squamous Epithelial / LPF TOO NUMEROUS TO COUNT (A) NONE SEEN   Amorphous Crystal PRESENT   Basic metabolic panel     Status: Abnormal   Collection Time: 11/25/16  6:23 AM  Result Value Ref Range   Sodium 138 135 - 145 mmol/L   Potassium 3.2 (L) 3.5 - 5.1 mmol/L   Chloride 108 101 - 111 mmol/L   CO2 24 22 - 32 mmol/L   Glucose, Bld 84 65 - 99 mg/dL   BUN 10 6 - 20 mg/dL   Creatinine, Ser 0.77 0.44 - 1.00 mg/dL   Calcium 8.0 (L) 8.9 - 10.3 mg/dL   GFR calc non Af Amer >60 >60 mL/min   GFR calc Af Amer >60 >60 mL/min    Comment: (NOTE) The eGFR has been calculated using the CKD EPI equation. This calculation has not been validated in all clinical situations. eGFR's persistently <60 mL/min signify possible Chronic Kidney Disease.    Anion gap 6 5 - 15  CBC     Status: Abnormal   Collection Time: 11/25/16  6:23 AM  Result Value Ref Range   WBC 3.9 3.6 - 11.0 K/uL   RBC 3.83 3.80 - 5.20 MIL/uL   Hemoglobin 13.0 12.0 - 16.0 g/dL   HCT 37.3 35.0 - 47.0 %   MCV 97.4 80.0 - 100.0 fL   MCH 34.0 26.0 - 34.0 pg   MCHC 34.9 32.0 - 36.0 g/dL   RDW 16.1 (H) 11.5 - 14.5 %   Platelets 134 (L) 150 - 440 K/uL  Magnesium     Status: None   Collection Time: 11/25/16  6:23 AM  Result Value Ref Range   Magnesium 2.1 1.7 - 2.4 mg/dL  Urine Drug Screen, Qualitative ([facility] only)     Status: None   Collection Time: 11/25/16  6:22  PM  Result Value Ref Range   Tricyclic, Ur Screen NONE DETECTED NONE DETECTED   Amphetamines, Ur Screen NONE DETECTED NONE DETECTED   MDMA (Ecstasy)Ur Screen NONE DETECTED NONE DETECTED   Cocaine Metabolite,Ur [place] NONE DETECTED NONE DETECTED   Opiate, Ur Screen NONE DETECTED NONE DETECTED   Phencyclidine (PCP) Ur S NONE DETECTED NONE DETECTED   Cannabinoid 50 Ng, Ur [place] NONE DETECTED NONE DETECTED   Barbiturates, Ur Screen NONE DETECTED NONE DETECTED   Benzodiazepine, Ur Scrn NONE DETECTED NONE  DETECTED   Methadone Scn, Ur NONE DETECTED NONE DETECTED    Comment: (NOTE) 419  Tricyclics, urine               Cutoff 1000 ng/mL 200  Amphetamines, urine             Cutoff 1000 ng/mL 300  MDMA (Ecstasy), urine           Cutoff 500 ng/mL 400  Cocaine Metabolite, urine       Cutoff 300 ng/mL 500  Opiate, urine                   Cutoff 300 ng/mL 600  Phencyclidine (PCP), urine      Cutoff 25 ng/mL 700  Cannabinoid, urine              Cutoff 50 ng/mL 800  Barbiturates, urine             Cutoff 200 ng/mL 900  Benzodiazepine, urine           Cutoff 200 ng/mL 1000 Methadone, urine                Cutoff 300 ng/mL 1100 1200 The urine drug screen provides only a preliminary, unconfirmed 1300 analytical test result and should not be used for non-medical 1400 purposes. Clinical consideration and professional judgment should 1500 be applied to any positive drug screen result due to possible 1600 interfering substances. A more specific alternate chemical method 1700 must be used in order to obtain a confirmed analytical result.  1800 Gas chromato graphy / mass spectrometry (GC/MS) is the preferred 1900 confirmatory method.    Dg Abd 1 View  Result Date: 11/24/2016 CLINICAL DATA:  61 year old female with constipation. EXAM: ABDOMEN - 1 VIEW COMPARISON:  None. FINDINGS: There is moderate amount of stool throughout the colon. No bowel dilatation or evidence of obstruction. No free air or radiopaque  calculi. Bilateral tubal ligation clips noted within the pelvis. The osseous structures and the soft tissues appear unremarkable. IMPRESSION: Constipation.  No radiographic evidence of bowel obstruction. Electronically Signed   By: Anner Crete M.D.   On: 11/24/2016 19:56   US Abdomen Limited Ruq  Result Date: 11/26/2016 CLINICAL DATA:  Elevated bilirubin EXAM: ULTRASOUND ABDOMEN LIMITED RIGHT UPPER QUADRANT COMPARISON:  08/22/2012 FINDINGS: Gallbladder: No gallstones or wall thickening visualized. No sonographic Murphy sign noted by sonographer. Common bile duct: Diameter: 4 mm.  Where visualized, no filling defect. Liver: No focal lesion identified. Echogenic with some sparing seen about the gallbladder fossa. No evidence of intrahepatic duct dilatation. Portal vein is patent on color Doppler imaging with normal direction of blood flow towards the liver. IMPRESSION: 1. Negative gallbladder.  No bile duct dilatation. 2. Hepatic steatosis. Electronically Signed   By: Monte Fantasia M.D.   On: 11/26/2016 08:50    Assessment:  The patient is a 61 y.o.  woman with glioblastoma multiforme who was admitted through the oncology clinic with altered mental status.  She is s/p resection, concurrent Temodar and radiation, and day 14  s/p cycle #1 monthly Temodar and Avastin.  She has a recent UTI s/p ciprofloxacin.  Repeat urine culture is negative.  Symptomatically, she has had a general decline in health.  Mental status has declined.  She is unable to care for her ADLs.  Plan:   1.  Oncology:  Concern for progressive glioblastoma.  Head MRI with and without contrast to assess disease status.  Neurology consult.  Will restart Decadron  at dosing from 2 weeks ago.  Patient on Bactrim prophylaxis 3x/week for PCP prophylaxis.  Prognosis poor.  Agree with palliative care consult.  Will need to meet with patient's husband.  2.  Gastroenterology:  Patient with mildy elevated bilirubin (2.0).  Fractionation in  clinic was predominantly indirect (direct fraction 0.4).  RUQ ultrasound negative.  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  3.  Disposition:  Patient's husband unable to care for her.  Suspect she will need skilled nursing at discharge.   Thank you for allowing me to participate in ASHLY YEPEZ 's care.  I will follow her closely with you while hospitalized and after discharge in the outpatient department.   Lequita Asal, MD  11/26/2016, 2:17 PM

## 2016-11-26 NOTE — Progress Notes (Signed)
[facility] [place] NAME: Jessica [name]    MR#:  [id number]  DATE OF BIRTH:  02/05/1955  SUBJECTIVE:   Pt. Here due to AMS/confusion.  Mental status slightly improved since admission. Pt's Mother is at bedside.    REVIEW OF SYSTEMS:    Review of Systems  Constitutional: Negative for chills and fever.  HENT: Negative for congestion and tinnitus.   Eyes: Negative for blurred vision and double vision.  Respiratory: Negative for cough, shortness of breath and wheezing.   Cardiovascular: Negative for chest pain, orthopnea and PND.  Gastrointestinal: Negative for abdominal pain, diarrhea, nausea and vomiting.  Genitourinary: Negative for dysuria and hematuria.  Neurological: Negative for dizziness, sensory change and focal weakness.  All other systems reviewed and are negative.   Nutrition: Dsyphagia I diet Tolerating Diet: Yes Tolerating PT: Eval noted.   DRUG ALLERGIES:   Allergies  Allergen Reactions  . Compazine  [Prochlorperazine] Anaphylaxis    swelling, SOB.  . Augmentin [Amoxicillin-Pot Clavulanate] Nausea And Vomiting  . Oxycodone Nausea Only    VITALS:  Blood pressure (!) 128/59, pulse 82, temperature 97.8 F (36.6 C), resp. rate 20, height 5\' 4"  (1.626 m), weight 73 kg (161 lb), SpO2 100 %.  PHYSICAL EXAMINATION:   Physical Exam  GENERAL:  61 y.o.-year-old patient lying in bed follows simple commands but remains lethargic.  EYES: Pupils equal, round, reactive to light and accommodation. No scleral icterus. Extraocular muscles intact.  HEENT: Head atraumatic, normocephalic. Oropharynx and nasopharynx clear.  NECK:  Supple, no jugular venous distention. No thyroid enlargement, no tenderness.  LUNGS: Normal breath sounds bilaterally, no wheezing, rales, rhonchi. No use of accessory muscles of respiration.  CARDIOVASCULAR: S1, S2 normal. No murmurs, rubs, or gallops.  ABDOMEN: Soft, nontender, nondistended. Bowel sounds  present. No organomegaly or mass.  EXTREMITIES: No cyanosis, clubbing or edema b/l.    NEUROLOGIC: Cranial nerves II through XII are intact. No focal Motor or sensory deficits b/l.  Globally weak.  PSYCHIATRIC: The patient is alert and oriented x 3.  SKIN: No obvious rash, lesion, or ulcer.    LABORATORY PANEL:   CBC  Recent Labs Lab 11/25/16 0623  WBC 3.9  HGB 13.0  HCT 37.3  PLT 134*   ------------------------------------------------------------------------------------------------------------------  Chemistries   Recent Labs Lab 11/24/16 1652 11/25/16 0623  NA 137 138  K 4.1 3.2*  CL 100* 108  CO2 25 24  GLUCOSE 145* 84  BUN 11 10  CREATININE 1.00 0.77  CALCIUM 9.1 8.0*  MG  --  2.1  AST 21  --   ALT 40  --   ALKPHOS 62  --   BILITOT 2.0*  --    ------------------------------------------------------------------------------------------------------------------  Cardiac Enzymes No results for input(s): TROPONINI in the last 168 hours. ------------------------------------------------------------------------------------------------------------------  RADIOLOGY:  Dg Abd 1 View  Result Date: 11/24/2016 CLINICAL DATA:  62 year old female with constipation. EXAM: ABDOMEN - 1 VIEW COMPARISON:  None. FINDINGS: There is moderate amount of stool throughout the colon. No bowel dilatation or evidence of obstruction. No free air or radiopaque calculi. Bilateral tubal ligation clips noted within the pelvis. The osseous structures and the soft tissues appear unremarkable. IMPRESSION: Constipation.  No radiographic evidence of bowel obstruction. Electronically Signed   By: Anner Crete M.D.   On: 11/24/2016 19:56   US Abdomen Limited Ruq  Result Date: 11/26/2016 CLINICAL DATA:  Elevated bilirubin EXAM: ULTRASOUND ABDOMEN LIMITED RIGHT UPPER QUADRANT COMPARISON:  08/22/2012 FINDINGS: Gallbladder: No  gallstones or wall thickening visualized. No sonographic Murphy sign noted  by sonographer. Common bile duct: Diameter: 4 mm.  Where visualized, no filling defect. Liver: No focal lesion identified. Echogenic with some sparing seen about the gallbladder fossa. No evidence of intrahepatic duct dilatation. Portal vein is patent on color Doppler imaging with normal direction of blood flow towards the liver. IMPRESSION: 1. Negative gallbladder.  No bile duct dilatation. 2. Hepatic steatosis. Electronically Signed   By: Monte Fantasia M.D.   On: 11/26/2016 08:50     ASSESSMENT AND PLAN:   61 year old female with past medical history of glioblastoma status post craniotomy with ongoing chemotherapy and radiation, anxiety/depression, hyperlipidemia, GERD, recent urinary tract infection who presents to the hospital due to altered mental status.  1. Altered mental status - etiology unclear presently. Initially thought to be metabolic in nature but workup so far has been negative. Patient's urinalysis negative for UTI, urine drug screen is negative, ammonia level is normal. -This could just be related to patient's progressive malignancy with glioblastoma. -Neurology consulted today and will get repeat imaging of the head to see the progression of her tumor. Patient may possibly benefit from palliative care but I will defer this to oncology.  2. Urinary tract infection-this has now been ruled out. Patient has received 3 doses of IV ceftriaxone.  3. History of anxiety/depression-continue Xanax, BuSpar, Zoloft, Risperdal. - Depakote level normal.    4. Hyperlipidemia-continue atorvastatin.  5. GERD-continue Protonix.  6. History of glioblastoma status post craniotomy with currently undergoing chemotherapy-patient is followed by Dr. Mike Gip. - seen by Oncology and plan for repeat Imaging to see the progression of the disease.  - ?? Palliative care consult.   7. Urinary incontinence-continue Vesicare.  8. Elevated Bili - no abdominal pain. Chronic and abdominal US  showing no acute pathology.   9. Hypokalemia/hyopmagnesemia - improving with supplementation and will cont. To monitor.     All the records are reviewed and case discussed with Care Management/Social Worker. Management plans discussed with the patient, family and they are in agreement.  CODE STATUS: Full code  DVT Prophylaxis: Lovenox  TOTAL TIME TAKING CARE OF THIS PATIENT: 30 minutes.   POSSIBLE D/C IN 2-3 DAYS, DEPENDING ON CLINICAL CONDITION.   Henreitta Leber M.D on 11/26/2016 at 2:40 PM  Between 7am to 6pm - Pager - [phone]  After 6pm go to [email or web address] - [facility]  Office  [phone]  CC: Primary care physician; Kirk Ruths, MD

## 2016-11-27 ENCOUNTER — Inpatient Hospital Stay: Payer: BLUE CROSS/BLUE SHIELD

## 2016-11-27 ENCOUNTER — Inpatient Hospital Stay: Payer: BLUE CROSS/BLUE SHIELD | Admitting: Hematology and Oncology

## 2016-11-27 DIAGNOSIS — Z792 Long term (current) use of antibiotics: Secondary | ICD-10-CM | POA: Diagnosis not present

## 2016-11-27 DIAGNOSIS — G9340 Encephalopathy, unspecified: Secondary | ICD-10-CM | POA: Diagnosis not present

## 2016-11-27 DIAGNOSIS — N390 Urinary tract infection, site not specified: Secondary | ICD-10-CM | POA: Diagnosis not present

## 2016-11-27 DIAGNOSIS — C719 Malignant neoplasm of brain, unspecified: Secondary | ICD-10-CM

## 2016-11-27 DIAGNOSIS — R4182 Altered mental status, unspecified: Secondary | ICD-10-CM | POA: Diagnosis not present

## 2016-11-27 LAB — POTASSIUM: Potassium: 3.1 mmol/L — ABNORMAL LOW (ref 3.5–5.1)

## 2016-11-27 LAB — MAGNESIUM: MAGNESIUM: 2 mg/dL (ref 1.7–2.4)

## 2016-11-27 MED ORDER — POTASSIUM CHLORIDE CRYS ER 10 MEQ PO TBCR
20.0000 meq | EXTENDED_RELEASE_TABLET | Freq: Once | ORAL | Status: AC
  Administered 2016-11-27: 20 meq via ORAL
  Filled 2016-11-27: qty 2

## 2016-11-27 MED ORDER — SULFAMETHOXAZOLE-TRIMETHOPRIM 800-160 MG PO TABS
1.0000 | ORAL_TABLET | ORAL | Status: DC
  Administered 2016-11-27: 1 via ORAL
  Filled 2016-11-27: qty 1

## 2016-11-27 MED ORDER — DEXAMETHASONE 4 MG PO TABS
2.0000 mg | ORAL_TABLET | Freq: Every day | ORAL | Status: DC
  Administered 2016-11-27 – 2016-11-28 (×2): 2 mg via ORAL
  Filled 2016-11-27 (×2): qty 1

## 2016-11-27 NOTE — Progress Notes (Signed)
SLP Cancellation Note  Patient Details Name: Jessica [name] MRN: [id number] DOB: 1955-09-09   Cancelled treatment:       Reason Eval/Treat Not Completed: Medical issues which prohibited therapy;Fatigue/lethargy limiting ability to participate (chart reviewed; consulted NSG re: pt's status today). Pt is unable to attend to follow through w/ tasks or participate in tx/po trials today, meal(modified diet) w/ NSG. NSG described significant difficulty attempting to give po Pills crushed in Puree this morning; Palliative care and Oncology meeting w/ family this afternoon.  Discussed w/ NSG to hold any po's if it appears it is a struggle for pt to attend and follow through and/or swallow and clear po's. NSG suspects pt's mental status is declining d/t the tumor. Recommend oral care for hygiene and stimulation as able. Recommend strict aspiration precautions w/ any attempts at oral intake. NSG will update MD re: pt's oral intake decline as well. Palliative Care updated.  ST services available for any further education as needed.     Orinda Kenner, [place], CCC-SLP Watson,Katherine 11/27/2016, 3:22 PM

## 2016-11-27 NOTE — Consult Note (Signed)
Minimal improvement overnight and still very sleepy.   Past Medical History:  Diagnosis Date  . Asthma    Pt does not use inhaler   . Elevated cholesterol   . Glioblastoma ([place])   . History of chicken pox   . History of measles   . History of mumps     Past Surgical History:  Procedure Laterality Date  . CT Scan of head  2009   negative  . FOOT SURGERY    . LAPAROSCOPY     abdominal endometriosis  . TUBAL LIGATION  1984    Family History  Problem Relation Age of Onset  . Breast cancer Mother 100  . Depression Mother   . Hyperlipidemia Mother   . Hypothyroidism Mother   . Hypertension Mother   . Diabetes Father   . Hypertension Father   . Hypertension Brother   . Hypertension Sister     Social History:  reports that she has never smoked. She has never used smokeless tobacco. She reports that she drinks alcohol. She reports that she does not use drugs.  Allergies  Allergen Reactions  . Compazine  [Prochlorperazine] Anaphylaxis    swelling, SOB.  . Augmentin [Amoxicillin-Pot Clavulanate] Nausea And Vomiting  . Oxycodone Nausea Only    Medications:  I have reviewed the patient's current medications. Prior to Admission:  Prescriptions Prior to Admission  Medication Sig Dispense Refill Last Dose  . atorvastatin (LIPITOR) 40 MG tablet TAKE 1 TABLET DAILY 90 tablet 4 11/23/2016 at Unknown time  . buPROPion (WELLBUTRIN XL) 300 MG 24 hr tablet Take 1 tablet (300 mg total) by mouth daily. 90 tablet 3 11/23/2016 at Unknown time  . busPIRone (BUSPAR) 5 MG tablet Take 1 tablet (5 mg total) by mouth 3 (three) times daily. 270 tablet 3 11/23/2016 at Unknown time  . dexamethasone (DECADRON) 2 MG tablet Take 1 tablet by mouth daily.  0 11/23/2016 at Unknown time  . divalproex (DEPAKOTE) 500 MG DR tablet Take 500mg  (one tablet) twice daily through 9/24, then decrease to 500mg  (one tablet) nightly 9/25-9/28, then stop.   11/23/2016 at Unknown time  . montelukast (SINGULAIR) 10 MG  tablet Take 10 mg by mouth at bedtime.   11/23/2016 at Unknown time  . nystatin (MYCOSTATIN/NYSTOP) powder Apply topically 4 (four) times daily. Use x 5 days then PRN. 15 g 1 11/23/2016 at Unknown time  . omeprazole (PRILOSEC) 20 MG capsule Take 1 capsule (20 mg total) by mouth daily. 90 capsule 3 11/23/2016 at Unknown time  . potassium chloride SA (K-DUR,KLOR-CON) 20 MEQ tablet Take 1 tablet (20 mEq total) by mouth daily. 7 tablet 0 11/23/2016 at Unknown time  . risperiDONE (RISPERDAL) 0.5 MG tablet Take 0.5 mg by mouth at bedtime.    11/23/2016 at Unknown time  . sertraline (ZOLOFT) 25 MG tablet Take 25 mg by mouth daily.    11/23/2016 at Unknown time  . solifenacin (VESICARE) 10 MG tablet Take 10 mg by mouth daily.    11/23/2016 at Unknown time  . temozolomide (TEMODAR) 100 MG capsule Take 300 mg a day for 5 days every 28 days.  May take on an empty stomach or at bedtime to decrease nausea & vomiting. 15 capsule 0 Past Month at Unknown time  . traZODone (DESYREL) 50 MG tablet Take 50-100 mg by mouth at bedtime.   11/23/2016 at Unknown time  . zolpidem (AMBIEN) 5 MG tablet TAKE 1 TABLET BY MOUTH EVERY NIGHT AT BEDTIME AS NEEDED FOR INSOMNIA 30  tablet 5 11/23/2016 at Unknown time  . acetaminophen (TYLENOL) 325 MG tablet Take 975 mg by mouth every 6 (six) hours as needed.   prn at prn  . ALPRAZolam (XANAX) 1 MG tablet Take 1 tablet (1 mg total) by mouth daily as needed for anxiety. (Patient not taking: Reported on 11/24/2016) 30 tablet 0 Not Taking at Unknown time  . ciprofloxacin (CIPRO) 500 MG tablet Take 1 tablet (500 mg total) by mouth 2 (two) times daily. (Patient not taking: Reported on 11/24/2016) 14 tablet 0 Not Taking at Unknown time  . fluconazole (DIFLUCAN) 200 MG tablet Take 1 tablet (200 mg total) by mouth daily. (Patient not taking: Reported on 11/24/2016) 3 tablet 0 Not Taking at Unknown time  . fluticasone (FLONASE) 50 MCG/ACT nasal spray SHAKE LIQUID AND USE 2 SPRAYS IN EACH NOSTRIL  DAILY (Patient not taking: Reported on 11/24/2016) 16 g 5 Not Taking at Unknown time  . HYDROcodone-homatropine (HYCODAN) 5-1.5 MG/5ML syrup 5 ml 4-6 hours as needed for cough (Patient not taking: Reported on 11/24/2016) 240 mL 0 Not Taking at Unknown time  . ondansetron (ZOFRAN-ODT) 8 MG disintegrating tablet Take 1 tablet (8 mg total) by mouth every 8 (eight) hours as needed for nausea or vomiting. (Patient not taking: Reported on 11/24/2016) 30 tablet 1 Not Taking at Unknown time  . polyethylene glycol (MIRALAX / GLYCOLAX) packet Take 17 g by mouth daily as needed.   prn at prn  . sulfamethoxazole-trimethoprim (BACTRIM DS,SEPTRA DS) 800-160 MG tablet Take 1 tablet by mouth 3 (three) times a week. (Patient not taking: Reported on 11/24/2016) 30 tablet 0 Not Taking at Unknown time   Scheduled: . buPROPion  300 mg Oral Daily  . busPIRone  5 mg Oral TID  . darifenacin  7.5 mg Oral Daily  . dexamethasone  2 mg Oral Daily  . enoxaparin (LOVENOX) injection  40 mg Subcutaneous Q24H  . fluticasone  2 spray Each Nare Daily  . montelukast  10 mg Oral QHS  . nystatin   Topical QID  . pantoprazole  40 mg Oral Daily  . potassium chloride SA  20 mEq Oral Daily  . protein supplement shake  11 oz Oral BID BM  . risperiDONE  0.5 mg Oral QHS  . sertraline  25 mg Oral Daily  . sulfamethoxazole-trimethoprim  1 tablet Oral Once per day on Mon Wed Fri  . traZODone  50 mg Oral QHS    ROS: History obtained from the patient and and mother  General ROS: not eating or drinking Psychological ROS: behavioral disorder Ophthalmic ROS: negative for - blurry vision, double vision, eye pain or loss of vision ENT ROS: negative for - epistaxis, nasal discharge, oral lesions, sore throat, tinnitus or vertigo Allergy and Immunology ROS: negative for - hives or itchy/watery eyes Hematological and Lymphatic ROS: negative for - bleeding problems, bruising or swollen lymph nodes Endocrine ROS: negative for - galactorrhea,  hair pattern changes, polydipsia/polyuria or temperature intolerance Respiratory ROS: negative for - cough, hemoptysis, shortness of breath or wheezing Cardiovascular ROS: negative for - chest pain, dyspnea on exertion, edema or irregular heartbeat Gastrointestinal ROS: negative for - abdominal pain, diarrhea, hematemesis, nausea/vomiting or stool incontinence Genito-Urinary ROS: negative for - dysuria, hematuria, incontinence or urinary frequency/urgency Musculoskeletal ROS: negative for - joint swelling or muscular weakness Neurological ROS: as noted in HPI Dermatological ROS: negative for rash and skin lesion changes  Physical Examination: Blood pressure (!) 103/45, pulse 95, temperature 98.4 F (36.9 C), resp. rate  20, height 5\' 4"  (1.626 m), weight 73 kg (161 lb), SpO2 100 %.  HEENT-  Normocephalic, no lesions, without obvious abnormality.  Normal external eye and conjunctiva.  Normal TM's bilaterally.  Normal auditory canals and external ears. Normal external nose, mucus membranes and septum.  Normal pharynx. Cardiovascular- S1, S2 normal, pulses palpable throughout   Lungs- chest clear, no wheezing, rales, normal symmetric air entry Abdomen- soft, non-tender; bowel sounds normal; no masses,  no organomegaly Extremities- no edema Lymph-no adenopathy palpable Musculoskeletal-no joint tenderness, deformity or swelling Skin-warm and dry, no hyperpigmentation, vitiligo, or suspicious lesions  Neurological Examination   Mental Status: Patient lying in bed with eyes closed but responds to verbal stimuli.  Knows who she is but does not recognize her mother and knows she is in [place] but does not know she is in a hospital Cranial Nerves: II: Discs flat bilaterally; Does not blink to bilateral confrontation.  Has difficulty finding my hand on the left but does know that it is there.  Finds hand easily on the right.  Pupils equal, round, reactive to light and accommodation III,IV, VI:  ptosis not present, extra-ocular motions intact bilaterally V,VII: smile symmetric, facial light touch sensation normal bilaterally VIII: hearing normal bilaterally IX,X: gag reflex present XI: bilateral shoulder shrug XII: midline tongue extension Motor: Lifts all extremities against gravity.  Laying on her left therefore unable to determine if strength symmetric.   Sensory: Pinprick and light touch intact throughout, bilaterally Deep Tendon Reflexes: 2+ and symmetric throughout Plantars: Right: downgoing   Left: downgoing Cerebellar: Unable to test due to patient's willingness to keep her eyes open Gait: not tested due to safety concerns    Laboratory Studies:   Basic Metabolic Panel:  Recent Labs Lab 11/24/16 1652 11/25/16 0623 11/27/16 0514  NA 137 138  --   K 4.1 3.2* 3.1*  CL 100* 108  --   CO2 25 24  --   GLUCOSE 145* 84  --   BUN 11 10  --   CREATININE 1.00 0.77  --   CALCIUM 9.1 8.0*  --   MG  --  2.1 2.0    Liver Function Tests:  Recent Labs Lab 11/24/16 1652  AST 21  ALT 40  ALKPHOS 62  BILITOT 2.0*  PROT 6.2*  ALBUMIN 3.8   No results for input(s): LIPASE, AMYLASE in the last 168 hours.  Recent Labs Lab 11/24/16 1652  AMMONIA 11    CBC:  Recent Labs Lab 11/24/16 1652 11/25/16 0623  WBC 5.0 3.9  HGB 15.0 13.0  HCT 43.6 37.3  MCV 97.6 97.4  PLT 148* 134*    Cardiac Enzymes: No results for input(s): CKTOTAL, CKMB, CKMBINDEX, TROPONINI in the last 168 hours.  BNP: Invalid input(s): POCBNP  CBG: No results for input(s): GLUCAP in the last 168 hours.  Microbiology: Results for orders placed or performed in visit on 11/19/16  Urine Culture     Status: Abnormal   Collection Time: 11/19/16  3:15 PM  Result Value Ref Range Status   Specimen Description URINE, CLEAN CATCH  Final   Special Requests NONE  Final   Culture 60,000 COLONIES/mL ESCHERICHIA COLI (A)  Final   Report Status 11/21/2016 FINAL  Final   Organism ID, Bacteria  ESCHERICHIA COLI (A)  Final      Susceptibility   Escherichia coli - MIC*    AMPICILLIN 8 SENSITIVE Sensitive     CEFAZOLIN <=4 SENSITIVE Sensitive     CEFTRIAXONE <=1  SENSITIVE Sensitive     CIPROFLOXACIN <=0.25 SENSITIVE Sensitive     GENTAMICIN <=1 SENSITIVE Sensitive     IMIPENEM <=0.25 SENSITIVE Sensitive     NITROFURANTOIN <=16 SENSITIVE Sensitive     TRIMETH/SULFA >=320 RESISTANT Resistant     AMPICILLIN/SULBACTAM 8 SENSITIVE Sensitive     PIP/TAZO <=4 SENSITIVE Sensitive     Extended ESBL NEGATIVE Sensitive     * 60,000 COLONIES/mL ESCHERICHIA COLI    Coagulation Studies: No results for input(s): LABPROT, INR in the last 72 hours.  Urinalysis:   Recent Labs Lab 11/24/16 2255  COLORURINE YELLOW*  LABSPEC >1.046*  PHURINE 5.0  GLUCOSEU NEGATIVE  HGBUR NEGATIVE  BILIRUBINUR MODERATE*  KETONESUR 5*  PROTEINUR 100*  NITRITE NEGATIVE  LEUKOCYTESUR NEGATIVE    Lipid Panel:     Component Value Date/Time   CHOL 178 03/01/2015 0959   TRIG 131 03/01/2015 0959   HDL 46 03/01/2015 0959   CHOLHDL 3.9 03/01/2015 0959   LDLCALC 106 (H) 03/01/2015 0959    HgbA1C: No results found for: HGBA1C  Urine Drug Screen:      Component Value Date/Time   LABOPIA NONE DETECTED 11/25/2016 1822   COCAINSCRNUR NONE DETECTED 11/25/2016 1822   LABBENZ NONE DETECTED 11/25/2016 1822   AMPHETMU NONE DETECTED 11/25/2016 1822   THCU NONE DETECTED 11/25/2016 1822   LABBARB NONE DETECTED 11/25/2016 1822    Alcohol Level: No results for input(s): ETH in the last 168 hours.   Imaging: Mr Jeri Cos ID Contrast  Addendum Date: 11/27/2016   ADDENDUM REPORT: 11/27/2016 00:41 ADDENDUM: Reduced diffusion associated with tumor. No reduced diffusion to suggest acute ischemia. Electronically Signed   By: Elon Alas M.D.   On: 11/27/2016 00:41   Result Date: 11/27/2016 CLINICAL DATA:  Altered mental status. History of glioblastoma, on chemo radiation. EXAM: MRI HEAD WITHOUT AND WITH  CONTRAST TECHNIQUE: Multiplanar, multiecho pulse sequences of the brain and surrounding structures were obtained without and with intravenous contrast. CONTRAST:  110mL MULTIHANCE GADOBENATE DIMEGLUMINE 529 MG/ML IV SOLN COMPARISON:  CT HEAD October 15, 2016 and MRI of the head July 08, 2016 FINDINGS: Some sequences are moderately motion degraded. INTRACRANIAL CONTENTS: Similar appearance of large RIGHT parietal occipital resection cavity with surrounding irregular FLAIR T2 hyperintense signal along the superior margin with associated reduced diffusion. The abnormal signal extending to the LEFT splenium of corpus callosum has decreased. Patchy new nonenhancing abnormal FLAIR T2 hyperintense signal along RIGHT temporal horn periventricular margin. Decreased size of subcentimeter hemorrhage RIGHT lateral ventricle body with T1 shortening. Decreased perimarginal enhancement from prior MRI, decreased RIGHT lateral ventricle subependymal enhancement. No hydrocephalus. No midline shift or mass effect. No abnormal extra-axial fluid collections. Minimal smooth RIGHT dural thickening consistent with prior surgery and, decreased from prior MRI. VASCULAR: Normal major intracranial vascular flow voids present at skull base. SKULL AND UPPER CERVICAL SPINE: Old RIGHT craniotomy. No abnormal sellar expansion. No suspicious calvarial bone marrow signal. Craniocervical junction maintained. SINUSES/ORBITS: RIGHT mastoid effusion. Trace paranasal sinus mucosal thickening. The included ocular globes and orbital contents are non-suspicious. OTHER: None. IMPRESSION: 1. No acute intracranial process. 2. New nonenhancing RIGHT temporal periventricular suspected nonenhancing tumor. 3. Local treatment response: Decreased RIGHT peri-resection tumor in a background of treatment related changes. Electronically Signed: By: Elon Alas M.D. On: 11/26/2016 22:59   US Abdomen Limited Ruq  Result Date: 11/26/2016 CLINICAL DATA:  Elevated  bilirubin EXAM: ULTRASOUND ABDOMEN LIMITED RIGHT UPPER QUADRANT COMPARISON:  08/22/2012 FINDINGS: Gallbladder: No gallstones or wall thickening  visualized. No sonographic Murphy sign noted by sonographer. Common bile duct: Diameter: 4 mm.  Where visualized, no filling defect. Liver: No focal lesion identified. Echogenic with some sparing seen about the gallbladder fossa. No evidence of intrahepatic duct dilatation. Portal vein is patent on color Doppler imaging with normal direction of blood flow towards the liver. IMPRESSION: 1. Negative gallbladder.  No bile duct dilatation. 2. Hepatic steatosis. Electronically Signed   By: Monte Fantasia M.D.   On: 11/26/2016 08:50     Assessment/Plan: 61 year old female with GBS s/p resection undergoing chemo with AMS.  Recent imaging in September of this year showed progression of her GBM.  Suspect worsening mental status related to same.  Patient responding and following commands therefore despite there being an interruption in anticonvulsant therapy do not suspect altered mental status being related to seizure activity.    MRI reviewed pt does have possibility of new R temporal tumor.   Procedure was done at [facility] by Dr. Tommi Rumps She has been getting worse since May of this year. Unclear if this is due to tumor or just progression of disease No further imaging at this time. She needs follow up with [facility].

## 2016-11-27 NOTE — Progress Notes (Signed)
[place] at [place] NAME: Jessica [name]    MR#:  [id number]  DATE OF BIRTH:  06-24-1955  SUBJECTIVE:   Patient's MRI from yesterday showing a new right-sided tumor.  Mental status not much improved since yesterday.  Patient's mother is at bedside.  I had an extensive discussion with the mother about patient's poor prognosis.  Palliative care consult has been ordered, discussed the case with Dr. Mike Gip.  REVIEW OF SYSTEMS:    Review of Systems  Constitutional: Negative for chills and fever.  HENT: Negative for congestion and tinnitus.   Eyes: Negative for blurred vision and double vision.  Respiratory: Negative for cough, shortness of breath and wheezing.   Cardiovascular: Negative for chest pain, orthopnea and PND.  Gastrointestinal: Negative for abdominal pain, diarrhea, nausea and vomiting.  Genitourinary: Negative for dysuria and hematuria.  Neurological: Negative for dizziness, sensory change and focal weakness.  All other systems reviewed and are negative.   Nutrition: Dsyphagia I diet Tolerating Diet: Yes but very poor.  Tolerating PT: Eval noted.   DRUG ALLERGIES:   Allergies  Allergen Reactions  . Compazine  [Prochlorperazine] Anaphylaxis    swelling, SOB.  . Augmentin [Amoxicillin-Pot Clavulanate] Nausea And Vomiting  . Oxycodone Nausea Only    VITALS:  Blood pressure (!) 109/58, pulse 92, temperature 98.1 F (36.7 C), resp. rate 18, height 5\' 4"  (1.626 m), weight 73 kg (161 lb), SpO2 97 %.  PHYSICAL EXAMINATION:   Physical Exam  GENERAL:  61 y.o.-year-old patient lying in bed follows simple commands but remains lethargic.  EYES: Pupils equal, round, reactive to light and accommodation. No scleral icterus. Extraocular muscles intact.  HEENT: Head atraumatic, normocephalic. Oropharynx and nasopharynx clear.  NECK:  Supple, no jugular venous distention. No thyroid enlargement, no tenderness.  LUNGS: Normal breath  sounds bilaterally, no wheezing, rales, rhonchi. No use of accessory muscles of respiration.  CARDIOVASCULAR: S1, S2 normal. No murmurs, rubs, or gallops.  ABDOMEN: Soft, nontender, nondistended. Bowel sounds present. No organomegaly or mass.  EXTREMITIES: No cyanosis, clubbing or edema b/l.    NEUROLOGIC: Cranial nerves II through XII are intact. No focal Motor or sensory deficits b/l.  Globally weak.  PSYCHIATRIC: The patient is alert and oriented x 3.  SKIN: No obvious rash, lesion, or ulcer.    LABORATORY PANEL:   CBC  Recent Labs Lab 11/25/16 0623  WBC 3.9  HGB 13.0  HCT 37.3  PLT 134*   ------------------------------------------------------------------------------------------------------------------  Chemistries   Recent Labs Lab 11/24/16 1652  11/25/16 0623 11/27/16 0514  NA 137  --  138  --   K 4.1  --  3.2* 3.1*  CL 100*  --  108  --   CO2 25  --  24  --   GLUCOSE 145*  --  84  --   BUN 11  --  10  --   CREATININE 1.00  --  0.77  --   CALCIUM 9.1  --  8.0*  --   MG  --   < > 2.1 2.0  AST 21  --   --   --   ALT 40  --   --   --   ALKPHOS 62  --   --   --   BILITOT 2.0*  --   --   --   < > = values in this interval not displayed. ------------------------------------------------------------------------------------------------------------------  Cardiac Enzymes No results for input(s): TROPONINI in the last 168  hours. ------------------------------------------------------------------------------------------------------------------  RADIOLOGY:  Mr Jeri Cos Wo Contrast  Addendum Date: 11/27/2016   ADDENDUM REPORT: 11/27/2016 00:41 ADDENDUM: Reduced diffusion associated with tumor. No reduced diffusion to suggest acute ischemia. Electronically Signed   By: Elon Alas M.D.   On: 11/27/2016 00:41   Result Date: 11/27/2016 CLINICAL DATA:  Altered mental status. History of glioblastoma, on chemo radiation. EXAM: MRI HEAD WITHOUT AND WITH CONTRAST  TECHNIQUE: Multiplanar, multiecho pulse sequences of the brain and surrounding structures were obtained without and with intravenous contrast. CONTRAST:  63mL MULTIHANCE GADOBENATE DIMEGLUMINE 529 MG/ML IV SOLN COMPARISON:  CT HEAD October 15, 2016 and MRI of the head July 08, 2016 FINDINGS: Some sequences are moderately motion degraded. INTRACRANIAL CONTENTS: Similar appearance of large RIGHT parietal occipital resection cavity with surrounding irregular FLAIR T2 hyperintense signal along the superior margin with associated reduced diffusion. The abnormal signal extending to the LEFT splenium of corpus callosum has decreased. Patchy new nonenhancing abnormal FLAIR T2 hyperintense signal along RIGHT temporal horn periventricular margin. Decreased size of subcentimeter hemorrhage RIGHT lateral ventricle body with T1 shortening. Decreased perimarginal enhancement from prior MRI, decreased RIGHT lateral ventricle subependymal enhancement. No hydrocephalus. No midline shift or mass effect. No abnormal extra-axial fluid collections. Minimal smooth RIGHT dural thickening consistent with prior surgery and, decreased from prior MRI. VASCULAR: Normal major intracranial vascular flow voids present at skull base. SKULL AND UPPER CERVICAL SPINE: Old RIGHT craniotomy. No abnormal sellar expansion. No suspicious calvarial bone marrow signal. Craniocervical junction maintained. SINUSES/ORBITS: RIGHT mastoid effusion. Trace paranasal sinus mucosal thickening. The included ocular globes and orbital contents are non-suspicious. OTHER: None. IMPRESSION: 1. No acute intracranial process. 2. New nonenhancing RIGHT temporal periventricular suspected nonenhancing tumor. 3. Local treatment response: Decreased RIGHT peri-resection tumor in a background of treatment related changes. Electronically Signed: By: Elon Alas M.D. On: 11/26/2016 22:59   US Abdomen Limited Ruq  Result Date: 11/26/2016 CLINICAL DATA:  Elevated bilirubin  EXAM: ULTRASOUND ABDOMEN LIMITED RIGHT UPPER QUADRANT COMPARISON:  08/22/2012 FINDINGS: Gallbladder: No gallstones or wall thickening visualized. No sonographic Murphy sign noted by sonographer. Common bile duct: Diameter: 4 mm.  Where visualized, no filling defect. Liver: No focal lesion identified. Echogenic with some sparing seen about the gallbladder fossa. No evidence of intrahepatic duct dilatation. Portal vein is patent on color Doppler imaging with normal direction of blood flow towards the liver. IMPRESSION: 1. Negative gallbladder.  No bile duct dilatation. 2. Hepatic steatosis. Electronically Signed   By: Monte Fantasia M.D.   On: 11/26/2016 08:50     ASSESSMENT AND PLAN:   61 year old female with past medical history of glioblastoma status post craniotomy with ongoing chemotherapy and radiation, anxiety/depression, hyperlipidemia, GERD, recent urinary tract infection who presents to the hospital due to altered mental status.  1. Altered mental status - etiology unclear presently. Initially thought to be metabolic in nature but workup so far has been negative. Patient's urinalysis negative for UTI, urine drug screen is negative, ammonia level is normal. -This could just be related to patient's progressive malignancy with glioblastoma. -Appreciate neurology consult, unclear if patient's mental status changes secondary to the progression of her tumor.  The MRI from yesterday does show a new lesion on the right temporal lobe.  2. Urinary tract infection-this has now been ruled out. Patient to receive 5 doses of IV ceftriaxone. - urine cultures are (-).   3. History of anxiety/depression-continue Xanax, BuSpar, Zoloft, Risperdal. - Depakote level normal.    4. Hyperlipidemia-continue atorvastatin.  5.  GERD-continue Protonix.  6. History of glioblastoma status post craniotomy with currently undergoing chemotherapy-patient is followed by Dr. Mike Gip. - seen by Oncology and MRI  yesterday showing possible worsening right temporal lobe tumor.  - await further Oncology input (discussed w/ Dr. Mike Gip). Palliative care consult to discuss goals of care.   7. Urinary incontinence-continue Vesicare.  8. Elevated Bili - no abdominal pain. Chronic and abdominal US showing no acute pathology.   9. Hypokalemia/hyopmagnesemia - will cont. To replace and monitor.   Patient's prognosis is fairly poor, had an extensive discussion with the patient's mother and aunt at bedside.  Await for the palliative care input.  All the records are reviewed and case discussed with Care Management/Social Worker. Management plans discussed with the patient, family and they are in agreement.  CODE STATUS: Full code  DVT Prophylaxis: Lovenox  TOTAL TIME TAKING CARE OF THIS PATIENT: 40 minutes.   POSSIBLE D/C IN 2-3 DAYS, DEPENDING ON CLINICAL CONDITION.   Henreitta Leber M.D on 11/27/2016 at 2:40 PM  Between 7am to 6pm - Pager - [phone]  After 6pm go to [email or web address] - [employer] [facility]  Office  [phone]  CC: Primary care physician; Kirk Ruths, MD

## 2016-11-27 NOTE — Progress Notes (Signed)
PASARR has been received, [phone]. Patient can D/C to [facility] when medically stable.   [employer], LCSW [phone]

## 2016-11-27 NOTE — Progress Notes (Signed)
PT Cancellation Note  Patient Details Name: Jessica [name] MRN: [id number] DOB: May 14, 1955   Cancelled Treatment:    Reason Eval/Treat Not Completed: Other (comment) Chart reviewed, pt very lethargic and unable to follow simple commands, unable to participate in meaningful PT at this time. Will re-attempt at next available date  Manfred Arch, [place] Manfred Arch 11/27/2016, 2:46 PM

## 2016-11-27 NOTE — Progress Notes (Signed)
Please note, patient is currently followed by Out Patient Palliative. [place] and CSW Mel Almond Sample made aware. Thank you. Flo Shanks RN, BSN, [place] [facility] hospital Liaison [phone]

## 2016-11-27 NOTE — Plan of Care (Signed)
Problem: SLP Dysphagia Goals Goal: Misc Dysphagia Goal Pt will safely tolerate po diet of least restrictive consistency w/ no overt s/s of aspiration noted by Staff/pt/family x3 sessions.    

## 2016-11-27 NOTE — Plan of Care (Signed)
Met with husband and mother. They state an MRI was completed last night and they have not spoken with the doctor today who ordered it about the results. They are not prepared to make any decisions going forward without talking to this MD further.

## 2016-11-28 DIAGNOSIS — G9340 Encephalopathy, unspecified: Secondary | ICD-10-CM | POA: Diagnosis not present

## 2016-11-28 LAB — FOLATE: Folate: 4.6 ng/mL — ABNORMAL LOW (ref >5.9)

## 2016-11-28 LAB — VITAMIN B1: Vitamin B1 (Thiamine): 71 nmol/L (ref 66.5–200.0)

## 2016-11-28 MED ORDER — CYANOCOBALAMIN 1000 MCG/ML IJ SOLN
1000.0000 ug | Freq: Once | INTRAMUSCULAR | Status: AC
  Administered 2016-11-28: 1000 ug via INTRAMUSCULAR
  Filled 2016-11-28: qty 1

## 2016-11-28 MED ORDER — PREMIER PROTEIN SHAKE: 11.0000 [oz_av] | Freq: Two times a day (BID) | ORAL | 1 refills | Status: AC

## 2016-11-28 NOTE — Progress Notes (Addendum)
LCSW called Jessica [name] at [employer] and obtained room number 708 and Call report number [phone] LCSW faxed over DC summary to [facility] and sent it through the hub and fax, Fl2 was signed and sent. LCSW called Patients husband Jessica [name]  and informed him of his wife's room number  708. Consulted nurse and provided room number and call report number and prepared envelope and completed med necessity form .   [employer] LCSW [phone]

## 2016-11-28 NOTE — Discharge Summary (Signed)
[place] at [place] NAME: Jessica [name]    MR#:  [id number]  DATE OF BIRTH:  Oct 19, 1955  DATE OF ADMISSION:  11/24/2016 ADMITTING PHYSICIAN: Henreitta Leber, MD  DATE OF DISCHARGE: 11/28/2016  PRIMARY CARE PHYSICIAN: Kirk Ruths, MD    ADMISSION DIAGNOSIS:  uti  DISCHARGE DIAGNOSIS:  Active Problems:   Altered mental status   SECONDARY DIAGNOSIS:   Past Medical History:  Diagnosis Date  . Asthma    Pt does not use inhaler   . Elevated cholesterol   . Glioblastoma ([place])   . History of chicken pox   . History of measles   . History of mumps     HOSPITAL COURSE:   61 y.o. female with glioblastoma multiforme s/p resection, radiation and concurrent Temodar who was admitted from the oncology clinic with ongoing decline in functional status and altered mental status   1.  Acute encephalopathy in the setting of progressive glioblastoma. Metabolic workup has been negative.  Patient did not have urinary tract infection.  Mental status is improving.  She was also evaluated by neurology MRI showed a new lesion on the right temporal lobe.  2.  Glioblastoma multiforme: Patient was seen and evaluated by oncology.  She has a Child psychotherapist at [facility] which she will follow up with.  Patient's performance status is extremely poor as well as her overall prognosis.  She would benefit from palliative care.  She will be referred to palliative care as an outpatient. She should continue Decadron 2 mg a day and Bactrim prophylaxis 3 times a week for PCP prophylaxis.  3.  B12 deficiency: Patient had an injection of B12 at the day of discharge.  She will need this weekly x6 then monthly.  4.  Anxiety: Patient with outpatient medication   DISCHARGE CONDITIONS AND DIET:  Guarded condition Dysphasia 1 diet  CONSULTS OBTAINED:  Treatment Team:  Lequita Asal, MD Sindy Guadeloupe, MD Alexis Goodell, MD  DRUG ALLERGIES:   Allergies   Allergen Reactions  . Compazine  [Prochlorperazine] Anaphylaxis    swelling, SOB.  . Augmentin [Amoxicillin-Pot Clavulanate] Nausea And Vomiting  . Oxycodone Nausea Only    DISCHARGE MEDICATIONS:   Current Discharge Medication List    START taking these medications   Details  protein supplement shake (PREMIER PROTEIN) LIQD Take 325 mLs (11 oz total) by mouth 2 (two) times daily between meals. Qty: 19500 mL, Refills: 1      CONTINUE these medications which have NOT CHANGED   Details  buPROPion (WELLBUTRIN XL) 300 MG 24 hr tablet Take 1 tablet (300 mg total) by mouth daily. Qty: 90 tablet, Refills: 3    busPIRone (BUSPAR) 5 MG tablet Take 1 tablet (5 mg total) by mouth 3 (three) times daily. Qty: 270 tablet, Refills: 3    dexamethasone (DECADRON) 2 MG tablet Take 1 tablet by mouth daily. Refills: 0    montelukast (SINGULAIR) 10 MG tablet Take 10 mg by mouth at bedtime.    nystatin (MYCOSTATIN/NYSTOP) powder Apply topically 4 (four) times daily. Use x 5 days then PRN. Qty: 15 g, Refills: 1    omeprazole (PRILOSEC) 20 MG capsule Take 1 capsule (20 mg total) by mouth daily. Qty: 90 capsule, Refills: 3    potassium chloride SA (K-DUR,KLOR-CON) 20 MEQ tablet Take 1 tablet (20 mEq total) by mouth daily. Qty: 7 tablet, Refills: 0    risperiDONE (RISPERDAL) 0.5 MG tablet Take 0.5 mg by mouth  at bedtime.     sertraline (ZOLOFT) 25 MG tablet Take 25 mg by mouth daily.     solifenacin (VESICARE) 10 MG tablet Take 10 mg by mouth daily.     traZODone (DESYREL) 50 MG tablet Take 50-100 mg by mouth at bedtime.    zolpidem (AMBIEN) 5 MG tablet TAKE 1 TABLET BY MOUTH EVERY NIGHT AT BEDTIME AS NEEDED FOR INSOMNIA Qty: 30 tablet, Refills: 5    acetaminophen (TYLENOL) 325 MG tablet Take 975 mg by mouth every 6 (six) hours as needed.    polyethylene glycol (MIRALAX / GLYCOLAX) packet Take 17 g by mouth daily as needed.    sulfamethoxazole-trimethoprim (BACTRIM DS,SEPTRA DS) 800-160  MG tablet Take 1 tablet by mouth 3 (three) times a week. Qty: 30 tablet, Refills: 0   Associated Diagnoses: Glioblastoma ([place]); Homonymous hemianopia, left; Elevated bilirubin      STOP taking these medications     atorvastatin (LIPITOR) 40 MG tablet      divalproex (DEPAKOTE) 500 MG DR tablet      temozolomide (TEMODAR) 100 MG capsule      ALPRAZolam (XANAX) 1 MG tablet      ciprofloxacin (CIPRO) 500 MG tablet      fluconazole (DIFLUCAN) 200 MG tablet      fluticasone (FLONASE) 50 MCG/ACT nasal spray      HYDROcodone-homatropine (HYCODAN) 5-1.5 MG/5ML syrup      ondansetron (ZOFRAN-ODT) 8 MG disintegrating tablet      mometasone (NASONEX) 50 MCG/ACT nasal spray           Today   CHIEF COMPLAINT:   No acute events overnight Mother at bedisde   VITAL SIGNS:  Blood pressure 137/78, pulse 76, temperature 98.2 F (36.8 C), temperature source Oral, resp. rate (!) 21, height 5\' 4"  (1.626 m), weight 73 kg (161 lb), SpO2 99 %.   REVIEW OF SYSTEMS:  Review of Systems  Constitutional: Positive for malaise/fatigue. Negative for chills and fever.  HENT: Negative.  Negative for ear discharge, ear pain, hearing loss, nosebleeds and sore throat.   Eyes: Negative.  Negative for blurred vision and pain.  Respiratory: Negative.  Negative for cough, hemoptysis, shortness of breath and wheezing.   Cardiovascular: Negative.  Negative for chest pain, palpitations and leg swelling.  Gastrointestinal: Negative.  Negative for abdominal pain, blood in stool, diarrhea, nausea and vomiting.  Genitourinary: Negative.  Negative for dysuria.  Musculoskeletal: Negative.  Negative for back pain.  Skin: Negative.   Neurological: Positive for weakness. Negative for dizziness, tremors, speech change, focal weakness, seizures and headaches.  Endo/Heme/Allergies: Negative.  Does not bruise/bleed easily.  Psychiatric/Behavioral: Positive for memory loss. Negative for depression, hallucinations  and suicidal ideas.     PHYSICAL EXAMINATION:  61 y.o.-year-old patient patient lying in the bed with no acute distress. Follows commands NECK:  Supple, no jugular venous distention. No thyroid enlargement, no tenderness.  LUNGS: Normal breath sounds bilaterally, no wheezing, rales,rhonchi  No use of accessory muscles of respiration.  CARDIOVASCULAR: S1, S2 normal. No murmurs, rubs, or gallops.  ABDOMEN: Soft, non-tender, non-distended. Bowel sounds present. No organomegaly or mass.  EXTREMITIES: No pedal edema, cyanosis, or clubbing.  PSYCHIATRIC: The patient is alert and oriented x 3.  SKIN: No obvious rash, lesion, or ulcer.   DATA REVIEW:   CBC  Recent Labs Lab 11/25/16 0623  WBC 3.9  HGB 13.0  HCT 37.3  PLT 134*    Chemistries   Recent Labs Lab 11/24/16 1652  11/25/16 2025  11/27/16 0514  NA 137  --  138  --   K 4.1  --  3.2* 3.1*  CL 100*  --  108  --   CO2 25  --  24  --   GLUCOSE 145*  --  84  --   BUN 11  --  10  --   CREATININE 1.00  --  0.77  --   CALCIUM 9.1  --  8.0*  --   MG  --   < > 2.1 2.0  AST 21  --   --   --   ALT 40  --   --   --   ALKPHOS 62  --   --   --   BILITOT 2.0*  --   --   --   < > = values in this interval not displayed.  Cardiac Enzymes No results for input(s): TROPONINI in the last 168 hours.  Microbiology Results  @[id number] @  RADIOLOGY:  Mr Jeri Cos NK Contrast  Addendum Date: 11/27/2016   ADDENDUM REPORT: 11/27/2016 00:41 ADDENDUM: Reduced diffusion associated with tumor. No reduced diffusion to suggest acute ischemia. Electronically Signed   By: Elon Alas M.D.   On: 11/27/2016 00:41   Result Date: 11/27/2016 CLINICAL DATA:  Altered mental status. History of glioblastoma, on chemo radiation. EXAM: MRI HEAD WITHOUT AND WITH CONTRAST TECHNIQUE: Multiplanar, multiecho pulse sequences of the brain and surrounding structures were obtained without and with intravenous contrast. CONTRAST:  80mL MULTIHANCE GADOBENATE  DIMEGLUMINE 529 MG/ML IV SOLN COMPARISON:  CT HEAD October 15, 2016 and MRI of the head July 08, 2016 FINDINGS: Some sequences are moderately motion degraded. INTRACRANIAL CONTENTS: Similar appearance of large RIGHT parietal occipital resection cavity with surrounding irregular FLAIR T2 hyperintense signal along the superior margin with associated reduced diffusion. The abnormal signal extending to the LEFT splenium of corpus callosum has decreased. Patchy new nonenhancing abnormal FLAIR T2 hyperintense signal along RIGHT temporal horn periventricular margin. Decreased size of subcentimeter hemorrhage RIGHT lateral ventricle body with T1 shortening. Decreased perimarginal enhancement from prior MRI, decreased RIGHT lateral ventricle subependymal enhancement. No hydrocephalus. No midline shift or mass effect. No abnormal extra-axial fluid collections. Minimal smooth RIGHT dural thickening consistent with prior surgery and, decreased from prior MRI. VASCULAR: Normal major intracranial vascular flow voids present at skull base. SKULL AND UPPER CERVICAL SPINE: Old RIGHT craniotomy. No abnormal sellar expansion. No suspicious calvarial bone marrow signal. Craniocervical junction maintained. SINUSES/ORBITS: RIGHT mastoid effusion. Trace paranasal sinus mucosal thickening. The included ocular globes and orbital contents are non-suspicious. OTHER: None. IMPRESSION: 1. No acute intracranial process. 2. New nonenhancing RIGHT temporal periventricular suspected nonenhancing tumor. 3. Local treatment response: Decreased RIGHT peri-resection tumor in a background of treatment related changes. Electronically Signed: By: Elon Alas M.D. On: 11/26/2016 22:59      Current Discharge Medication List    START taking these medications   Details  protein supplement shake (PREMIER PROTEIN) LIQD Take 325 mLs (11 oz total) by mouth 2 (two) times daily between meals. Qty: 19500 mL, Refills: 1      CONTINUE these  medications which have NOT CHANGED   Details  buPROPion (WELLBUTRIN XL) 300 MG 24 hr tablet Take 1 tablet (300 mg total) by mouth daily. Qty: 90 tablet, Refills: 3    busPIRone (BUSPAR) 5 MG tablet Take 1 tablet (5 mg total) by mouth 3 (three) times daily. Qty: 270 tablet, Refills: 3    dexamethasone (DECADRON) 2 MG tablet Take  1 tablet by mouth daily. Refills: 0    montelukast (SINGULAIR) 10 MG tablet Take 10 mg by mouth at bedtime.    nystatin (MYCOSTATIN/NYSTOP) powder Apply topically 4 (four) times daily. Use x 5 days then PRN. Qty: 15 g, Refills: 1    omeprazole (PRILOSEC) 20 MG capsule Take 1 capsule (20 mg total) by mouth daily. Qty: 90 capsule, Refills: 3    potassium chloride SA (K-DUR,KLOR-CON) 20 MEQ tablet Take 1 tablet (20 mEq total) by mouth daily. Qty: 7 tablet, Refills: 0    risperiDONE (RISPERDAL) 0.5 MG tablet Take 0.5 mg by mouth at bedtime.     sertraline (ZOLOFT) 25 MG tablet Take 25 mg by mouth daily.     solifenacin (VESICARE) 10 MG tablet Take 10 mg by mouth daily.     traZODone (DESYREL) 50 MG tablet Take 50-100 mg by mouth at bedtime.    zolpidem (AMBIEN) 5 MG tablet TAKE 1 TABLET BY MOUTH EVERY NIGHT AT BEDTIME AS NEEDED FOR INSOMNIA Qty: 30 tablet, Refills: 5    acetaminophen (TYLENOL) 325 MG tablet Take 975 mg by mouth every 6 (six) hours as needed.    polyethylene glycol (MIRALAX / GLYCOLAX) packet Take 17 g by mouth daily as needed.    sulfamethoxazole-trimethoprim (BACTRIM DS,SEPTRA DS) 800-160 MG tablet Take 1 tablet by mouth 3 (three) times a week. Qty: 30 tablet, Refills: 0   Associated Diagnoses: Glioblastoma ([place]); Homonymous hemianopia, left; Elevated bilirubin      STOP taking these medications     atorvastatin (LIPITOR) 40 MG tablet      divalproex (DEPAKOTE) 500 MG DR tablet      temozolomide (TEMODAR) 100 MG capsule      ALPRAZolam (XANAX) 1 MG tablet      ciprofloxacin (CIPRO) 500 MG tablet      fluconazole (DIFLUCAN)  200 MG tablet      fluticasone (FLONASE) 50 MCG/ACT nasal spray      HYDROcodone-homatropine (HYCODAN) 5-1.5 MG/5ML syrup      ondansetron (ZOFRAN-ODT) 8 MG disintegrating tablet      mometasone (NASONEX) 50 MCG/ACT nasal spray            Management plans discussed with the patient and mother and she is in agreement. Stable for discharge   Patient should follow up with oncology  CODE STATUS:     Code Status Orders        Start     Ordered   11/24/16 1637  Full code  Continuous     11/24/16 1636    Code Status History    Date Active Date Inactive Code Status Order ID Comments User Context   This patient has a current code status but no historical code status.    Advance Directive Documentation     Most Recent Value  Type of Advance Directive  Healthcare Power of Attorney  Pre-existing out of facility DNR order (yellow form or pink MOST form)  -  "MOST" Form in Place?  -      TOTAL TIME TAKING CARE OF THIS PATIENT: 37 minutes.    Note: This dictation was prepared with Dragon dictation along with smaller phrase technology. Any transcriptional errors that result from this process are unintentional.  Shanterica Biehler M.D on 11/28/2016 at 10:30 AM  Between 7am to 6pm - Pager - [phone] After 6pm go to [email or web address] - password [email or web address] [facility]  Office  [phone]  CC: Primary care physician; Kirk Ruths, MD

## 2016-11-28 NOTE — Progress Notes (Signed)
[facility] Hematology/Oncology Progress Note  Date of admission: 11/24/2016  Hospital day:  11/27/2016  Chief Complaint: Jessica [name] is a 61 y.o. female with glioblastoma multiforme s/p resection, radiation and concurrent Temodar who was admitted from the oncology clinic with ongoing decline in functional status and altered mental status on day 12 s/p cycle #1 monthly Temodar and Avastin.  Subjective:  Feels "the same".  Social History: The patient is accompanied by her husband and mother today.  Allergies:  Allergies  Allergen Reactions  . Compazine  [Prochlorperazine] Anaphylaxis    swelling, SOB.  . Augmentin [Amoxicillin-Pot Clavulanate] Nausea And Vomiting  . Oxycodone Nausea Only    Scheduled Medications: . buPROPion  300 mg Oral Daily  . busPIRone  5 mg Oral TID  . darifenacin  7.5 mg Oral Daily  . dexamethasone  2 mg Oral Daily  . enoxaparin (LOVENOX) injection  40 mg Subcutaneous Q24H  . fluticasone  2 spray Each Nare Daily  . montelukast  10 mg Oral QHS  . nystatin   Topical QID  . pantoprazole  40 mg Oral Daily  . potassium chloride SA  20 mEq Oral Daily  . protein supplement shake  11 oz Oral BID BM  . risperiDONE  0.5 mg Oral QHS  . sertraline  25 mg Oral Daily  . traZODone  50 mg Oral QHS    Review of Systems: GENERAL: Feels "the same".  No complaints. No fevers or sweats.  PERFORMANCE STATUS (ECOG): 3-4. HEENT: Bilateral vision loss. No sore throat, mouth sores or tenderness. Lungs: No shortness of breath or cough. No hemoptysis. Cardiac: No chest pain, palpitations, orthopnea, or PND. GI: Eating "some". Constipation. No nausea, vomiting, diarrhea, melena or hematochezia. Pain pills cause emesis. GU: No urgency, frequency, dysuria, or hematuria. Musculoskeletal: No back pain. No joint pain. No muscle tenderness. Extremities: Legs are weak. No pain or swelling. Skin: No rashes or skin changes. Neuro: Left  homonymous hemianopsia. Steady decline in vision. Unsteady on feet. No headache or focal numbness. Endocrine: No diabetes, thyroid issues, hot flashes or night sweats. Psych: Anxiety attacks (confused at times). Pain: No focal pain. Review of systems: All other systems reviewed and found to be negative.  Physical Exam: Blood pressure 137/78, pulse 76, temperature 98.2 F (36.8 C), temperature source Oral, resp. rate (!) 21, height 5\' 4"  (1.626 m), weight 161 lb (73 kg), SpO2 99 %.  GENERAL:Chronically fatigued appearing woman curled up in bed in no acute distress. MENTAL STATUS: Alert and oriented to person only.  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 She does not know the month or year. HEAD:Shoulder length light brown hair. Cushingoid features. Normocephalic, atraumatic, and symmetric.  EYES:Prefers keeping eyes closed.  Blue eyes. No conjunctivitis or scleral icterus. SKIN: No rashes. EXTREMITIES: No edema, no skin discoloration or tenderness. No palpable cords. NEUROLOGICAL: Does not know my name or the other people in the room.  Does not know Software engineer. Does not want to count.  Face symmetric. Sensation intact. Can reach out to grap hand.  Left leg stronger than right leg. PSYCH: Engaged, but drifts off. Talks with prompting.  Tearful when discussing placement and prognosis.   Results for orders placed or performed during the hospital encounter of 11/24/16 (from the past 48 hour(s))  Vitamin B12     Status: Abnormal   Collection Time: 11/26/16  2:45 PM  Result Value Ref Range   Vitamin B-12 66 (L) 180 - 914 pg/mL  Comment: (NOTE) This assay is not validated for testing neonatal or myeloproliferative syndrome specimens for Vitamin B12 levels. Performed at [facility], [place] [address], [place], [place] [address]   TSH     Status: None   Collection Time: 11/26/16  2:45 PM  Result Value Ref Range   TSH 1.446 0.350 - 4.500 uIU/mL     Comment: Performed by a 3rd Generation assay with a functional sensitivity of <=0.01 uIU/mL.  Potassium     Status: Abnormal   Collection Time: 11/27/16  5:14 AM  Result Value Ref Range   Potassium 3.1 (L) 3.5 - 5.1 mmol/L  Magnesium     Status: None   Collection Time: 11/27/16  5:14 AM  Result Value Ref Range   Magnesium 2.0 1.7 - 2.4 mg/dL   Mr Jeri Cos EG Contrast  Addendum Date: 11/27/2016   ADDENDUM REPORT: 11/27/2016 00:41 ADDENDUM: Reduced diffusion associated with tumor. No reduced diffusion to suggest acute ischemia. Electronically Signed   By: Elon Alas M.D.   On: 11/27/2016 00:41   Result Date: 11/27/2016 CLINICAL DATA:  Altered mental status. History of glioblastoma, on chemo radiation. EXAM: MRI HEAD WITHOUT AND WITH CONTRAST TECHNIQUE: Multiplanar, multiecho pulse sequences of the brain and surrounding structures were obtained without and with intravenous contrast. CONTRAST:  67mL MULTIHANCE GADOBENATE DIMEGLUMINE 529 MG/ML IV SOLN COMPARISON:  CT HEAD October 15, 2016 and MRI of the head July 08, 2016 FINDINGS: Some sequences are moderately motion degraded. INTRACRANIAL CONTENTS: Similar appearance of large RIGHT parietal occipital resection cavity with surrounding irregular FLAIR T2 hyperintense signal along the superior margin with associated reduced diffusion. The abnormal signal extending to the LEFT splenium of corpus callosum has decreased. Patchy new nonenhancing abnormal FLAIR T2 hyperintense signal along RIGHT temporal horn periventricular margin. Decreased size of subcentimeter hemorrhage RIGHT lateral ventricle body with T1 shortening. Decreased perimarginal enhancement from prior MRI, decreased RIGHT lateral ventricle subependymal enhancement. No hydrocephalus. No midline shift or mass effect. No abnormal extra-axial fluid collections. Minimal smooth RIGHT dural thickening consistent with prior surgery and, decreased from prior MRI. VASCULAR: Normal major  intracranial vascular flow voids present at skull base. SKULL AND UPPER CERVICAL SPINE: Old RIGHT craniotomy. No abnormal sellar expansion. No suspicious calvarial bone marrow signal. Craniocervical junction maintained. SINUSES/ORBITS: RIGHT mastoid effusion. Trace paranasal sinus mucosal thickening. The included ocular globes and orbital contents are non-suspicious. OTHER: None. IMPRESSION: 1. No acute intracranial process. 2. New nonenhancing RIGHT temporal periventricular suspected nonenhancing tumor. 3. Local treatment response: Decreased RIGHT peri-resection tumor in a background of treatment related changes. Electronically Signed: By: Elon Alas M.D. On: 11/26/2016 22:59   US Abdomen Limited Ruq  Result Date: 11/26/2016 CLINICAL DATA:  Elevated bilirubin EXAM: ULTRASOUND ABDOMEN LIMITED RIGHT UPPER QUADRANT COMPARISON:  08/22/2012 FINDINGS: Gallbladder: No gallstones or wall thickening visualized. No sonographic Murphy sign noted by sonographer. Common bile duct: Diameter: 4 mm.  Where visualized, no filling defect. Liver: No focal lesion identified. Echogenic with some sparing seen about the gallbladder fossa. No evidence of intrahepatic duct dilatation. Portal vein is patent on color Doppler imaging with normal direction of blood flow towards the liver. IMPRESSION: 1. Negative gallbladder.  No bile duct dilatation. 2. Hepatic steatosis. Electronically Signed   By: Monte Fantasia M.D.   On: 11/26/2016 08:50    Assessment:  Jessica [name] is a 61 y.o. female with glioblastoma multiforme who was admitted with altered mental status.  She is s/p resection, concurrent Temodar and radiation,  and day 15  s/p cycle #1 monthly Temodar and Avastin.  Head MRI on 11/27/2016 revealed no acute intracranial process.  There was a new nonenhancing RIGHT temporal periventricular suspected nonenhancing tumor.  There was decreased RIGHT peri-resection tumor in a background of treatment related changes.  There  was reduced diffusion associated with tumor.  There was no reduced diffusion to suggest acute ischemia  She had a recent UTI s/p ciprofloxacin.  Repeat urine culture is negative.  Symptomatically, she has had a general decline in health.  Mental status has declined.  She is unable to care for her ADLs.  Plan:   1.  Oncology:  Discuss recent head MRI results.  Concern for progressive glioblastoma with new nonenhancing RIGHT temporal periventricular tumor.  Will request comparison to [facility] on 10/08/2016.  Discussed with Dr. Gaston Islam, patient's neuro-oncologist at [facility].  Suspect patient would have had a clinical improvement after 1 month of Avastin.  Patient's performance status is extremely poor.  Patient currently not a candidate for additional chemotherapy (lomustine).  Discussed supportive care/Hospice.  Discussed code status.  Prognosis poor.  Continue Decadron 2 mg a day.  Continue Bactrim prophylaxis 3x/week for PCP prophylaxis.   2.  Neurology:  Appreciate neurology consult.  B12 extremely low.  B12 deficiency can be contributing to patient's mental sluggishness and weakness.  Anticipate B12 injections weekly x 6 then monthly (initial dose ordered).  B1 pending.  TSH normal.  Check folate.  3.  Gastroenterology:  Patient with mildy elevated bilirubin (2.0).  Fractionation in clinic was predominantly indirect (direct fraction 0.4).  RUQ ultrasound negative.  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  4.  Disposition:  Patient's husband unable to care for her.  She will need skilled nursing at discharge.  Discuss follow-up in clinic for reassessment.   Lequita Asal, MD  11/27/2016

## 2016-11-28 NOTE — Progress Notes (Signed)
Report called to RN for room 708 at [facility].Patient with no complaints at the current time. Awaiting EMS transport.

## 2016-12-02 ENCOUNTER — Ambulatory Visit: Payer: BLUE CROSS/BLUE SHIELD | Admitting: Physical Therapy

## 2016-12-11 ENCOUNTER — Telehealth: Payer: Self-pay | Admitting: Hematology and Oncology

## 2016-12-11 NOTE — Telephone Encounter (Signed)
Spoke with patients husband and they are declining the appt. Pt is currently under Hospice services.

## 2017-01-02 DEATH — deceased

## 2018-08-13 IMAGING — CT CT HEAD W/O CM
3 series · 15 of 46 positions shown, 18 images · non-contrast
Comparison: CT face [DATE]

CLINICAL DATA: Headache, nausea, photophobia for 1 day.

EXAM:
CT HEAD WITHOUT CONTRAST
TECHNIQUE: Contiguous axial images were obtained from the base of the skull
through the vertex without intravenous contrast.

[Series 2: head wo · axial · 0.41mm/px · z∈[+725,+845]mm · 9 of 29 slices shown, 12 images]
[im 3/29  brain]
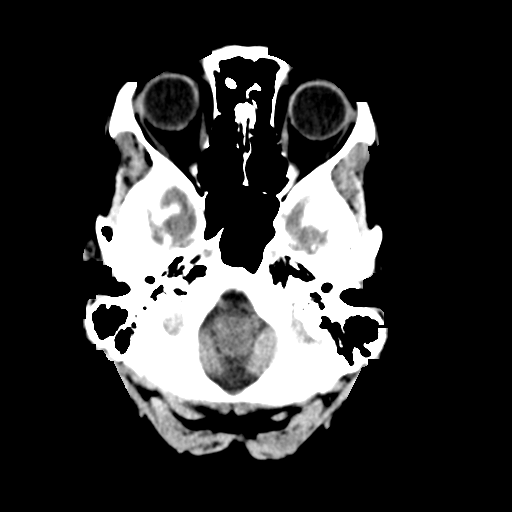
[im 3/29  bone]
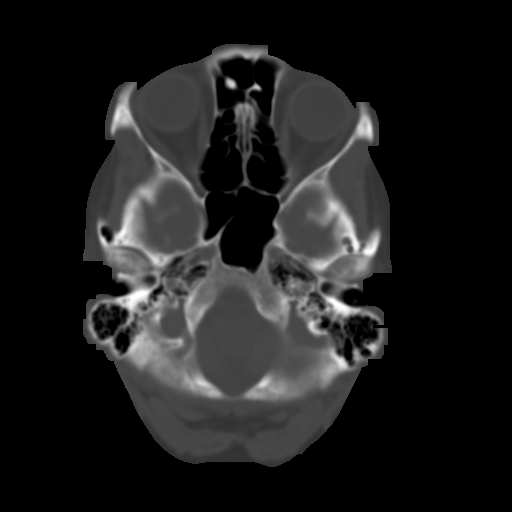
[im 6/29  brain]
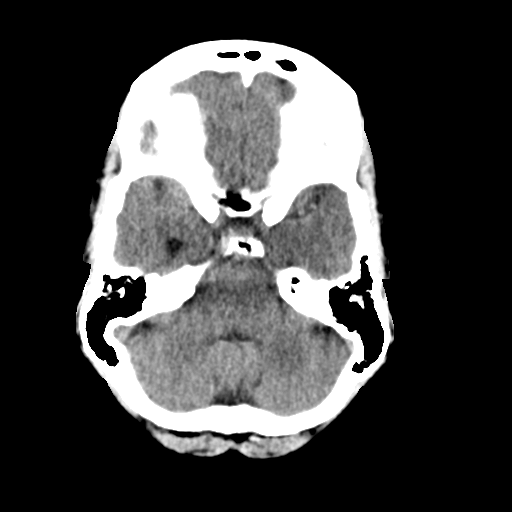
[im 9/29  brain]
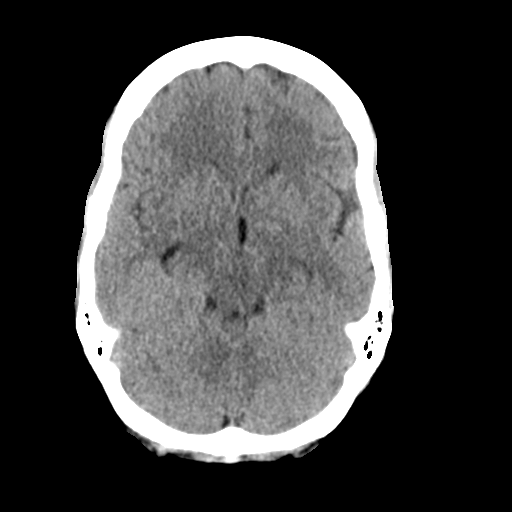
[im 12/29  brain]
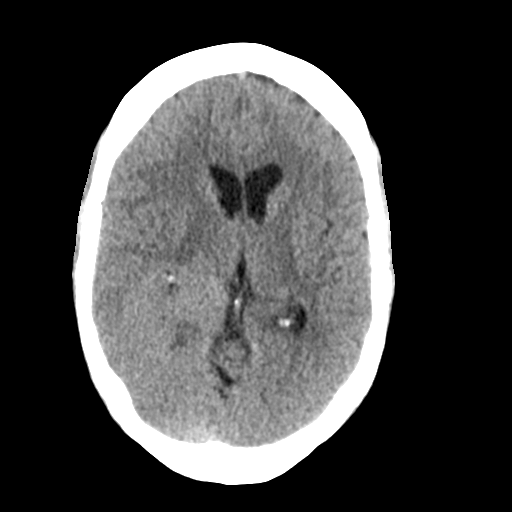
[im 15/29  brain]
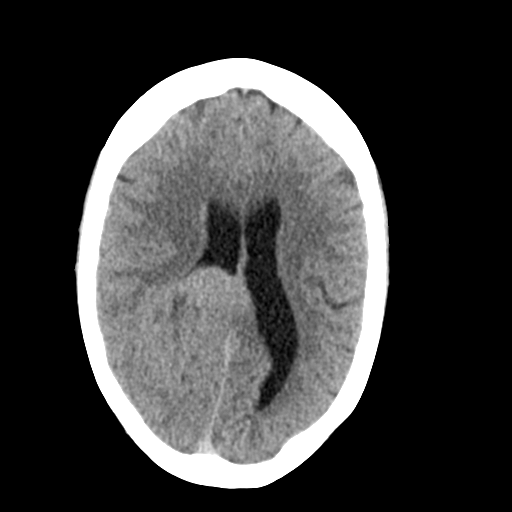
[im 15/29  bone]
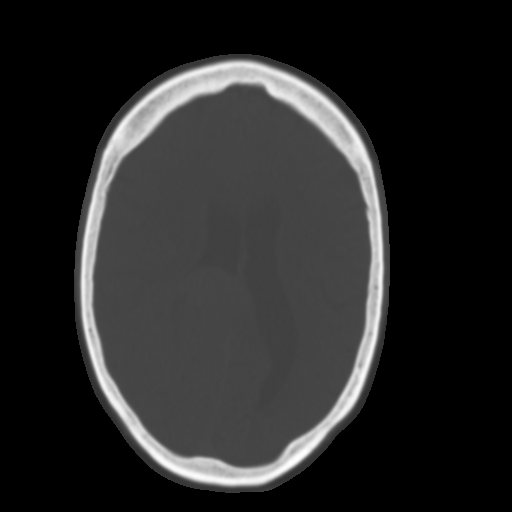
[im 18/29  brain]
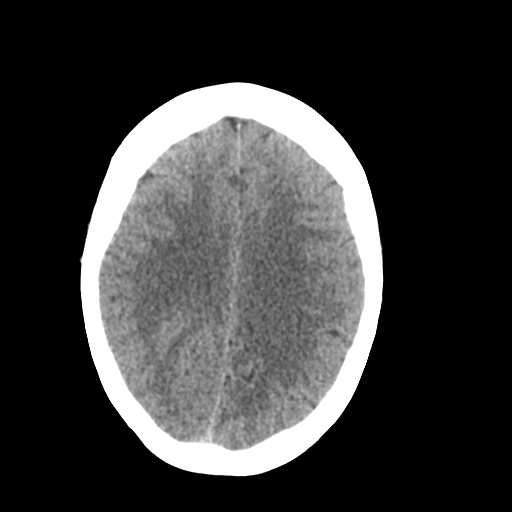
[im 21/29  brain]
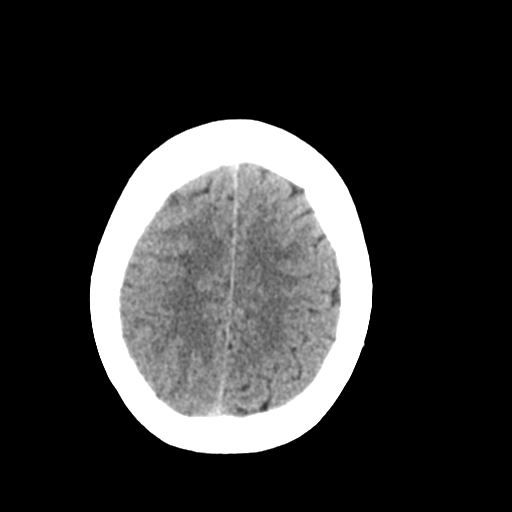
[im 24/29  brain]
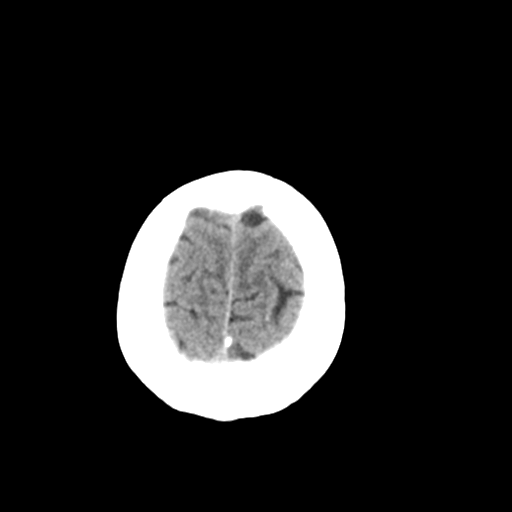
[im 27/29  brain]
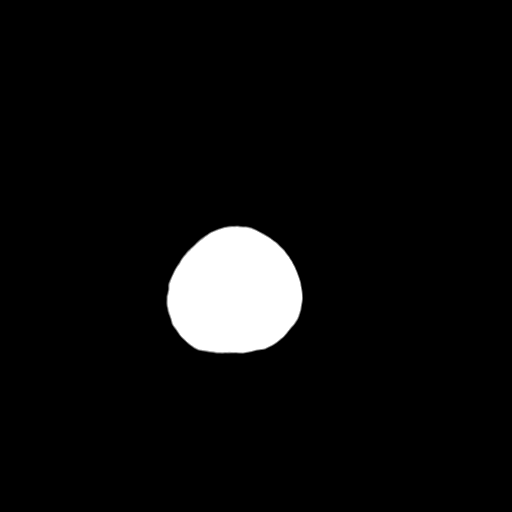
[im 27/29  bone]
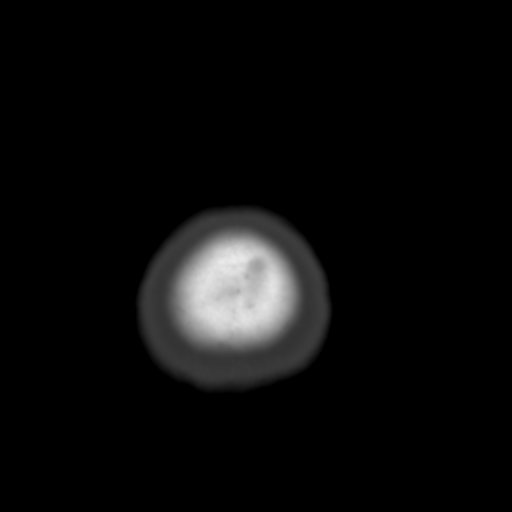

[Series 4: coronal soft tissue · coronal · 0.32mm/px · 3 of 62 slices shown]
[im 21/62  brain]
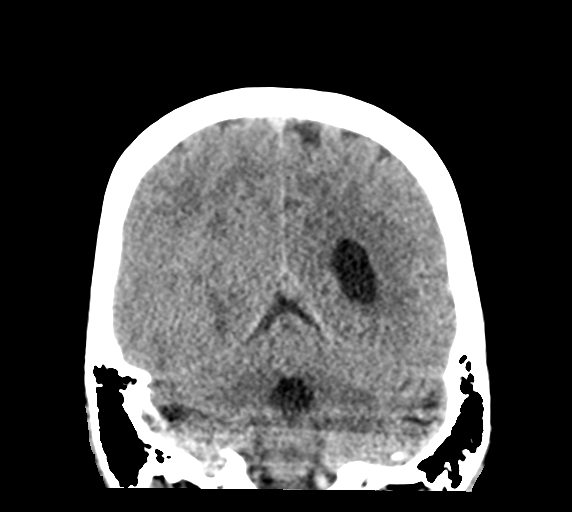
[im 28/62  brain]
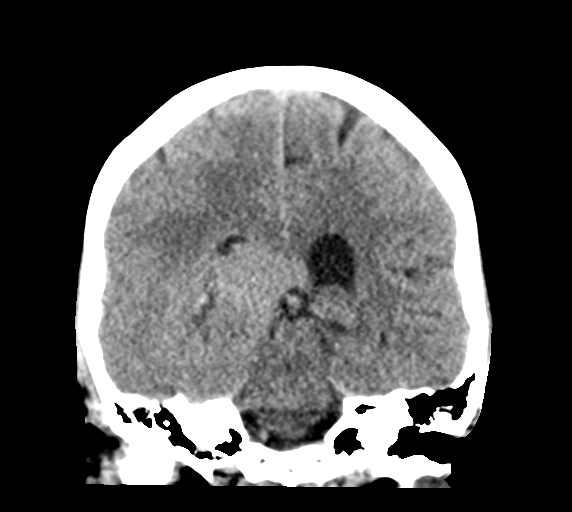
[im 34/62  brain]
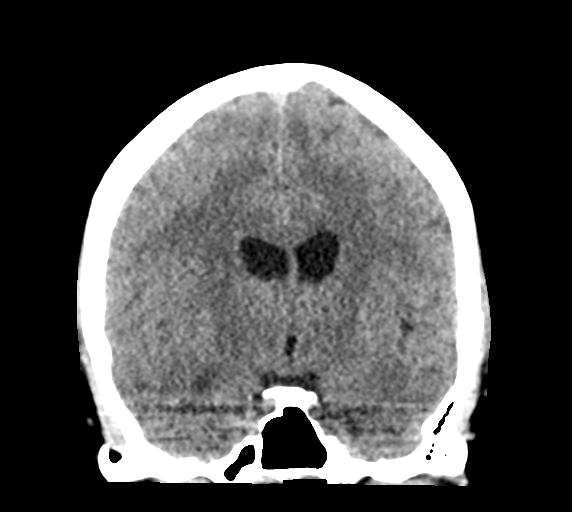

[Series 5: sagittal soft tissue · sagittal · 0.30mm/px · 3 of 45 slices shown]
[im 15/45  brain]
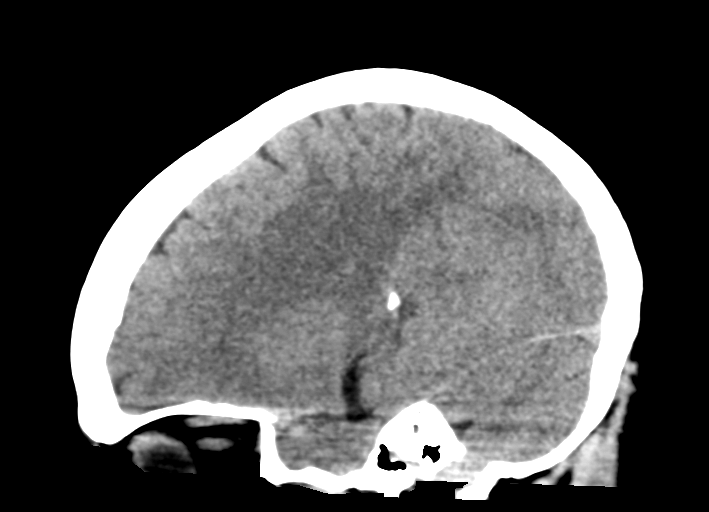
[im 23/45  brain]
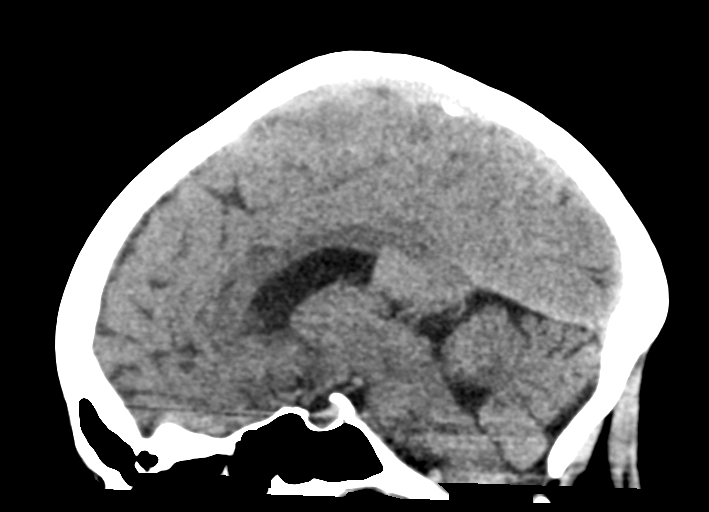
[im 30/45  brain]
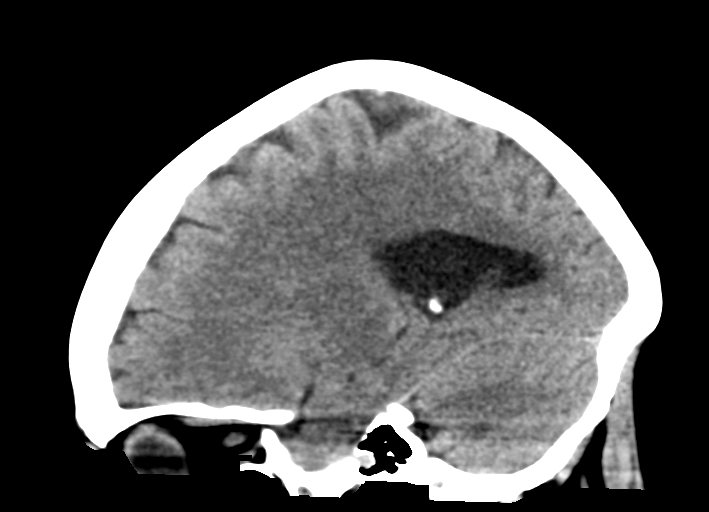

[15 of 46 positions shown; findings below may reference images not displayed]

FINDINGS: BRAIN: Intermediate to dense 4.9 x 6.7 x 5.5 cm RIGHT
parieto-occipital lobe heterogeneous mass crossing the splenium of
the corpus callosum, effacing the RIGHT lateral ventricle with RIGHT
temporal horn entrapment. No significant vasogenic edema. 7 mm RIGHT
to LEFT midline shift. No intraparenchymal hemorrhage or acute large
vascular territory infarcts. Partially effaced RIGHT basal cistern.
No abnormal extra-axial fluid collections.

VASCULAR: Moderate calcific atherosclerosis of the carotid siphons.

SKULL: No skull fracture. No significant scalp soft tissue swelling.

SINUSES/ORBITS: The mastoid air-cells and included paranasal sinuses
are well-aerated.The included ocular globes and orbital contents are
non-suspicious.

OTHER: None.
IMPRESSION: 4.9 x 6.7 x 5.5 cm RIGHT parieto-occipital mass consistent with
primary brain neoplasm. 7 mm of RIGHT to LEFT midline shift with
RIGHT temporal horn entrapment. Recommend MRI of the brain with
contrast and neurosurgical consultation.

Critical Value/emergent results were called by telephone at the time
of interpretation on 06/18/2016 at [DATE] to Dr. PIERRE WILFRANCE BAILLERGEAU ,
who verbally acknowledged these results.
